# Patient Record
Sex: Female | Born: 1965 | Race: White | Hispanic: No | Marital: Married | Smoking: Never smoker
Health system: Southern US, Community
[De-identification: ages and names within clinical notes are randomized; demographics above are authoritative.]

## PROBLEM LIST (undated history)

## (undated) DIAGNOSIS — F39 Unspecified mood [affective] disorder: Secondary | ICD-10-CM

## (undated) DIAGNOSIS — L409 Psoriasis, unspecified: Secondary | ICD-10-CM

## (undated) DIAGNOSIS — F329 Major depressive disorder, single episode, unspecified: Secondary | ICD-10-CM

## (undated) DIAGNOSIS — F988 Other specified behavioral and emotional disorders with onset usually occurring in childhood and adolescence: Secondary | ICD-10-CM

## (undated) DIAGNOSIS — L4050 Arthropathic psoriasis, unspecified: Secondary | ICD-10-CM

## (undated) DIAGNOSIS — F32A Depression, unspecified: Secondary | ICD-10-CM

## (undated) HISTORY — DX: Depression, unspecified: F32.A

## (undated) HISTORY — DX: Psoriasis, unspecified: L40.9

## (undated) HISTORY — DX: Major depressive disorder, single episode, unspecified: F32.9

## (undated) HISTORY — PX: BUNIONECTOMY: SHX129

## (undated) HISTORY — PX: ENDOMETRIAL ABLATION: SHX621

## (undated) HISTORY — DX: Arthropathic psoriasis, unspecified: L40.50

## (undated) HISTORY — DX: Other specified behavioral and emotional disorders with onset usually occurring in childhood and adolescence: F98.8

---

## 2000-08-11 HISTORY — PX: ABDOMINOPLASTY: SUR9

## 2004-06-24 ENCOUNTER — Other Ambulatory Visit: Admission: RE | Admit: 2004-06-24 | Discharge: 2004-06-24 | Payer: Self-pay | Admitting: Obstetrics and Gynecology

## 2004-10-04 ENCOUNTER — Ambulatory Visit: Payer: Self-pay | Admitting: Internal Medicine

## 2004-11-11 ENCOUNTER — Ambulatory Visit: Payer: Self-pay | Admitting: Internal Medicine

## 2004-12-24 ENCOUNTER — Ambulatory Visit: Payer: Self-pay | Admitting: Internal Medicine

## 2005-01-07 ENCOUNTER — Ambulatory Visit: Payer: Self-pay

## 2005-02-10 ENCOUNTER — Ambulatory Visit: Payer: Self-pay | Admitting: Internal Medicine

## 2005-03-12 ENCOUNTER — Ambulatory Visit (HOSPITAL_COMMUNITY): Admission: RE | Admit: 2005-03-12 | Discharge: 2005-03-12 | Payer: Self-pay | Admitting: Orthopedic Surgery

## 2005-03-12 ENCOUNTER — Ambulatory Visit (HOSPITAL_BASED_OUTPATIENT_CLINIC_OR_DEPARTMENT_OTHER): Admission: RE | Admit: 2005-03-12 | Discharge: 2005-03-12 | Payer: Self-pay | Admitting: Orthopedic Surgery

## 2005-06-02 ENCOUNTER — Ambulatory Visit: Payer: Self-pay | Admitting: Internal Medicine

## 2005-07-25 ENCOUNTER — Ambulatory Visit: Payer: Self-pay | Admitting: Family Medicine

## 2005-08-14 ENCOUNTER — Other Ambulatory Visit: Admission: RE | Admit: 2005-08-14 | Discharge: 2005-08-14 | Payer: Self-pay | Admitting: Obstetrics and Gynecology

## 2006-04-01 ENCOUNTER — Ambulatory Visit: Payer: Self-pay | Admitting: Internal Medicine

## 2006-04-22 ENCOUNTER — Emergency Department (HOSPITAL_COMMUNITY): Admission: EM | Admit: 2006-04-22 | Discharge: 2006-04-23 | Payer: Self-pay | Admitting: *Deleted

## 2006-04-24 ENCOUNTER — Ambulatory Visit: Payer: Self-pay | Admitting: Internal Medicine

## 2006-10-21 ENCOUNTER — Ambulatory Visit: Payer: Self-pay | Admitting: Internal Medicine

## 2006-10-21 LAB — CONVERTED CEMR LAB
AST: 17 units/L (ref 0–37)
Albumin: 3.8 g/dL (ref 3.5–5.2)
Alkaline Phosphatase: 35 units/L — ABNORMAL LOW (ref 39–117)
BUN: 8 mg/dL (ref 6–23)
Basophils Absolute: 0 10*3/uL (ref 0.0–0.1)
Basophils Relative: 0.5 % (ref 0.0–1.0)
CO2: 29 meq/L (ref 19–32)
Chloride: 102 meq/L (ref 96–112)
Creatinine, Ser: 0.7 mg/dL (ref 0.4–1.2)
HCT: 39.1 % (ref 36.0–46.0)
Hemoglobin: 13.5 g/dL (ref 12.0–15.0)
MCHC: 34.5 g/dL (ref 30.0–36.0)
Neutrophils Relative %: 60.7 % (ref 43.0–77.0)
RBC: 4.3 M/uL (ref 3.87–5.11)
RDW: 11.5 % (ref 11.5–14.6)
Sodium: 139 meq/L (ref 135–145)
Total Bilirubin: 0.7 mg/dL (ref 0.3–1.2)
Total Protein: 6.6 g/dL (ref 6.0–8.3)

## 2007-03-05 ENCOUNTER — Encounter: Payer: Self-pay | Admitting: Internal Medicine

## 2007-07-21 ENCOUNTER — Emergency Department (HOSPITAL_COMMUNITY): Admission: EM | Admit: 2007-07-21 | Discharge: 2007-07-21 | Payer: Self-pay | Admitting: Emergency Medicine

## 2007-09-27 ENCOUNTER — Encounter: Payer: Self-pay | Admitting: Internal Medicine

## 2007-09-28 ENCOUNTER — Ambulatory Visit: Payer: Self-pay | Admitting: Internal Medicine

## 2007-09-28 DIAGNOSIS — M949 Disorder of cartilage, unspecified: Secondary | ICD-10-CM

## 2007-09-28 DIAGNOSIS — F4321 Adjustment disorder with depressed mood: Secondary | ICD-10-CM | POA: Insufficient documentation

## 2007-09-28 DIAGNOSIS — R002 Palpitations: Secondary | ICD-10-CM | POA: Insufficient documentation

## 2007-09-28 DIAGNOSIS — L408 Other psoriasis: Secondary | ICD-10-CM | POA: Insufficient documentation

## 2007-09-28 DIAGNOSIS — M899 Disorder of bone, unspecified: Secondary | ICD-10-CM | POA: Insufficient documentation

## 2008-01-10 LAB — CONVERTED CEMR LAB: Pap Smear: NORMAL

## 2008-01-10 LAB — HM MAMMOGRAPHY: HM Mammogram: NORMAL

## 2009-10-10 ENCOUNTER — Ambulatory Visit: Payer: Self-pay | Admitting: Internal Medicine

## 2009-10-10 LAB — CONVERTED CEMR LAB
ALT: 13 units/L (ref 0–35)
BUN: 8 mg/dL (ref 6–23)
Bilirubin Urine: NEGATIVE
Bilirubin, Direct: 0 mg/dL (ref 0.0–0.3)
Blood in Urine, dipstick: NEGATIVE
Chloride: 104 meq/L (ref 96–112)
Cholesterol: 189 mg/dL (ref 0–200)
Creatinine, Ser: 0.6 mg/dL (ref 0.4–1.2)
Eosinophils Absolute: 0.2 10*3/uL (ref 0.0–0.7)
Eosinophils Relative: 5.2 % — ABNORMAL HIGH (ref 0.0–5.0)
HDL: 121.2 mg/dL (ref >39.00)
Ketones, urine, test strip: NEGATIVE
LDL Cholesterol: 50 mg/dL (ref 0–99)
MCV: 96.5 fL (ref 78.0–100.0)
Monocytes Absolute: 0.3 10*3/uL (ref 0.1–1.0)
Neutrophils Relative %: 54 % (ref 43.0–77.0)
Platelets: 179 10*3/uL (ref 150.0–400.0)
Total Bilirubin: 0.2 mg/dL — ABNORMAL LOW (ref 0.3–1.2)
Triglycerides: 87 mg/dL (ref 0.0–149.0)
Urobilinogen, UA: 0.2
VLDL: 17.4 mg/dL (ref 0.0–40.0)
WBC: 3.8 10*3/uL — ABNORMAL LOW (ref 4.5–10.5)

## 2009-10-17 ENCOUNTER — Ambulatory Visit: Payer: Self-pay | Admitting: Internal Medicine

## 2010-01-01 ENCOUNTER — Ambulatory Visit: Payer: Self-pay | Admitting: Internal Medicine

## 2010-09-08 LAB — CONVERTED CEMR LAB
Cholesterol: 165 mg/dL (ref 0–200)
Total CHOL/HDL Ratio: 2
Triglycerides: 48 mg/dL (ref 0–149)

## 2010-09-10 NOTE — Therapy (Signed)
Summary: Hearing Test/[facility]  Hearing Test/[facility]   Imported By: Maryln Gottron 10/19/2009 12:32:03  _____________________________________________________________________  External Attachment:    Type:   Image     Comment:   External Document

## 2010-09-10 NOTE — Assessment & Plan Note (Signed)
Summary: CPX/PAP/NJR   Vital Signs:  Patient profile:   45 year old female Menstrual status:  regular LMP:     08/19/2009 Height:      66.5 inches Weight:      160 pounds BMI:     25.53 Pulse rate:   72 / minute BP sitting:   120 / 80  (right arm) Cuff size:   regular  Vitals Entered By: Romualdo Bolk, CMA (AAMA) (October 17, 2009 11:02 AM) CC: CPX- No pap.  Pt has a gyn who does paps.   Hearing Screen  20db HL: Left  500 hz: 15db 1000 hz: 10db 2000 hz: 10db 4000 hz: 15db Right  500 hz: 15db 1000 hz: 10db 2000 hz: 10db 4000 hz: 10db   Hearing Testing Entered By: Romualdo Bolk, CMA [facility]) (October 17, 2009 1:56 PM) LMP (date): 08/19/2009 LMP - Character: heavy Menarche (age onset years): 14   Menses interval (days): 84 Menstrual flow (days): 7-8 Menstrual Status regular Enter LMP: 08/19/2009 Last PAP Result normal   History of Present Illness: Melissa Walters comesin today for   preventive visit .  Since last visit  here  there have been no major changes in health status  .  GYNE bleeding:  on seasonique  for bleeding  . generally helps.  MOOD:  Still fighting depression but doing much better.  Doing well on meds   doing  better on lamictal   seen in [place].   SOme joint pains  no swelling : has seen  rheum in past no dx  Psoriasis : stable   Concern about concentrating  . Dx add in past but had se of stimulants  see paper record.    hard to remember things quickly.   Preventive Care Screening  Mammogram:    Date:  01/10/2008    Results:  normal   Pap Smear:    Date:  01/10/2008    Results:  normal    Preventive Screening-Counseling & Management  Alcohol-Tobacco     Alcohol drinks/day: 3     Alcohol type: wine     Smoking Status: never  Caffeine-Diet-Exercise     Caffeine use/day: 1-2     Does Patient Exercise: yes  Hep-HIV-STD-Contraception     Dental Visit-last 6 months yes     Sun Exposure-Excessive: no  Safety-Violence-Falls  Seat Belt Use: yes     Smoke Detectors: yes  Current Medications (verified): 1)  Trazodone Hcl 100 Mg Tabs (Trazodone Hcl) .[name].. 1 By Mouth Once Daily 2)  Ambien 10 Mg Tabs (Zolpidem Tartrate) .[name].. 1 By Mouth At Bedtime 3)  Seasonale 0.15-0.03 Mg  Tabs (Levonorgest-Eth Estrad 91-Day) .[name].. 1 By Mouth Once Daily 4)  Lamictal 100 Mg Tabs (Lamotrigine) .... 2 Qd 5)  Trileptal 300 Mg Tabs (Oxcarbazepine) .[name].. 1 By Mouth Once Daily 6)  Alprazolam 0.25 Mg Tabs (Alprazolam)  Allergies (verified): No Known Drug Allergies  Past History:  Past medical, surgical, family and social histories (including risk factors) reviewed, and no changes noted (except as noted below).  Past Medical History:  poss ADD Childbirth G5P4  Psoriasis  Depression  Past Surgical History: bunion surgery bone spur left foot.   Past History:  Care Management: Gynecology: Smitty Pluck. Psychiatry: Wellington Hampshire- in [place] Dermatology: [facility]   muscle tightness  RHem deveshewar consult in the past   Family History: Reviewed history from 09/28/2007 and no changes required. no hx scd  or heart arrythmia  Anxiety Father  DM  glaucoma  MOM   Myasthenia gravis , [place]  Brother:  Devic disease NMO Neuromyelitis Optica  daughter   with Hashimotos.    Sis with psoriasis.   and depression.   Social History: Reviewed history from 09/28/2007 and no changes required. Married non smoker  no sig etoh or caffiene managing  7  hh  pet dog  Regular exercise-yes Drug use-no BA degree.Smoking Status:  never Caffeine use/day:  1-2 Seat Belt Use:  yes Dental Care w/in 6 mos.:  yes Sun Exposure-Excessive:  no  Review of Systems  The patient denies anorexia, fever, weight loss, weight gain, vision loss, hoarseness, chest pain, syncope, dyspnea on exertion, peripheral edema, prolonged cough, headaches, hemoptysis, abdominal pain, melena, hematochezia, severe indigestion/heartburn, hematuria,  incontinence, muscle weakness, transient blindness, difficulty walking, unusual weight change, abnormal bleeding, enlarged lymph nodes, angioedema, and breast masses.         ? decreased hearing compared to others Physical Exam General Appearance: well developed, well nourished, no acute distress Eyes: conjunctiva and lids normal, PERRLA, EOMI, WNL Ears, Nose, Mouth, Throat: TM clear, nares clear, oral exam WNL Neck: supple, no lymphadenopathy, no thyromegaly, no JVD Respiratory: clear to auscultation and percussion, respiratory effort normal Cardiovascular: regular rate and rhythm, S1-S2, no murmur, rub or gallop, no bruits, peripheral pulses normal and symmetric, no cyanosis, clubbing, edema or varicosities Chest: no scars, masses, tenderness; no asymmetry, skin changes, nipple discharge   Gastrointestinal: soft, non-tender; no hepatosplenomegaly, masses; active bowel sounds all quadrants,  Genitourinary: per gyne  Lymphatic: no cervical, axillary or inguinal adenopathy Musculoskeletal: gait normal, muscle tone and strength WNL, no joint swelling, effusions, discoloration, crepitus  bunion left foot   Skin: clear, good turgor, color WNL, no rashes, lesions, or ulcerations  faded psoriasis patches legs   Neurologic: normal mental status, normal reflexes, normal strength, sensation, and motion Psychiatric: alert; oriented to person, place and time Other Exam:  see labs   hearing screen is negative .     Impression & Recommendations:  Problem # 1:  HEALTH MAINTENANCE EXAM, ADULT (ICD-V70.0)  Discussed nutrition,exercise,diet,healthy weight, vitamin D and calcium.   hearing is ok   Orders: Audiometry [phone])  Problem # 2:  PSORIASIS (ICD-696.1) controlled   Problem # 3:  Family Hx of AUTOIMMUNE DISEASE NOT ELSEWHERE  CLASSIFIED (ICD-279.49) strong family hx of autoimmune  disease  no signs currently  has seen rheum in the past who agrees and says "monitor. 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 1 by mouth once daily    Alprazolam 0.25 Mg Tabs (Alprazolam)  Problem # 5:  attention concentration counsleing about this and this may be overload plus ask about meds to Psych   coaching and strategies defined . I dont think this is otherwise new disease of problem.   Complete Medication List: 1)  Trazodone Hcl 100 Mg Tabs (Trazodone hcl) .[name].. 1 by mouth once daily 2)  Ambien 10 Mg Tabs (Zolpidem tartrate) .[name].. 1 by mouth at bedtime 3)  Seasonale 0.15-0.03 Mg Tabs (Levonorgest-eth estrad 91-day) .[name].. 1 by mouth once daily 4)  Lamictal 100 Mg Tabs (Lamotrigine) .... 2 qd 5)  Trileptal 300 Mg Tabs (Oxcarbazepine) .[name].. 1 by mouth once daily 6)  Alprazolam 0.25 Mg Tabs (Alprazolam)  Other Orders:  Tdap => 28yrs IM (40981) Admin 1st Vaccine (19147)  Patient Instructions: 1)  receck if any signs are persistent and progressive   otherwise routine  check.   2)  Hearing with audiology if getting worse.    Immunizations Administered:  Tetanus Vaccine:    Vaccine Type: Tdap    Site: right deltoid    Mfr: GlaxoSmithKline    Dose: 0.5 ml    Route: IM    Given by: Romualdo Bolk, CMA (AAMA)    Exp. Date: 06/06/2010    Lot #: [id number]

## 2010-09-10 NOTE — Assessment & Plan Note (Signed)
Summary: insect bite?/dm   Vital Signs:  Patient profile:   45 year old female Menstrual status:  regular LMP:     12/11/2009 Height:      66.5 inches Weight:      165 pounds BMI:     26.33 Temp:     98.5 degrees F oral Pulse rate:   85 / minute BP sitting:   130 / 90  (right arm)  Vitals Entered By: Kathrynn Speed CMA (Jan 01, 2010 11:44 AM) CC: Inner Lft thigh Insect bite / happen 10 days ago / swelling / itching / Went to Urgent Care Yesterday LMP (date): 12/11/2009 LMP - Character: heavy Menarche (age onset years): 14   Menses interval (days): 84 Menstrual flow (days): 7-8 Enter LMP: 12/11/2009 Last PAP Result normal   History of Present Illness: Melissa Walters comes in today  for above. Was on trip to [place] and awaoke with itdy tender area left thigh .    for about ? 10 days and worse so went to urgent care yesterday and was given doxycycline for infecton .  had been using otc HCS.  has other itcthy spots behind lef t leg and on right forearm.   NO spscific exposures otherwise . No fever  .  Refill seasonale.   had tried off to see if helped her weight gain. didnt so go back on.   tender to over eat at times.  not exercising as much. otherwise ok.   Preventive Screening-Counseling & Management  Alcohol-Tobacco     Alcohol drinks/day: 3     Alcohol type: wine     Smoking Status: never  Caffeine-Diet-Exercise     Caffeine use/day: 1-2     Does Patient Exercise: yes  Current Medications (verified): 1)  Trazodone Hcl 100 Mg Tabs (Trazodone Hcl) .[name].. 1 By Mouth Once Daily 2)  Ambien 10 Mg Tabs (Zolpidem Tartrate) .[name].. 1 By Mouth At Bedtime 3)  Seasonale 0.15-0.03 Mg  Tabs (Levonorgest-Eth Estrad 91-Day) .[name].. 1 By Mouth Once Daily 4)  Lamictal 100 Mg Tabs (Lamotrigine) .... 2 Qd 5)  Trileptal 300 Mg Tabs (Oxcarbazepine) .[name].. 1 By Mouth Once Daily 6)  Alprazolam 0.25 Mg Tabs (Alprazolam) 7)  Doxycycline Hyclate 100 Mg Caps (Doxycycline Hyclate) .[name].. 1 Capsule 2 X A  Day  Allergies (verified): 1)  ! * Compezene  Past History:  Past medical, surgical, family and social histories (including risk factors) reviewed for relevance to current acute and chronic problems.  Past Medical History: Reviewed history from 10/17/2009 and no changes required.  poss ADD Childbirth G5P4  Psoriasis  Depression  Past Surgical History: Reviewed history from 10/17/2009 and no changes required. bunion surgery bone spur left foot.   Family History: Reviewed history from 10/17/2009 and no changes required. no hx scd  or heart arrythmia  Anxiety Father  DM  glaucoma  MOM   Myasthenia gravis , [place]  Brother:  Devic disease NMO Neuromyelitis Optica  daughter   with Hashimotos.    Sis with psoriasis.   and depression.   Social History: Reviewed history from 10/17/2009 and no changes required. Married non smoker  no sig etoh or caffiene managing  7  hh  pet dog  Regular exercise-yes Drug use-no BA degree.  Review of Systems  The patient denies anorexia, fever, enlarged lymph nodes, angioedema, difficulty walking, and abnormal bleeding.    Physical Exam  General:  Well-developed,well-nourished,in no acute distress; alert,appropriate and cooperative throughout examination Head:  normocephalic and atraumatic.  Neck:  No deformities, masses, or tenderness noted. Pulses:  nl cpa refill  Extremities:  no clubbing cyanosis or edema  Neurologic:  nonf ocal  Skin:  3-4 cm erythematous  ovoid with trailing linear  rash on left thigh with some faint erytjhema surrounding it  center is indurated but no abscess fluctance  . right leg with papaular urticarila tyle lesions and some linear dermatitis .  no vescices of blisters.   Psych:  Oriented X3, not anxious appearing, and subdued.     Impression & Recommendations:  Problem # 1:  ? of INSECT BITE, INFECTED (ICD-919.5) distribution  of redness is unusual and looks almost contact like    but larege area of redness  and induration noted   .  no abscess will maintain on docycycline  Problem # 2:  ? of CONTACT DERMATITIS (ICD-692.9) looks almost like pI     with linear   rashs ? if koebner  phenom but no psoriasis seen.  Her updated medication list for this problem includes:    Fluocinonide 0.05 % Crea (Fluocinonide) .[name]... Apply to rash two times a day  Problem # 3:  ORAL CONTRACEPTION (ICD-V25.41) no sig se   restart    med.    Problem # 4:  weight concerns  counseled  calendar and interventions.   Complete Medication List: 1)  Trazodone Hcl 100 Mg Tabs (Trazodone hcl) .[name].. 1 by mouth once daily 2)  Ambien 10 Mg Tabs (Zolpidem tartrate) .[name].. 1 by mouth at bedtime 3)  Seasonale 0.15-0.03 Mg Tabs (Levonorgest-eth estrad 91-day) .[name].. 1 by mouth once daily 4)  Lamictal 100 Mg Tabs (Lamotrigine) .... 2 qd 5)  Trileptal 300 Mg Tabs (Oxcarbazepine) .[name].. 1 by mouth once daily 6)  Alprazolam 0.25 Mg Tabs (Alprazolam) 7)  Doxycycline Hyclate 100 Mg Caps (Doxycycline hyclate) .[name].. 1 capsule 2 x a day 8)  Fluocinonide 0.05 % Crea (Fluocinonide) .... Apply to rash two times a day  Patient Instructions: 1)  continue  antibioitc  2)  topical cortisone to other itchy places  seems like contact dermatitis or bug bite reaction. 3)  call if fever   or as needed. Prescriptions: FLUOCINONIDE 0.05 % CREA (FLUOCINONIDE) apply to rash two times a day  #30 gm x 1   Entered and Authorized by:   Madelin Headings MD   Signed by:   Madelin Headings MD on 01/01/2010   Method used:   Electronically to        [facility]. [phone]* (retail)       [phone] [address]       [place], [place]  [address]       Ph: [phone]       Fax: [phone]   RxID:   [id number] SEASONALE 0.15-0.03 MG  TABS (LEVONORGEST-ETH ESTRAD 91-DAY) 1 by mouth once daily  #1 x 1   Entered and Authorized by:   Madelin Headings MD   Signed by:   Madelin Headings MD on 01/01/2010   Method used:   Electronically to         [facility]. [phone]* (retail)       [phone] [address].       [place], [place]  [address]       Ph: [phone]       Fax: [phone]   RxID:   [id number]

## 2010-10-02 ENCOUNTER — Other Ambulatory Visit (HOSPITAL_COMMUNITY): Payer: Self-pay

## 2010-10-03 ENCOUNTER — Ambulatory Visit (HOSPITAL_COMMUNITY)
Admission: RE | Admit: 2010-10-03 | Discharge: 2010-10-03 | Disposition: A | Discharge status: Home / Self Care | Payer: BC Managed Care – PPO | Source: Ambulatory Visit | Attending: Obstetrics and Gynecology | Admitting: Obstetrics and Gynecology

## 2010-10-03 ENCOUNTER — Other Ambulatory Visit: Payer: Self-pay | Admitting: Obstetrics and Gynecology

## 2010-10-03 DIAGNOSIS — N920 Excessive and frequent menstruation with regular cycle: Secondary | ICD-10-CM | POA: Insufficient documentation

## 2010-10-03 LAB — CBC
HCT: 37.4 % (ref 36.0–46.0)
Hemoglobin: 12.9 g/dL (ref 12.0–15.0)
MCH: 31.1 pg (ref 26.0–34.0)
MCHC: 34.5 g/dL (ref 30.0–36.0)
MCV: 90.1 fL (ref 78.0–100.0)
RDW: 11.9 % (ref 11.5–15.5)

## 2010-10-14 NOTE — Op Note (Signed)
  [name], Melissa Walters              ACCOUNT NO.:  [id number]  MEDICAL RECORD NO.:  [id number]           PATIENT TYPE:  O  LOCATION:  [facility]                          [facility]  PHYSICIAN:  Carrington Clamp, M.D. DATE OF BIRTH:  1966/02/17  DATE OF PROCEDURE:  10/03/2010 DATE OF DISCHARGE:                              OPERATIVE REPORT   PREOPERATIVE DIAGNOSIS:  Menorrhagia.  POSTOPERATIVE DIAGNOSIS:  Menorrhagia.  PROCEDURE:  Dilation and curettage with hysteroscopy and NovaSure ablation.  SURGEON:  Carrington Clamp, MD  ASSISTANT:  None.  ANESTHESIA:  General LMA.  FINDINGS:  Scant endometrium and no other abnormalities of the uterine cavity.  SPECIMENS:  Uterine curettings.  ESTIMATED BLOOD LOSS:  Minimal.  IV FLUIDS:  500 mL.  URINE OUTPUT:  Not measured.  COMPLICATIONS:  None except a single stitch of 2-0 Vicryl that was placed in the cervix to stop bleeding from the tenaculum site.  Counts were correct x3.  TECHNIQUE:  After adequate general anesthesia was achieved, the patient was prepped and draped in the usual sterile fashion in dorsal lithotomy position.  The  bladder was emptied with a red rubber catheter and a speculum was placed in the vagina.  Single-tooth tenaculum was used to stabilize the cervix.  The cervix was dilated with Shawnie Pons dilators.  The cervix was measured at 3 cm and sounding length of 9 for a cavity length of 6 cm.  The hysteroscope was passed inside the uterine cavity with minimal findings.  A curettage was then performed and the NovaSure instrument was then introduced.  A cavity width of 5 cm was achieved in a successful procedure of a power of 165 for 30 seconds was performed. The NovaSure instrument was removed and the hysteroscope was passed inside and noted that there was good contact of all the endometrial surfaces.  The instruments were drawn from the uterine cavity and single- tooth tenaculum was taken off the cervix.  There  was some bleeding of the single-tooth tenaculum site which was then taken care of with a single stitch of 2-0 Vicryl on the CT-1.  All instruments were gently withdrawn from vagina.  The patient tolerated the procedure well and was returned to recovery room in stable condition.     Carrington Clamp, M.D.     MH/MEDQ  D:  10/03/2010  T:  10/04/2010  Job:  [id number]  Electronically Signed by Carrington Clamp MD on 10/14/2010 08:28:33 AM

## 2010-12-27 NOTE — Op Note (Signed)
[name], [name]              ACCOUNT NO.:  [id number]   MEDICAL RECORD NO.:  [id number]          PATIENT TYPE:  AMB   LOCATION:  [facility]                          FACILITY:  [facility]   PHYSICIAN:  Leonides Grills, M.D.     DATE OF BIRTH:  1965/10/06   DATE OF PROCEDURE:  03/12/2005  DATE OF DISCHARGE:                                 OPERATIVE REPORT   PREOPERATIVE DIAGNOSIS:  Left hallux rigidus.   POSTOPERATIVE DIAGNOSIS:  Left hallux rigidus.   OPERATION:  Left great toe cheilectomy.   SURGEON:  Leonides Grills, M.D.   ANESTHESIA:  Allegra Lai, S.P.C.   ESTIMATED BLOOD LOSS:  Minimal.   TOURNIQUET TIME:  Approximately one-half hour.   COMPLICATIONS:  None.   DISPOSITION:  Stable to the PAR.   INDICATIONS FOR PROCEDURE:  __________  with her __________ . She was  consented for the above procedure.  All risks, which include infection,  nerve or vessel injury, persistent pain, worsening pain, stiffness or  arthritis, prolonged recovery and possible future fusion were all explained  and the questions were encouraged and answered.   DESCRIPTION OF PROCEDURE:  The patient was brought to the operating room and  placed in the supine position.  After adequate general endotracheal tube  anesthesia was administered, as well as Ancef 1 g IV piggyback, the left  lower extremity was then prepped and draped in a sterile manner over a  proximally placed thigh tourniquet.  The limb was gravity exsanguinated.  The tourniquet was elevated to 290 mmHg.  A longitudinal incision over the  dorsal aspect of the left great toe MTP joint was then made.  Dissection was  carried down through skin.  Hemostasis was obtained.  A capsulotomy was then  made just medial to the EHL tendon.  The EHL tendon sheath was now violated  with capsulitis as well.  The spur was dorsally based and there was about  half the joint that was arthritic over the metatarsal head.  There was also  a spur off the dorsal aspect of  the base of the proximal phalanx as well.  Both spurs were removed with a sagittal saw as well as a rongeur.  Once the  area was copiously irrigated with normal saline and dried off, bone wax was  applied to  exposed bony surfaces.  The capsule was closed with #3-0  Vicryl sutures.  The tourniquet was deflated.  Hemostasis was obtained.  The  skin was closed with #4-0 nylon sutures.  A sterile dressing was applied.  Hard soled shoes were applied.   The patient was sterile to the PAR.       PB/MEDQ  D:  03/12/2005  T:  03/12/2005  Job:  [id number]

## 2011-08-14 ENCOUNTER — Other Ambulatory Visit: Payer: Self-pay | Admitting: Dermatology

## 2011-08-26 ENCOUNTER — Encounter: Payer: Self-pay | Admitting: Internal Medicine

## 2011-09-02 ENCOUNTER — Other Ambulatory Visit (INDEPENDENT_AMBULATORY_CARE_PROVIDER_SITE_OTHER): Payer: BC Managed Care – PPO

## 2011-09-02 DIAGNOSIS — Z0000 Encounter for general adult medical examination without abnormal findings: Secondary | ICD-10-CM

## 2011-09-02 LAB — HEPATIC FUNCTION PANEL
ALT: 14 U/L (ref 0–35)
AST: 19 U/L (ref 0–37)
Alkaline Phosphatase: 70 U/L (ref 39–117)
Total Bilirubin: 0.4 mg/dL (ref 0.3–1.2)

## 2011-09-02 LAB — CBC WITH DIFFERENTIAL/PLATELET
Basophils Relative: 1.1 % (ref 0.0–3.0)
Lymphocytes Relative: 32 % (ref 12.0–46.0)
MCHC: 34.6 g/dL (ref 30.0–36.0)
MCV: 93.8 fl (ref 78.0–100.0)
Monocytes Absolute: 0.5 10*3/uL (ref 0.1–1.0)
Neutrophils Relative %: 51.3 % (ref 43.0–77.0)
RBC: 3.88 Mil/uL (ref 3.87–5.11)
RDW: 12.7 % (ref 11.5–14.6)

## 2011-09-02 LAB — POCT URINALYSIS DIPSTICK
Glucose, UA: NEGATIVE
Leukocytes, UA: NEGATIVE
Nitrite, UA: POSITIVE
Spec Grav, UA: 1.02
Urobilinogen, UA: 0.2

## 2011-09-02 LAB — BASIC METABOLIC PANEL
BUN: 13 mg/dL (ref 6–23)
CO2: 27 mEq/L (ref 19–32)
Calcium: 9.1 mg/dL (ref 8.4–10.5)
Chloride: 98 mEq/L (ref 96–112)
Creatinine, Ser: 0.7 mg/dL (ref 0.4–1.2)
Glucose, Bld: 88 mg/dL (ref 70–99)

## 2011-09-02 LAB — LIPID PANEL
Cholesterol: 216 mg/dL — ABNORMAL HIGH (ref 0–200)
Total CHOL/HDL Ratio: 2

## 2011-09-03 LAB — LDL CHOLESTEROL, DIRECT: Direct LDL: 59.9 mg/dL

## 2011-09-09 ENCOUNTER — Encounter: Payer: Self-pay | Admitting: Internal Medicine

## 2011-09-09 ENCOUNTER — Ambulatory Visit (INDEPENDENT_AMBULATORY_CARE_PROVIDER_SITE_OTHER): Payer: BC Managed Care – PPO | Admitting: Internal Medicine

## 2011-09-09 VITALS — BP 122/80 | HR 84 | Ht 66.5 in | Wt 176.0 lb

## 2011-09-09 DIAGNOSIS — F32A Depression, unspecified: Secondary | ICD-10-CM | POA: Insufficient documentation

## 2011-09-09 DIAGNOSIS — F329 Major depressive disorder, single episode, unspecified: Secondary | ICD-10-CM

## 2011-09-09 DIAGNOSIS — Z0000 Encounter for general adult medical examination without abnormal findings: Secondary | ICD-10-CM

## 2011-09-09 DIAGNOSIS — L409 Psoriasis, unspecified: Secondary | ICD-10-CM | POA: Insufficient documentation

## 2011-09-09 NOTE — Progress Notes (Signed)
Subjective:    Patient ID: Melissa Walters, female    DOB: 19-Jul-1966, 46 y.o.   MRN: [id number]  HPI Patient comes in today for Preventive Health Care visit  Since last visit Had left bunion surgery. Getting personal trainer and exercising but hard time losing weight tends to miss breakfast and  Lunch busy family life . Needs form for  Insurance  No OSA sometime snores.   Review of Systems ROS:  GEN/ HEENTNo fever, significant weight changes sweats headaches vision problems hearing slight decrease changes, CV/ PULM; No chest pain shortness of breath cough, syncope,edema  change in exercise tolerance. GI /GU: No adominal pain, vomiting, change in bowel habits. No blood in the stool. No significant GU symptoms. SKIN/HEME: ,no acute skin rashes suspicious lesions or bleeding. No lymphadenopathy, nodules, masses. Sees  Dr Danella Deis for psoriasis. NEURO/ PSYCH:  No neurologic signs such as weakness numbness IMM/ Allergy: No unusual infections.  Allergy .   REST of 12 system review negative   Past history family history social history reviewed in the electronic medical record. Past Medical History  Diagnosis Date  . ADD (attention deficit disorder)     poss  . Depression   . Psoriasis     History   Social History  . Marital Status: Married    Spouse Name: N/A    Number of Children: N/A  . Years of Education: N/A   Occupational History  . Not on file.   Social History Main Topics  . Smoking status: Never Smoker   . Smokeless tobacco: Not on file  . Alcohol Use: No  . Drug Use: Yes  . Sexually Active: Not on file   Other Topics Concern  . Not on file   Social History Narrative   MarriedNo caffeineManaging 7 HH Pet dogRegular exercise- yesBA degree    Past Surgical History  Procedure Date  . Bunionectomy     bone spur left foot    Family History  Problem Relation Age of Onset  . Anxiety disorder    . Diabetes Father   . Glaucoma Father   . Myasthenia gravis  Mother   . Other Mother     [place]  . Other Brother     devic disease NMO neuromyelitis optica  . Hashimoto's thyroiditis Daughter   . Psoriasis Sister   . Depression Sister     Allergies  Allergen Reactions  . Compazine Swelling    Throat swelling    Current Outpatient Prescriptions on File Prior to Visit  Medication Sig Dispense Refill  . lamoTRIgine (LAMICTAL) 100 MG tablet Take 200 mg by mouth daily.      . Oxcarbazepine (TRILEPTAL) 300 MG tablet Take 300 mg by mouth daily.      . traZODone (DESYREL) 100 MG tablet Take 100 mg by mouth at bedtime.      [name] zolpidem (AMBIEN) 10 MG tablet Take 10 mg by mouth at bedtime as needed.        BP 122/80  Pulse 84  Ht 5' 6.5" (1.689 m)  Wt 176 lb (79.833 kg)  BMI 27.98 kg/m2        Objective:   Physical Exam Physical Exam: Vital signs reviewed WUJ:WJXB is a well-developed well-nourished alert cooperative  white female who appears her stated age in no acute distress.  HEENT: normocephalic atraumatic , Eyes: PERRL EOM's full, conjunctiva clear, Nares: paten,t no deformity discharge or tenderness., Ears: no deformity EAC's clear TMs with normal landmarks. Mouth: clear OP,  no lesions, edema.  Moist mucous membranes. Dentition in adequate repair. NECK: supple without masses, thyromegaly or bruits. CHEST/PULM:  Clear to auscultation and percussion breath sounds equal no wheeze , rales or rhonchi. No chest wall deformities or tenderness. CV: PMI is nondisplaced, S1 S2 no gallops, murmurs, rubs. Peripheral pulses are full without delay.No JVD .  ABDOMEN: Bowel sounds normal nontender  No guard or rebound, no hepato splenomegal no CVA tenderness.  No hernia. Extremtities:  No clubbing cyanosis or edema, no acute joint swelling or redness no focal atrophy NEURO:  Oriented x3, cranial nerves 3-12 appear to be intact, no obvious focal weakness,gait within normal limits no abnormal reflexes or asymmetrical SKIN: No acute rashes normal turgor,  color, no bruising or petechiae. Small patch psoriasis leg  PSYCH: Oriented, good eye contact, no obvious depression anxiety, cognition and judgment appear normal. LN: no cervical axillary inguinal adenopathy Lab Results  Component Value Date   WBC 5.0 09/02/2011   HGB 12.6 09/02/2011   HCT 36.3 09/02/2011   PLT 165.0 09/02/2011   GLUCOSE 88 09/02/2011   CHOL 216* 09/02/2011   TRIG 54.0 09/02/2011   HDL 127.90 09/02/2011   LDLDIRECT 59.9 09/02/2011   LDLCALC 50 10/10/2009   ALT 14 09/02/2011   AST 19 09/02/2011   NA 134* 09/02/2011   K 4.7 09/02/2011   CL 98 09/02/2011   CREATININE 0.7 09/02/2011   BUN 13 09/02/2011   CO2 27 09/02/2011   TSH 1.00 09/02/2011         Assessment & Plan:  Preventive Health Care Counseled regarding healthy nutrition, exercise, sleep, injury prevention, calcium vit d and healthy weight .  Form signed for  [facility].  Flu shot Depression under rx   Stable  Psoriasis   Under care no ob complications Concern about  Weight. Counseled.    ADD NOTE : Pt was  Mistakenly given tdap instead of flu shot.by  CMA.   Flu shot given other arm given also by CMA . Told patient that she may have and increase reaction to the arm but should be safe otherwise. Call us if any concerns about this.   Lorretta Harp MD

## 2011-09-09 NOTE — Patient Instructions (Signed)
Continue lifestyle intervention healthy eating and exercise . Your labs are good. Sleep hygiene and dont skip breakfast lunch intake can be  200 calorie with protein content.  3500 calories is the energy content of a pound of body weight .Must have a 3500 cal deficit to lose one pound . Thus decrease 500 calorie equivalent per day in food or drink intake / or exercise  for 7 days to lose one pound.

## 2012-09-13 ENCOUNTER — Telehealth: Payer: Self-pay | Admitting: Internal Medicine

## 2012-09-13 NOTE — Telephone Encounter (Signed)
It is better for me to see her first and do labs at the [facility] because lab testing related to  arthritis is not very precise . There are many tests to consider .  Sometimes we get rheumatology opinion.

## 2012-09-13 NOTE — Telephone Encounter (Signed)
Patient notified by telephone. 

## 2012-09-13 NOTE — Telephone Encounter (Signed)
Pt states she is having severe arthritic pain.  Appt scheduled to see PCP Thursday 09/16/12.  However pt would like to have labs done prior to appt.  Requesting order to check for rheumatoid arthritis.

## 2012-09-16 ENCOUNTER — Encounter: Payer: Self-pay | Admitting: Internal Medicine

## 2012-09-16 ENCOUNTER — Ambulatory Visit (INDEPENDENT_AMBULATORY_CARE_PROVIDER_SITE_OTHER): Payer: BC Managed Care – PPO | Admitting: Internal Medicine

## 2012-09-16 VITALS — BP 126/86 | HR 82 | Temp 98.4°F | Wt 172.0 lb

## 2012-09-16 DIAGNOSIS — Z299 Encounter for prophylactic measures, unspecified: Secondary | ICD-10-CM

## 2012-09-16 DIAGNOSIS — M2550 Pain in unspecified joint: Secondary | ICD-10-CM | POA: Insufficient documentation

## 2012-09-16 DIAGNOSIS — L408 Other psoriasis: Secondary | ICD-10-CM

## 2012-09-16 DIAGNOSIS — M2560 Stiffness of unspecified joint, not elsewhere classified: Secondary | ICD-10-CM

## 2012-09-16 DIAGNOSIS — L409 Psoriasis, unspecified: Secondary | ICD-10-CM

## 2012-09-16 LAB — CBC WITH DIFFERENTIAL/PLATELET
Basophils Relative: 1.6 % (ref 0.0–3.0)
Hemoglobin: 12.2 g/dL (ref 12.0–15.0)
Lymphocytes Relative: 30.7 % (ref 12.0–46.0)
Monocytes Relative: 10.2 % (ref 3.0–12.0)
Neutro Abs: 2 10*3/uL (ref 1.4–7.7)
Neutrophils Relative %: 51.7 % (ref 43.0–77.0)
RBC: 3.87 Mil/uL (ref 3.87–5.11)
WBC: 4 10*3/uL — ABNORMAL LOW (ref 4.5–10.5)

## 2012-09-16 LAB — BASIC METABOLIC PANEL
CO2: 28 mEq/L (ref 19–32)
Calcium: 8.9 mg/dL (ref 8.4–10.5)
Creatinine, Ser: 0.9 mg/dL (ref 0.4–1.2)
GFR: 75.79 mL/min (ref >60.00)
Sodium: 130 mEq/L — ABNORMAL LOW (ref 135–145)

## 2012-09-16 LAB — HEPATIC FUNCTION PANEL
AST: 15 U/L (ref 0–37)
Albumin: 4.2 g/dL (ref 3.5–5.2)
Alkaline Phosphatase: 78 U/L (ref 39–117)
Bilirubin, Direct: 0 mg/dL (ref 0.0–0.3)
Total Protein: 6.9 g/dL (ref 6.0–8.3)

## 2012-09-16 LAB — IBC PANEL
Iron: 62 ug/dL (ref 42–145)
Transferrin: 266.5 mg/dL (ref 212.0–360.0)

## 2012-09-16 LAB — TSH: TSH: 0.28 u[IU]/mL — ABNORMAL LOW (ref 0.35–5.50)

## 2012-09-16 LAB — LIPID PANEL: VLDL: 13 mg/dL (ref 0.0–40.0)

## 2012-09-16 LAB — FERRITIN: Ferritin: 59.4 ng/mL (ref 10.0–291.0)

## 2012-09-16 LAB — LDL CHOLESTEROL, DIRECT: Direct LDL: 51 mg/dL

## 2012-09-16 NOTE — Patient Instructions (Signed)
Will arrange   Updated opinion about the joint stiffness  . Will notify you  of labs when available. Uncertain if this is an inflammatory process or degenerative and ? If psoriatic arthritis could be a cause or not.  You will be contacted about referral appt.

## 2012-09-16 NOTE — Progress Notes (Signed)
Chief Complaint  Patient presents with  . Joint Pain    Ongoing for awhile.  Pain has increased lately.  She is noticing changes in the way her fingers and toes look.    HPI: Patient comes in today for   Worsening problem evaluation. Joint pains for about 6 years had RA  Evaluation cause at that time had borderline number  Pre electronic . Was felt to be no inflammatory a problem But since that time  Noted more stiffness  Am and most of day   Hand stiffness and throught out hard to hold ski pole.  Knees and neck.  A problem No fever.  No acute swelling but enlargening of pip joint areas   No numbness  Family hx arthitis both parents   ?DJD.    Has psoriasis on scalp. And gets in winter  And legs.  Sees derm  Had  Bunion toe fusion surgery last year  Left foot and noted advanced arthritis  . Per surgeon dr hewitt  ROS: See pertinent positives and negatives per HPI. No colitis sx gi conditions .  Some weight gain recently she is addressing from holidays.  Neg fam celiac.   Past Medical History  Diagnosis Date  . ADD (attention deficit disorder)     poss  . Depression   . Psoriasis     Family History  Problem Relation Age of Onset  . Anxiety disorder    . Diabetes Father   . Glaucoma Father   . Myasthenia gravis Mother   . Other Mother     [place]  . Other Brother     devic disease NMO neuromyelitis optica  . Hashimoto's thyroiditis Daughter   . Psoriasis Sister   . Depression Sister   . Arthritis      parents     History   Social History  . Marital Status: Married    Spouse Name: N/A    Number of Children: N/A  . Years of Education: N/A   Social History Main Topics  . Smoking status: Never Smoker   . Smokeless tobacco: None  . Alcohol Use: No  . Drug Use: Yes  . Sexually Active: None   Other Topics Concern  . None   Social History Narrative   MarriedNo caffeineManaging 7 HH Pet dogRegular exercise- yesBA degree    Outpatient Encounter Prescriptions as of  09/16/2012  Medication Sig Dispense Refill  . lamoTRIgine (LAMICTAL) 100 MG tablet Take 200 mg by mouth daily.      . Oxcarbazepine (TRILEPTAL) 300 MG tablet Take 300 mg by mouth daily.      . traZODone (DESYREL) 100 MG tablet Take 100 mg by mouth at bedtime.      [name] zolpidem (AMBIEN) 10 MG tablet Take 10 mg by mouth at bedtime as needed.        EXAM:  BP 126/86  Pulse 82  Temp 98.4 F (36.9 C) (Oral)  Wt 172 lb (78.019 kg)  SpO2 97%  There is no height on file to calculate BMI.  GENERAL: vitals reviewed and listed above, alert, oriented, appears well hydrated and in no acute distress  HEENT: atraumatic, conjunctiva  clear, no obvious abnormalities on inspection of external nose and ears OP : no lesion edema or exudate  NECK: no obvious masses on inspection palpation  LUNGS: clear to auscultation bilaterally, no wheezes, rales or rhonchi, good air movement CV: HRRR, no clubbing cyanosis or  peripheral edema nl cap refill  Skin  Few psoriasis patche son legs   MS: moves all extremities  Hands  Mild inc pip right hand not really a sausage digit  Feet no redness scar left bunion area toes some deformity and separation . [place].   PSYCH: pleasant and cooperative,   ASSESSMENT AND PLAN:  Discussed the following assessment and plan:  1. Morning joint stiffness  Basic metabolic panel, CBC with Differential, Hepatic function panel, IBC panel, Ferritin, ANA, Cyclic citrul peptide antibody, IgG, Sedimentation rate  2. Psoriasis  Basic metabolic panel, CBC with Differential, Hepatic function panel, IBC panel, Ferritin, ANA, Cyclic citrul peptide antibody, IgG, Sedimentation rate  3. Multiple joint pain  Basic metabolic panel, CBC with Differential, Hepatic function panel, IBC panel, Ferritin, ANA, Cyclic citrul peptide antibody, IgG, Sedimentation rate  4. Preventive measure  Basic metabolic panel, CBC with Differential, Hepatic function panel, Lipid panel, TSH, IBC panel, Ferritin, ANA,  Cyclic citrul peptide antibody, IgG, Sedimentation rate    -Patient advised to return or notify health care team  if symptoms worsen or persist or new concerns arise.  Patient Instructions  Will arrange   Updated opinion about the joint stiffness  . Will notify you  of labs when available. Uncertain if this is an inflammatory process or degenerative and ? If psoriatic arthritis could be a cause or not.  You will be contacted about referral appt.     Melissa Walters. Melissa Walters M.D.

## 2012-09-17 LAB — ANA: Anti Nuclear Antibody(ANA): NEGATIVE

## 2012-09-21 ENCOUNTER — Telehealth: Payer: Self-pay | Admitting: Internal Medicine

## 2012-09-21 DIAGNOSIS — R7989 Other specified abnormal findings of blood chemistry: Secondary | ICD-10-CM

## 2012-09-21 NOTE — Telephone Encounter (Signed)
Pt requesting results of labs.

## 2012-09-21 NOTE — Telephone Encounter (Signed)
See result note  inform pt that labs are ok and negative arhtritis serology but her thyroid test is abnormal and may show  Over activity .  Still see the rheumatologist .  And  Repeat   tsh with free t4 free t3  thyroid stimulating antibodies  . And thyroid antibody panel.   Dx abnormal tsh

## 2012-09-22 ENCOUNTER — Other Ambulatory Visit: Payer: Self-pay | Admitting: Internal Medicine

## 2012-09-22 DIAGNOSIS — R7989 Other specified abnormal findings of blood chemistry: Secondary | ICD-10-CM

## 2012-09-22 NOTE — Telephone Encounter (Signed)
Patient notified by telephone. 

## 2012-09-29 ENCOUNTER — Other Ambulatory Visit (INDEPENDENT_AMBULATORY_CARE_PROVIDER_SITE_OTHER): Payer: BC Managed Care – PPO

## 2012-09-29 ENCOUNTER — Telehealth: Payer: Self-pay | Admitting: Internal Medicine

## 2012-09-29 DIAGNOSIS — R232 Flushing: Secondary | ICD-10-CM

## 2012-09-29 DIAGNOSIS — N951 Menopausal and female climacteric states: Secondary | ICD-10-CM

## 2012-09-29 LAB — FOLLICLE STIMULATING HORMONE: FSH: 71.1 m[IU]/mL

## 2012-09-29 LAB — TSH: TSH: 0.41 u[IU]/mL (ref 0.35–5.50)

## 2012-09-29 LAB — T3, FREE: T3, Free: 2.2 pg/mL — ABNORMAL LOW (ref 2.3–4.2)

## 2012-09-29 NOTE — Telephone Encounter (Signed)
Pt would like premenopausal test if one is available, since she is having labs done anyway today. Pls advise.

## 2012-09-30 LAB — PROLACTIN: Prolactin: 9.1 ng/mL

## 2012-10-09 DIAGNOSIS — L4050 Arthropathic psoriasis, unspecified: Secondary | ICD-10-CM

## 2012-10-09 HISTORY — DX: Arthropathic psoriasis, unspecified: L40.50

## 2012-10-14 ENCOUNTER — Telehealth: Payer: Self-pay | Admitting: Internal Medicine

## 2012-10-14 NOTE — Telephone Encounter (Signed)
Please call pt about lab results on 2/19.

## 2012-10-16 NOTE — Telephone Encounter (Signed)
pls advise

## 2012-10-16 NOTE — Telephone Encounter (Signed)
Report what is in the result note to her .  Thanks

## 2012-10-18 ENCOUNTER — Other Ambulatory Visit: Payer: Self-pay | Admitting: Family Medicine

## 2012-10-18 DIAGNOSIS — R7989 Other specified abnormal findings of blood chemistry: Secondary | ICD-10-CM

## 2012-10-18 NOTE — Telephone Encounter (Signed)
Patient notified of results by telephone. 

## 2012-11-05 ENCOUNTER — Ambulatory Visit: Payer: BC Managed Care – PPO | Admitting: Internal Medicine

## 2012-11-09 ENCOUNTER — Ambulatory Visit (INDEPENDENT_AMBULATORY_CARE_PROVIDER_SITE_OTHER): Payer: BC Managed Care – PPO | Admitting: Internal Medicine

## 2012-11-09 ENCOUNTER — Encounter: Payer: Self-pay | Admitting: Internal Medicine

## 2012-11-09 VITALS — BP 122/82 | HR 65 | Temp 98.1°F | Resp 10 | Ht 67.0 in | Wt 179.0 lb

## 2012-11-09 DIAGNOSIS — R7989 Other specified abnormal findings of blood chemistry: Secondary | ICD-10-CM

## 2012-11-09 DIAGNOSIS — R6889 Other general symptoms and signs: Secondary | ICD-10-CM

## 2012-11-09 NOTE — Progress Notes (Signed)
Subjective:     Patient ID: Melissa Walters, female   DOB: 1966-03-31, 47 y.o.   MRN: [id number]  HPI Melissa Walters is a pleasant 47 year old woman, referred by her PCP, Dr. Fabian Sharp, for a low TSH on 09/16/2012.  [name], since 2008, however, 2 months ago, [name], of 0.28. A repeat set of thyroid labs on 09/29/2012, [name], [name] Of note, her prolactin was normal at that time at 9.1, and she had a high FSH, at 71.  TSH  Date Value Range Status  09/29/2012 0.41  0.35 - 5.50 uIU/mL Final  09/16/2012 0.28* 0.35 - 5.50 uIU/mL Final  09/02/2011 1.00  0.35 - 5.50 uIU/mL Final  10/10/2009 0.63  0.35-5.50 microintl units/mL Final  09/28/2007 0.59  0.35-5.50 microintl units/mL Final  10/21/2006 0.55  0.35-5.50 microintl units/mL Final  Denies po 6 or intra-articular steroids, no increased iodine in diet, no recent contrast studies. 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  She has weight gain (35-40 lbs in the past 5 years, since started the psychotropic medications; but had lost 35 lbs before; she again lost 20 lbs last year, before the holidays), also has fatigue and hair falling. She does have scalp psoriasis, which has improved recently after she was on a protein diet before the holidays. She exercises daily, walking 3x a week and and doing "boot camp" 2x a week. Patient denies palpitations, tremors, increased his stool numbers or other signs of hyperthyroidism. She does admit to anxiety and depression, which are not new.  Daughter has Hashimoto's, dx at 49 y/o. Her sister also have hypothyroidism, not on meds. She also has psoriatic arthritis. Has FH of MS, Devic's ds., psoriasis, Myastenia Gravis.   I reviewed her chart including office notes, telephone notes, labs, and available scanned  records. Of note, patient had hyponatremia at 130 on 09/16/2012, previously 134, with otherwise normal BMP (probably caused by Trileptal +/- Lamictal). Potassium level was normal, at 4.3. Her chloride was 97 (96-112). Her eosinophiles were also high, at 5.8% (0-5%) and have been previously high at 5.5% and 5.2% dating back 3 years ago. Her LFTs are normal. Her cholesterol level was 214/65/134/61, ESR 9 per last check.  Review of Systems Constitutional: + weight gain, + fatigue, + hot flashes, plus increased appetite, plus poor sleep Eyes: no blurry vision, no xerophthalmia ENT: no sore throat, no nodules palpated in throat, no dysphagia/odynophagia, no hoarseness Cardiovascular: no CP/SOB/palpitations/leg swelling Respiratory: + cough/no SOB Gastrointestinal: no N/V/D/+C, + GERD Musculoskeletal: has muscle/joint aches Skin: no rashes, + hair loss Neurological: no tremors/numbness/tingling/dizziness, occasional headaches Psychiatric: no depression/anxiety Low libido  Past Medical History  Diagnosis Date  . ADD (attention deficit disorder)     poss  . Depression   . Psoriasis   . Psoriatic arthritis 10/2012   Past Surgical History  Procedure Laterality Date  . Bunionectomy      bone spur left foot  . Endometrial ablation     History   Social History  . Marital Status: Married    Spouse Name: N/A    Number of Children: 4: 64, 45, 38, 70 y/o  . Years of Education: N/A   Occupational History  . homemaker   Social History Main Topics  . Smoking status: Never Smoker   .  Smokeless tobacco: Not on file  . Alcohol Use: Yes, wine, most days, 2-3 drinks  . Drug Use: no   Social History Narrative   Married   No caffeine   Managing 7 HH    Pet dog   Regular exercise- yes   BA degree   Current Outpatient Prescriptions on File Prior to Visit  Medication Sig Dispense Refill  . lamoTRIgine (LAMICTAL) 100 MG tablet Take 200 mg by mouth daily.      . Oxcarbazepine (TRILEPTAL) 300  MG tablet Take 300 mg by mouth daily.      . traZODone (DESYREL) 100 MG tablet Take 100 mg by mouth at bedtime.      [name] zolpidem (AMBIEN) 10 MG tablet Take 10 mg by mouth at bedtime as needed.       No current facility-administered medications on file prior to visit.   Allergies  Allergen Reactions  . Compazine Swelling    Throat swelling   Family History  Problem Relation Age of Onset  . Anxiety disorder    . Diabetes Father   . Glaucoma Father   . Myasthenia gravis Mother   . Other Mother     [place]  . Other Brother     devic disease NMO neuromyelitis optica  . Hashimoto's thyroiditis Daughter   . Psoriasis Sister   . Depression Sister   . Arthritis      parents    Objective:   Physical Exam BP 122/82  Pulse 65  Temp(Src) 98.1 F (36.7 C) (Oral)  Resp 10  Ht 5\' 7"  (1.702 m)  Wt 179 lb (81.194 kg)  BMI 28.03 kg/m2  SpO2 97% Constitutional: overweight, in NAD Eyes: PERRLA, EOMI, no exophthalmos ENT: moist mucous membranes, no thyromegaly, no cervical lymphadenopathy Cardiovascular: RRR, No MRG Respiratory: CTA B Gastrointestinal: abdomen soft, NT, ND, BS+ Musculoskeletal: no deformities, strength intact in all 4 Skin: moist, warm, no rashes Neurological: no tremor with outstretched hands, DTR normal in all 4 Wt Readings from Last 3 Encounters:  11/09/12 179 lb (81.194 kg)  09/16/12 172 lb (78.019 kg)  09/09/11 176 lb (79.833 kg)   Assessment:     1. Low TSH value - x1, resolved - normal fT4  - on Oxcarbazepine (Trileptal)  2. Hyponatremia - Likely from Trileptal +/- Lamictal    Plan:     I had a long discussion with the patient about possible causes of a low TSH value, with consideration for possible lab error, since only one of her TSH levels in the last 6 years have been abnormal. The patient has been on oxcarbazepine for at least 5 years, and this is a medication that has been recognized to suppress the TSH, causing central hypothyroidism. In the setting  of just 1 low TSH level, I would only continue to monitor her thyroid function tests for now.  - She does not appear to have other exogenous causes for low TSH (no steroids, no increased iodine in diet, no recent contrast studies), no recent episodes of upper respiratory infection, no nodules palpated, but she still has the risk of Graves' disease, as she has an extensive family history of autoimmune disorders, and a personal history of psoriasis. She does not have any symptoms right now to indicate hyperthyroidism, though. - I would recommend repeat the thyroid tests (TSH, free T4, free T3) every year to make sure that they do not become permanently abnormal - It is possible that her weight gain was caused by her psychotropic  medication or by being recently post menopausal, but her free T4 is in the normal range, unlikely to cause weight gain  - I offered the patient either see her back in a year, or she can have tests in her PCPs office every year, and return if they become abnormal. Patient would like to continue to see her PCP, and return to me on a prn basis.

## 2012-11-09 NOTE — Patient Instructions (Signed)
Please continue to have your thyroid tests checked every year. Please return to me if they are abnormal.

## 2012-11-19 ENCOUNTER — Other Ambulatory Visit: Payer: BC Managed Care – PPO

## 2013-06-17 ENCOUNTER — Other Ambulatory Visit: Payer: Self-pay | Admitting: Obstetrics and Gynecology

## 2013-08-11 HISTORY — PX: ENDOMETRIAL ABLATION: SHX621

## 2013-08-15 ENCOUNTER — Encounter: Payer: Self-pay | Admitting: Internal Medicine

## 2013-08-15 ENCOUNTER — Ambulatory Visit (INDEPENDENT_AMBULATORY_CARE_PROVIDER_SITE_OTHER): Payer: BC Managed Care – PPO | Admitting: Internal Medicine

## 2013-08-15 VITALS — BP 130/80 | HR 69 | Temp 98.5°F | Wt 171.0 lb

## 2013-08-15 DIAGNOSIS — M79601 Pain in right arm: Secondary | ICD-10-CM | POA: Insufficient documentation

## 2013-08-15 DIAGNOSIS — L4050 Arthropathic psoriasis, unspecified: Secondary | ICD-10-CM | POA: Insufficient documentation

## 2013-08-15 DIAGNOSIS — J029 Acute pharyngitis, unspecified: Secondary | ICD-10-CM

## 2013-08-15 DIAGNOSIS — M79609 Pain in unspecified limb: Secondary | ICD-10-CM

## 2013-08-15 NOTE — Progress Notes (Signed)
Pre visit review using our clinic review tool, if applicable. No additional management support is needed unless otherwise documented below in the visit note. 

## 2013-08-15 NOTE — Patient Instructions (Signed)
This is probably  Viral but if family positive strep and   contact us.    Otherwise symptomatic treat ment as you are doing   Sore Throat A sore throat is pain, burning, irritation, or scratchiness of the throat. There is often pain or tenderness when swallowing or talking. A sore throat may be accompanied by other symptoms, such as coughing, sneezing, fever, and swollen neck glands. A sore throat is often the first sign of another sickness, such as a cold, flu, strep throat, or mononucleosis (commonly known as mono). Most sore throats go away without medical treatment. CAUSES  The most common causes of a sore throat include:  A viral infection, such as a cold, flu, or mono.  A bacterial infection, such as strep throat, tonsillitis, or whooping cough.  Seasonal allergies.  Dryness in the air.  Irritants, such as smoke or pollution.  Gastroesophageal reflux disease (GERD). HOME CARE INSTRUCTIONS   Only take over-the-counter medicines as directed by your caregiver.  Drink enough fluids to keep your urine clear or pale yellow.  Rest as needed.  Try using throat sprays, lozenges, or sucking on hard candy to ease any pain (if older than 4 years or as directed).  Sip warm liquids, such as broth, herbal tea, or warm water with honey to relieve pain temporarily. You may also eat or drink cold or frozen liquids such as frozen ice pops.  Gargle with salt water (mix 1 tsp salt with 8 oz of water).  Do not smoke and avoid secondhand smoke.  Put a cool-mist humidifier in your bedroom at night to moisten the air. You can also turn on a hot shower and sit in the bathroom with the door closed for 5 10 minutes. SEEK IMMEDIATE MEDICAL CARE IF:  You have difficulty breathing.  You are unable to swallow fluids, soft foods, or your saliva.  You have increased swelling in the throat.  Your sore throat does not get better in 7 days.  You have nausea and vomiting.  You have a fever or  persistent symptoms for more than 2 3 days.  You have a fever and your symptoms suddenly get worse. MAKE SURE YOU:   Understand these instructions.  Will watch your condition.  Will get help right away if you are not doing well or get worse. Document Released: 09/04/2004 Document Revised: 07/14/2012 Document Reviewed: 04/04/2012 [facility] Patient Information 2014 [place], [place].

## 2013-08-15 NOTE — Progress Notes (Signed)
Chief Complaint  Patient presents with  . Sore Throat    HPI: Patient comes in today for SDA for  new problem evaluation. Awoke with sore thrioat and no other sx   tyelnol .  Better   Onset about 3-4 days ago. She's also here with her daughters had a two-week illness that is infectious mono diagnosed.  Also has a question about her right arm sensitivity pain does comfort from her neck to her hand feels weak but no specific injury to see Dr. Dareen PianoAnderson next week for her psoriatic arthritis.  Low sodium on Trileptal.  This was stopped ROS: See pertinent positives and negatives per HPI.  Past Medical History  Diagnosis Date  . ADD (attention deficit disorder)     poss  . Depression   . Psoriasis   . Psoriatic arthritis 10/2012    Family History  Problem Relation Age of Onset  . Anxiety disorder    . Diabetes Father   . Glaucoma Father   . Myasthenia gravis Mother   . Other Mother     [place]  . Other Brother     devic disease NMO neuromyelitis optica  . Hashimoto's thyroiditis Daughter   . Psoriasis Sister   . Depression Sister   . Arthritis      parents     History   Social History  . Marital Status: Married    Spouse Name: N/A    Number of Children: N/A  . Years of Education: N/A   Social History Main Topics  . Smoking status: Never Smoker   . Smokeless tobacco: None  . Alcohol Use: No  . Drug Use: Yes  . Sexual Activity: None   Other Topics Concern  . None   Social History Narrative   Married   No caffeine   Managing 7 HH    Pet dog   Regular exercise- yes   BA degree          Outpatient Encounter Prescriptions as of 08/15/2013  Medication Sig  . gabapentin (NEURONTIN) 300 MG capsule   . lamoTRIgine (LAMICTAL) 100 MG tablet Take 200 mg by mouth daily.  . Oxcarbazepine (TRILEPTAL) 300 MG tablet Take 300 mg by mouth daily.  . traZODone (DESYREL) 100 MG tablet Take 100 mg by mouth at bedtime.  [name]. zolpidem (AMBIEN) 10 MG tablet Take 10 mg by mouth at  bedtime as needed.    EXAM:  BP 130/80  Pulse 69  Temp(Src) 98.5 F (36.9 C) (Oral)  Wt 171 lb (77.565 kg)  SpO2 96%  Body mass index is 26.78 kg/(m^2).  GENERAL: vitals reviewed and listed above, alert, oriented, appears well hydrated and in no acute distress HEENT: atraumatic, conjunctiva  clear, no obvious abnormalities on inspection of external nose and ears OP : no lesion edema or exudate mild erythema  NECK: no obvious masses on inspection palpation no significant adenopathy CV: HRRR, no clubbing cyanosis  MS: moves all extremities without noticeable focal  abnormality grip strength 5 out of 5 no acute joint swelling right upper extremity DTR present  PSYCH: pleasant and cooperative, no obvious depression or anxiety  ASSESSMENT AND PLAN:  Discussed the following assessment and plan:  Sore throat - daughter has mono one had e nodosum while back trouble he viral will reevaluate if recurs or strep test positive in  family. prefers no throat swab today  Right arm pain - seems like pinched nerve  has psoariatic arthritis  [facility]  dr Dareen Piano also  Psoriatic arthritis  -Patient advised to return or notify health care team  if symptoms worsen or persist or new concerns arise.  Patient Instructions  This is probably  Viral but if family positive strep and   contact us.    Otherwise symptomatic treat ment as you are doing   Sore Throat A sore throat is pain, burning, irritation, or scratchiness of the throat. There is often pain or tenderness when swallowing or talking. A sore throat may be accompanied by other symptoms, such as coughing, sneezing, fever, and swollen neck glands. A sore throat is often the first sign of another sickness, such as a cold, flu, strep throat, or mononucleosis (commonly known as mono). Most sore throats go away without medical treatment. CAUSES  The most common causes of a sore throat include:  A viral infection, such as a  cold, flu, or mono.  A bacterial infection, such as strep throat, tonsillitis, or whooping cough.  Seasonal allergies.  Dryness in the air.  Irritants, such as smoke or pollution.  Gastroesophageal reflux disease (GERD). HOME CARE INSTRUCTIONS   Only take over-the-counter medicines as directed by your caregiver.  Drink enough fluids to keep your urine clear or pale yellow.  Rest as needed.  Try using throat sprays, lozenges, or sucking on hard candy to ease any pain (if older than 4 years or as directed).  Sip warm liquids, such as broth, herbal tea, or warm water with honey to relieve pain temporarily. You may also eat or drink cold or frozen liquids such as frozen ice pops.  Gargle with salt water (mix 1 tsp salt with 8 oz of water).  Do not smoke and avoid secondhand smoke.  Put a cool-mist humidifier in your bedroom at night to moisten the air. You can also turn on a hot shower and sit in the bathroom with the door closed for 5 10 minutes. SEEK IMMEDIATE MEDICAL CARE IF:  You have difficulty breathing.  You are unable to swallow fluids, soft foods, or your saliva.  You have increased swelling in the throat.  Your sore throat does not get better in 7 days.  You have nausea and vomiting.  You have a fever or persistent symptoms for more than 2 3 days.  You have a fever and your symptoms suddenly get worse. MAKE SURE YOU:   Understand these instructions.  Will watch your condition.  Will get help right away if you are not doing well or get worse. Document Released: 09/04/2004 Document Revised: 07/14/2012 Document Reviewed: 04/04/2012 [facility] Patient Information 2014 [place], [place].      Neta Mends. Amberlie Gaillard M.D.

## 2013-12-15 ENCOUNTER — Other Ambulatory Visit (INDEPENDENT_AMBULATORY_CARE_PROVIDER_SITE_OTHER): Payer: BC Managed Care – PPO

## 2013-12-15 DIAGNOSIS — Z79899 Other long term (current) drug therapy: Secondary | ICD-10-CM

## 2013-12-15 DIAGNOSIS — Z0000 Encounter for general adult medical examination without abnormal findings: Secondary | ICD-10-CM

## 2013-12-15 LAB — CBC WITH DIFFERENTIAL/PLATELET
BASOS ABS: 0 10*3/uL (ref 0.0–0.1)
Basophils Relative: 1.1 % (ref 0.0–3.0)
EOS ABS: 0.1 10*3/uL (ref 0.0–0.7)
Eosinophils Relative: 4.4 % (ref 0.0–5.0)
HEMATOCRIT: 38.6 % (ref 36.0–46.0)
HEMOGLOBIN: 13 g/dL (ref 12.0–15.0)
LYMPHS ABS: 1.1 10*3/uL (ref 0.7–4.0)
Lymphocytes Relative: 34.3 % (ref 12.0–46.0)
MCHC: 33.6 g/dL (ref 30.0–36.0)
MCV: 91.8 fl (ref 78.0–100.0)
MONO ABS: 0.3 10*3/uL (ref 0.1–1.0)
Monocytes Relative: 10.4 % (ref 3.0–12.0)
NEUTROS ABS: 1.7 10*3/uL (ref 1.4–7.7)
Neutrophils Relative %: 49.8 % (ref 43.0–77.0)
Platelets: 167 10*3/uL (ref 150.0–400.0)
RBC: 4.21 Mil/uL (ref 3.87–5.11)
RDW: 12.9 % (ref 11.5–15.5)
WBC: 3.3 10*3/uL — ABNORMAL LOW (ref 4.0–10.5)

## 2013-12-15 LAB — BASIC METABOLIC PANEL
BUN: 13 mg/dL (ref 6–23)
CALCIUM: 9.1 mg/dL (ref 8.4–10.5)
CO2: 27 meq/L (ref 19–32)
Chloride: 101 mEq/L (ref 96–112)
Creatinine, Ser: 0.7 mg/dL (ref 0.4–1.2)
GFR: 97.54 mL/min (ref >60.00)
GLUCOSE: 86 mg/dL (ref 70–99)
POTASSIUM: 3.9 meq/L (ref 3.5–5.1)
SODIUM: 134 meq/L — AB (ref 135–145)

## 2013-12-15 LAB — LIPID PANEL
CHOL/HDL RATIO: 2
Cholesterol: 250 mg/dL — ABNORMAL HIGH (ref 0–200)
LDL CALC: 87 mg/dL (ref 0–99)
TRIGLYCERIDES: 56 mg/dL (ref 0.0–149.0)
VLDL: 11.2 mg/dL (ref 0.0–40.0)

## 2013-12-15 LAB — HEPATIC FUNCTION PANEL
ALBUMIN: 4.3 g/dL (ref 3.5–5.2)
ALT: 17 U/L (ref 0–35)
AST: 20 U/L (ref 0–37)
Alkaline Phosphatase: 57 U/L (ref 39–117)
Bilirubin, Direct: 0 mg/dL (ref 0.0–0.3)
Total Bilirubin: 0.6 mg/dL (ref 0.2–1.2)
Total Protein: 6.9 g/dL (ref 6.0–8.3)

## 2013-12-15 LAB — FOLATE: FOLATE: 10.3 ng/mL (ref >5.9)

## 2013-12-15 LAB — TSH: TSH: 0.29 u[IU]/mL — AB (ref 0.35–4.50)

## 2013-12-15 LAB — VITAMIN B12: Vitamin B-12: 237 pg/mL (ref 211–911)

## 2013-12-16 LAB — VITAMIN D 25 HYDROXY (VIT D DEFICIENCY, FRACTURES): Vit D, 25-Hydroxy: 35 ng/mL (ref 30–89)

## 2013-12-16 LAB — CARBAMAZEPINE LEVEL, TOTAL: CARBAMAZEPINE LVL: 9.2 ug/mL (ref 4.0–12.0)

## 2013-12-17 LAB — CARBAMAZEPINE, FREE AND TOTAL
CARBAMAZEPINE FREE: 1.4 ug/mL (ref 1.0–3.0)
Carbamazepine Metabolite -: 1.6 ug/mL (ref 0.2–2.0)
Carbamazepine Metabolite/: 0.8 ug/mL
Carbamazepine Metabolite: 0.8 ug/mL (ref 0.1–1.0)
Carbamazepine, Bound: 6 ug/mL
Carbamazepine, Total: 7.4 ug/mL (ref 4.0–12.0)

## 2013-12-21 ENCOUNTER — Encounter: Payer: BC Managed Care – PPO | Admitting: Internal Medicine

## 2013-12-22 ENCOUNTER — Other Ambulatory Visit: Payer: Self-pay | Admitting: Family Medicine

## 2013-12-22 DIAGNOSIS — R7989 Other specified abnormal findings of blood chemistry: Secondary | ICD-10-CM

## 2013-12-26 ENCOUNTER — Encounter: Payer: Self-pay | Admitting: Internal Medicine

## 2013-12-26 ENCOUNTER — Ambulatory Visit (INDEPENDENT_AMBULATORY_CARE_PROVIDER_SITE_OTHER): Payer: BC Managed Care – PPO | Admitting: Internal Medicine

## 2013-12-26 VITALS — BP 156/106 | Temp 98.8°F | Ht 67.0 in | Wt 185.0 lb

## 2013-12-26 DIAGNOSIS — R5383 Other fatigue: Secondary | ICD-10-CM

## 2013-12-26 DIAGNOSIS — Z0000 Encounter for general adult medical examination without abnormal findings: Secondary | ICD-10-CM

## 2013-12-26 DIAGNOSIS — R5381 Other malaise: Secondary | ICD-10-CM

## 2013-12-26 DIAGNOSIS — R05 Cough: Secondary | ICD-10-CM

## 2013-12-26 DIAGNOSIS — E789 Disorder of lipoprotein metabolism, unspecified: Secondary | ICD-10-CM

## 2013-12-26 DIAGNOSIS — R946 Abnormal results of thyroid function studies: Secondary | ICD-10-CM

## 2013-12-26 DIAGNOSIS — M79675 Pain in left toe(s): Secondary | ICD-10-CM | POA: Insufficient documentation

## 2013-12-26 DIAGNOSIS — M79609 Pain in unspecified limb: Secondary | ICD-10-CM

## 2013-12-26 DIAGNOSIS — R7989 Other specified abnormal findings of blood chemistry: Secondary | ICD-10-CM

## 2013-12-26 DIAGNOSIS — E538 Deficiency of other specified B group vitamins: Secondary | ICD-10-CM | POA: Insufficient documentation

## 2013-12-26 DIAGNOSIS — R059 Cough, unspecified: Secondary | ICD-10-CM | POA: Insufficient documentation

## 2013-12-26 DIAGNOSIS — L4050 Arthropathic psoriasis, unspecified: Secondary | ICD-10-CM

## 2013-12-26 DIAGNOSIS — Z634 Disappearance and death of family member: Secondary | ICD-10-CM

## 2013-12-26 NOTE — Progress Notes (Signed)
Chief Complaint  Patient presents with  . Annual Exam    HPI: Patient comes in today for Preventive Health Care visit  Has form to sign also  Since last visit had labs and change in psych meds  . still tired all the time and gaining weight hard to lose  Father died in past months . 5 weeks ago had injury to toe ( stepped on ) still tenderness and bothering her  Limiting getting back in to running Health Maintenance  Topic Date Due  . Influenza Vaccine  03/11/2014  . Pap Smear  06/17/2016  . Tetanus/tdap  09/08/2021   Health Maintenance Review ROS: see hpi  GEN/ HEENT: No fever, significant weight changes sweats headaches vision problems hearing changes, CV/ PULM; No chest pain shortness of breath , syncope,edema  change in exercise tolerance. Gest cough after eating for a while not assoc with hb but doesn't have some at times ? If related to gluten as gets better with high protein diet. GI /GU: No adominal pain, vomiting, change in bowel habits. No blood in the stool. No significant GU symptoms. SKIN/HEME: ,no acute skin rashes suspicious lesions or bleeding. No lymphadenopathy, nodules, masses.  NEURO/ PSYCH:  No neurologic signs such as weakness numbness.  IMM/ Allergy: No unusual infections.  Allergy .   REST of 12 system review negative except as per HPI   Past Medical History  Diagnosis Date  . ADD (attention deficit disorder)     poss  . Depression   . Psoriasis   . Psoriatic arthritis 10/2012    Family History  Problem Relation Age of Onset  . Anxiety disorder    . Diabetes Father   . Glaucoma Father   . Myasthenia gravis Mother   . Other Mother     [place]  . Other Brother     devic disease NMO neuromyelitis optica  . Hashimoto's thyroiditis Daughter   . Psoriasis Sister   . Depression Sister   . Arthritis      parents     History   Social History  . Marital Status: Married    Spouse Name: N/A    Number of Children: N/A  . Years of Education: N/A    Social History Main Topics  . Smoking status: Never Smoker   . Smokeless tobacco: None  . Alcohol Use: No  . Drug Use: Yes  . Sexual Activity: None   Other Topics Concern  . None   Social History Narrative   Married   No caffeine   Managing 7 HH    Pet dog   Regular exercise- yes   BA degree          Outpatient Encounter Prescriptions as of 12/26/2013  Medication Sig  . diazepam (VALIUM) 2 MG tablet   . EQUETRO 200 MG CP12 12 hr capsule Take 200 mg by mouth daily.   [name]. lamoTRIgine (LAMICTAL) 100 MG tablet Take 200 mg by mouth daily.  . naltrexone (DEPADE) 50 MG tablet Take 50 mg by mouth daily.   . traZODone (DESYREL) 100 MG tablet Take 100 mg by mouth at bedtime.  [name]. zolpidem (AMBIEN) 5 MG tablet Take 5 mg by mouth at bedtime as needed for sleep.  . [DISCONTINUED] gabapentin (NEURONTIN) 300 MG capsule   . [DISCONTINUED] Oxcarbazepine (TRILEPTAL) 300 MG tablet Take 300 mg by mouth daily.  . [DISCONTINUED] zolpidem (AMBIEN) 10 MG tablet Take 10 mg by mouth at bedtime as needed.    EXAM:  BP 156/106  Temp(Src) 98.8 F (37.1 C) (Oral)  Ht 5\' 7"  (1.702 m)  Wt 185 lb (83.915 kg)  BMI 28.97 kg/m2  Body mass index is 28.97 kg/(m^2).  Physical Exam: Vital signs reviewed FAO:ZHYQGEN:This is a well-developed well-nourished alert cooperative    who appearsr stated age in no acute distress.  HEENT: normocephalic atraumatic , Eyes: PERRL EOM's full, conjunctiva clear, Nares: paten,t no deformity discharge or tenderness., Ears: no deformity EAC's clear TMs with normal landmarks. Mouth: clear OP, no lesions, edema.  Moist mucous membranes. Dentition in adequate repair. NECK: supple without masses,  Thyroid palpated no bruits. CHEST/PULM:  Clear to auscultation and percussion breath sounds equal no wheeze , rales or rhonchi. No chest wall deformities or tenderness. Breast: normal by inspection . No dimpling, discharge, masses, tenderness or discharge . CV: PMI is nondisplaced, S1 S2 no  gallops, murmurs, rubs. Peripheral pulses are full without delay.No JVD .  ABDOMEN: Bowel sounds normal nontender  No guard or rebound, no hepato splenomegal no CVA tenderness.  No hernia. Extremtities:  No clubbing cyanosis or edema,  Left 4 toe sausage swelling mild tendenress no skin changes NEURO:  Oriented x3, cranial nerves 3-12 appear to be intact, no obvious focal weakness,gait within normal limits no abnormal reflexes or asymmetrical SKIN: No acute rashes normal turgor, color, no bruising or petechiae. PSYCH: Oriented, good eye contact, appears somewhat depressed or subdued , cognition and judgment appear normal. LN: no cervical axillary inguinal adenopathy  Lab Results  Component Value Date   WBC 3.3* 12/15/2013   HGB 13.0 12/15/2013   HCT 38.6 12/15/2013   PLT 167.0 12/15/2013   GLUCOSE 86 12/15/2013   CHOL 250* 12/15/2013   TRIG 56.0 12/15/2013   HDL 151.80 Verified by manual dilution. 12/15/2013   LDLDIRECT 51.0 09/16/2012   LDLCALC 87 12/15/2013   ALT 17 12/15/2013   AST 20 12/15/2013   NA 134* 12/15/2013   K 3.9 12/15/2013   CL 101 12/15/2013   CREATININE 0.7 12/15/2013   BUN 13 12/15/2013   CO2 27 12/15/2013   TSH 0.29* 12/15/2013    ASSESSMENT AND PLAN:  Discussed the following assessment and plan:  Visit for preventive health examination - form signed utd  Abnormal TSH - waxing adn waning tsh low nl t4 in past   Psoriatic arthritis - Plan: DG Toe 4th Left  Elevated serum cholesterol high HDL  Low vitamin B12 level - in200 range nl mcv lower wbc check mma - Plan: Methylmalonic acid, serum, CANCELED: Methylmalonic acid(mma), rnd urine  Toe pain, left - injury check x ray  5 weeks outinterfereing with acitivy - Plan: DG Toe 4th Left  Tired - multiple causes possible   Cough after eating - ? if elr  Death of family member - father Check tsh fr t3 free t4 and mma  Patient Care Team: Madelin HeadingsWanda K Panosh, MD as PCP - General Cherly Hensenhristopher B Aiken, MD (Psychiatry) [facility] Bjorn LoserMarsha Gruber, MD as  Attending Physician (Dermatology) Levi AlandMark E Anderson, MD as Attending Physician (Obstetrics and Gynecology) Patient Instructions  Labs today thyroid and vit b12 metabolites  Continue lifestyle intervention healthy eating and exercise . Can get x ray of left toe .  Cholesterol is better  Cough after eating could be atypical reflux and less likely food allergy .    Neta MendsWanda K. Panosh M.D. Pre visit review using our clinic review tool, if applicable. No additional management support is needed unless otherwise documented below in the visit note.

## 2013-12-26 NOTE — Patient Instructions (Addendum)
Labs today thyroid and vit b12 metabolites  Continue lifestyle intervention healthy eating and exercise . Can get x ray of left toe .  Cholesterol is better  Cough after eating could be atypical reflux and less likely food allergy .

## 2014-01-03 ENCOUNTER — Other Ambulatory Visit: Payer: BC Managed Care – PPO

## 2014-01-04 ENCOUNTER — Telehealth: Payer: Self-pay | Admitting: Internal Medicine

## 2014-01-04 ENCOUNTER — Ambulatory Visit (INDEPENDENT_AMBULATORY_CARE_PROVIDER_SITE_OTHER)
Admission: RE | Admit: 2014-01-04 | Discharge: 2014-01-04 | Disposition: A | Discharge status: Home / Self Care | Payer: BC Managed Care – PPO | Source: Ambulatory Visit | Attending: Internal Medicine | Admitting: Internal Medicine

## 2014-01-04 DIAGNOSIS — M79609 Pain in unspecified limb: Secondary | ICD-10-CM

## 2014-01-04 DIAGNOSIS — M79675 Pain in left toe(s): Secondary | ICD-10-CM

## 2014-01-04 DIAGNOSIS — L4050 Arthropathic psoriasis, unspecified: Secondary | ICD-10-CM

## 2014-01-04 NOTE — Telephone Encounter (Signed)
Per [facility], can come back in for follow up or send to sports medicine.

## 2014-01-04 NOTE — Telephone Encounter (Signed)
Pt was to know if the result are back from her x-ray that was done this morning.

## 2014-01-04 NOTE — Telephone Encounter (Signed)
Patient notified of normal results.  Continues to have pain and swelling.  Would like to know what to do next.  Please advise.  Thanks!

## 2014-01-05 NOTE — Telephone Encounter (Signed)
Left a message on home/work for the pt to return my call. 

## 2014-01-06 NOTE — Telephone Encounter (Signed)
Left a message on home/work for the pt to return my call.

## 2014-01-09 NOTE — Telephone Encounter (Signed)
Left a message at the below listed number for the pt to return my call. 

## 2014-01-10 NOTE — Telephone Encounter (Signed)
Left a message at the below listed number for the pt to return my call. 

## 2014-01-12 NOTE — Telephone Encounter (Signed)
Spoke to the pt.  She is no longer in pain but has numbness in the toe.  Does not wish for a referral or to come in at this time.  Will continue to monitor it at home and will make an appt if necessary.

## 2014-03-27 ENCOUNTER — Encounter: Payer: Self-pay | Admitting: Physician Assistant

## 2014-03-27 ENCOUNTER — Ambulatory Visit (INDEPENDENT_AMBULATORY_CARE_PROVIDER_SITE_OTHER): Payer: BC Managed Care – PPO | Admitting: Physician Assistant

## 2014-03-27 VITALS — BP 110/80 | HR 80 | Temp 98.7°F | Resp 18 | Wt 181.0 lb

## 2014-03-27 DIAGNOSIS — J0100 Acute maxillary sinusitis, unspecified: Secondary | ICD-10-CM

## 2014-03-27 DIAGNOSIS — T148XXA Other injury of unspecified body region, initial encounter: Secondary | ICD-10-CM

## 2014-03-27 MED ORDER — DOXYCYCLINE HYCLATE 100 MG PO TABS: 100.0000 mg | ORAL_TABLET | Freq: Two times a day (BID) | ORAL | Status: DC

## 2014-03-27 NOTE — Progress Notes (Signed)
Pre visit review using our clinic review tool, if applicable. No additional management support is needed unless otherwise documented below in the visit note. 

## 2014-03-27 NOTE — Patient Instructions (Addendum)
Continue to apply heat over the hematoma on your knee. This may take several weeks to fully resolve.  Plain Over the Counter Mucinex (NOT Mucinex D) for thick secretions  Force NON dairy fluids, drinking plenty of water is best.    Over the Counter Flonase OR Nasacort AQ 1 spray in each nostril twice a day as needed. Use the "crossover" technique into opposite nostril spraying toward opposite ear @ 45 degree angle, not straight up into nostril.   Plain Over the Counter Allegra (NOT D )  160 daily , OR Loratidine 10 mg , OR Zyrtec 10 mg @ bedtime  as needed for itchy eyes & sneezing.  Saline Irrigation and Saline Sprays can also help reduce symptoms.  If your symptoms are worsening through Thursday, you can fill the prescription for doxycycline. Doxycycline twice daily for 10 days. Take with food to prevent nausea.  If emergency symptoms discussed during visit developed, seek medical attention immediately.  Followup as needed, or for worsening or persistent symptoms despite treatment. Sinusitis Sinusitis is redness, soreness, and puffiness (inflammation) of the air pockets in the bones of your face (sinuses). The redness, soreness, and puffiness can cause air and mucus to get trapped in your sinuses. This can allow germs to grow and cause an infection.  HOME CARE   Drink enough fluids to keep your pee (urine) clear or pale yellow.  Use a humidifier in your home.  Run a hot shower to create steam in the bathroom. Sit in the bathroom with the door closed. Breathe in the steam 3-4 times a day.  Put a warm, moist washcloth on your face 3-4 times a day, or as told by your doctor.  Use salt water sprays (saline sprays) to wet the thick fluid in your nose. This can help the sinuses drain.  Only take medicine as told by your doctor. GET HELP RIGHT AWAY IF:   Your pain gets worse.  You have very bad headaches.  You are sick to your stomach (nauseous).  You throw up (vomit).  You are  very sleepy (drowsy) all the time.  Your face is puffy (swollen).  Your vision changes.  You have a stiff neck.  You have trouble breathing. MAKE SURE YOU:   Understand these instructions.  Will watch your condition.  Will get help right away if you are not doing well or get worse. Document Released: 01/14/2008 Document Revised: 04/21/2012 Document Reviewed: 03/02/2012 [facility] Patient Information 2015 [employer], [place]. This information is not intended to replace advice given to you by your health care provider. Make sure you discuss any questions you have with your health care provider. Hematoma A hematoma is a collection of blood. The collection of blood can turn into a hard, painful lump under the skin. Your skin may turn blue or yellow if the hematoma is close to the surface of the skin. Most hematomas get better in a few days to weeks. Some hematomas are serious and need medical care. Hematomas can be very small or very big. HOME CARE  Apply ice to the injured area:  Put ice in a plastic bag.  Place a towel between your skin and the bag.  Leave the ice on for 20 minutes, 2-3 times a day for the first 1 to 2 days.  After the first 2 days, switch to using warm packs on the injured area.  Raise (elevate) the injured area to lessen pain and puffiness (swelling). You may also wrap the area with an  elastic bandage. Make sure the bandage is not wrapped too tight.  If you have a painful hematoma on your leg or foot, you may use crutches for a couple days.  Only take medicines as told by your doctor. GET HELP RIGHT AWAY IF:   Your pain gets worse.  Your pain is not controlled with medicine.  You have a fever.  Your puffiness gets worse.  Your skin turns more blue or yellow.  Your skin over the hematoma breaks or starts bleeding.  Your hematoma is in your chest or belly (abdomen) and you are short of breath, feel weak, or have a change in consciousness.  Your hematoma  is on your scalp and you have a headache that gets worse or a change in alertness or consciousness. MAKE SURE YOU:   Understand these instructions.  Will watch your condition.  Will get help right away if you are not doing well or get worse. Document Released: 09/04/2004 Document Revised: 03/30/2013 Document Reviewed: 01/05/2013 [facility] Patient Information 2015 [place], [place]. This information is not intended to replace advice given to you by your health care provider. Make sure you discuss any questions you have with your health care provider.

## 2014-03-27 NOTE — Progress Notes (Signed)
Subjective:    Patient ID: Melissa Walters, female    DOB: [birth year], 48 y.o.   MRN: [id number]  Sinusitis This is a new problem. The current episode started in the past 7 days (5 days). The problem has been rapidly worsening since onset. There has been no fever. Her pain is at a severity of 6/10. The pain is moderate. Associated symptoms include chills, congestion, coughing, ear pain, headaches, a hoarse voice, sinus pressure (causing jaw pain) and a sore throat. Pertinent negatives include no diaphoresis, neck pain, shortness of breath, sneezing or swollen glands. Treatments tried: sudafed, tylenol, zantac. The treatment provided mild relief.    Patient also asked about an area on her left knee that looks like a bruise. This happened after she fell approximately 5 weeks. She has been using heat and ice over the area to try to help it, and it has resolved some, however still present. She denies any pain in the area or any loss of ROM. She denies any gait abnormalities, sensation abnormalities, decrease strength or weakness.  Review of Systems  Constitutional: Positive for chills. Negative for fever and diaphoresis.  HENT: Positive for congestion, ear pain, hoarse voice, postnasal drip, sinus pressure (causing jaw pain) and sore throat. Negative for sneezing.   Respiratory: Positive for cough. Negative for shortness of breath.   Cardiovascular: Negative for chest pain.  Gastrointestinal: Negative for nausea, vomiting and diarrhea.  Musculoskeletal: Negative for neck pain.  Skin: Negative for rash.  Neurological: Positive for headaches. Negative for syncope.  All other systems reviewed and are negative.  Past Medical History  Diagnosis Date  . ADD (attention deficit disorder)     poss  . Depression   . Psoriasis   . Psoriatic arthritis 10/2012    History   Social History  . Marital Status: Married    Spouse Name: N/A    Number of Children: N/A  . Years of Education: N/A    Occupational History  . Not on file.   Social History Main Topics  . Smoking status: Never Smoker   . Smokeless tobacco: Not on file  . Alcohol Use: No  . Drug Use: Yes  . Sexual Activity: Not on file   Other Topics Concern  . Not on file   Social History Narrative   Married   No caffeine   Managing 7 HH    Pet dog   Regular exercise- yes   BA degree          Past Surgical History  Procedure Laterality Date  . Bunionectomy      bone spur left foot  . Endometrial ablation      Family History  Problem Relation Age of Onset  . Anxiety disorder    . Diabetes Father   . Glaucoma Father   . Myasthenia gravis Mother   . Other Mother     [place]  . Other Brother     devic disease NMO neuromyelitis optica  . Hashimoto's thyroiditis Daughter   . Psoriasis Sister   . Depression Sister   . Arthritis      parents     Allergies  Allergen Reactions  . Compazine Swelling    Throat swelling    Current Outpatient Prescriptions on File Prior to Visit  Medication Sig Dispense Refill  . diazepam (VALIUM) 2 MG tablet       . EQUETRO 200 MG CP12 12 hr capsule Take 200 mg by mouth daily.       [name]  lamoTRIgine (LAMICTAL) 100 MG tablet Take 200 mg by mouth daily.      . naltrexone (DEPADE) 50 MG tablet Take 50 mg by mouth daily.       . traZODone (DESYREL) 100 MG tablet Take 100 mg by mouth at bedtime.      [name] zolpidem (AMBIEN) 5 MG tablet Take 5 mg by mouth at bedtime as needed for sleep.       No current facility-administered medications on file prior to visit.    EXAM: BP 110/80  Pulse 80  Temp(Src) 98.7 F (37.1 C) (Oral)  Resp 18  Wt 181 lb (82.101 kg)  SpO2 98%     Objective:   Physical Exam  Nursing note and vitals reviewed. Constitutional: She is oriented to person, place, and time. She appears well-developed and well-nourished. No distress.  HENT:  Head: Normocephalic and atraumatic.  Right Ear: External ear normal.  Left Ear: External ear normal.  Nose:  Nose normal.  Mouth/Throat: No oropharyngeal exudate.  Oropharynx is slightly erythematous, no exudate. Bilateral TMs normal. Bilateral frontal sinuses non-TTP. Bilateral frontal sinuses mildly TTP.   Eyes: Conjunctivae and EOM are normal. Pupils are equal, round, and reactive to light.  Neck: Normal range of motion. Neck supple.  Cardiovascular: Normal rate, regular rhythm and intact distal pulses.   Pulmonary/Chest: Effort normal and breath sounds normal. No stridor. No respiratory distress. She has no wheezes. She has no rales. She exhibits no tenderness.  Musculoskeletal: Normal range of motion. She exhibits no edema and no tenderness.  Gait normal  Lymphadenopathy:    She has no cervical adenopathy.  Neurological: She is alert and oriented to person, place, and time.  Sensation strength and reflexes grossly intact  Skin: Skin is warm and dry. She is not diaphoretic.  There is a small 1 cm diameter superficial hematoma over the left knee anterior tibial region. This appears to be resolved well. There is no surrounding erythema, swelling, TTP, warmth, fluctuance.  Psychiatric: She has a normal mood and affect. Her behavior is normal. Judgment and thought content normal.     Lab Results  Component Value Date   WBC 3.3* 12/15/2013   HGB 13.0 12/15/2013   HCT 38.6 12/15/2013   PLT 167.0 12/15/2013   GLUCOSE 86 12/15/2013   CHOL 250* 12/15/2013   TRIG 56.0 12/15/2013   HDL 151.80 Verified by manual dilution. 12/15/2013   LDLDIRECT 51.0 09/16/2012   LDLCALC 87 12/15/2013   ALT 17 12/15/2013   AST 20 12/15/2013   NA 134* 12/15/2013   K 3.9 12/15/2013   CL 101 12/15/2013   CREATININE 0.7 12/15/2013   BUN 13 12/15/2013   CO2 27 12/15/2013   TSH 0.29* 12/15/2013        Assessment & Plan:  Alveda was seen today for sinusitis.  Diagnoses and associated orders for this visit:  Acute maxillary sinusitis, recurrence not specified Comments: less than 10 days. Symptomatic treatment OTC mucinex, nasal steroid and  irrigation, antihistamine, rest and fluid hydration. backup rx for doxycycline. - doxycycline (VIBRA-TABS) 100 MG tablet; Take 1 tablet (100 mg total) by mouth 2 (two) times daily.  Hematoma Comments: of knee. uncomplicated. resolving. Continue to use heat. watchful waiting.    Return precautions provided, and patient handout on sinusitis, and hematoma.  Plan to follow up as needed, or for worsening or persistent symptoms despite treatment.  Patient Instructions  Continue to apply heat over the hematoma on your knee. This may take several weeks to  fully resolve.  Plain Over the Counter Mucinex (NOT Mucinex D) for thick secretions  Force NON dairy fluids, drinking plenty of water is best.    Over the Counter Flonase OR Nasacort AQ 1 spray in each nostril twice a day as needed. Use the "crossover" technique into opposite nostril spraying toward opposite ear @ 45 degree angle, not straight up into nostril.   Plain Over the Counter Allegra (NOT D )  160 daily , OR Loratidine 10 mg , OR Zyrtec 10 mg @ bedtime  as needed for itchy eyes & sneezing.  Saline Irrigation and Saline Sprays can also help reduce symptoms.  If your symptoms are worsening through Thursday, you can fill the prescription for doxycycline. Doxycycline twice daily for 10 days. Take with food to prevent nausea.  If emergency symptoms discussed during visit developed, seek medical attention immediately.  Followup as needed, or for worsening or persistent symptoms despite treatment.

## 2014-06-12 ENCOUNTER — Encounter: Payer: Self-pay | Admitting: Physician Assistant

## 2014-06-21 ENCOUNTER — Ambulatory Visit (INDEPENDENT_AMBULATORY_CARE_PROVIDER_SITE_OTHER): Payer: BC Managed Care – PPO | Admitting: Family Medicine

## 2014-06-21 DIAGNOSIS — Z23 Encounter for immunization: Secondary | ICD-10-CM

## 2014-07-08 ENCOUNTER — Encounter: Payer: Self-pay | Admitting: Family Medicine

## 2014-07-08 ENCOUNTER — Ambulatory Visit (INDEPENDENT_AMBULATORY_CARE_PROVIDER_SITE_OTHER): Payer: BC Managed Care – PPO | Admitting: Family Medicine

## 2014-07-08 VITALS — BP 140/96 | Temp 98.4°F | Wt 189.0 lb

## 2014-07-08 DIAGNOSIS — M75101 Unspecified rotator cuff tear or rupture of right shoulder, not specified as traumatic: Secondary | ICD-10-CM | POA: Insufficient documentation

## 2014-07-08 NOTE — Progress Notes (Signed)
Tawana ScaleZach Margart Zemanek D.O. [facility] [address] [place], [place] [address] Phone: [phone] Subjective:     CC: Right shoulder pain  BJY:NWGNFAOZHYHPI:Subjective [name] is a 48 y.o. female coming in with complaint of right shoulder pain. Patient states that this pain has been going on and off for quite some time. Patient started having worsening pain over the course last 48 hours. Patient describes it as a dull aching pain that is worse with range of motion. Patient states that the severity of 7 out of 10. States there is some mild radiation going down the arm. Denies any weakness. States that it is very difficult to get comfortable at night. Patient denies any true injury. Patient does do boxing and did do a lateral repetitive range of motion exercises and lifting while she was cooking thinks giving dinner. Patient does have a past history significant for psoriatic arthritis.     Past medical history, social, surgical and family history all reviewed in electronic medical record.   Review of Systems: No headache, visual changes, nausea, vomiting, diarrhea, constipation, dizziness, abdominal pain, skin rash, fevers, chills, night sweats, weight loss, swollen lymph nodes, body aches, joint swelling, muscle aches, chest pain, shortness of breath, mood changes.   Objective Blood pressure 140/96, temperature 98.4 F (36.9 C), temperature source Oral, weight 189 lb (85.73 kg).  General: No apparent distress alert and oriented x3 mood and affect normal, dressed appropriately.  HEENT: Pupils equal, extraocular movements intact  Respiratory: Patient's speak in full sentences and does not appear short of breath  Cardiovascular: No lower extremity edema, non tender, no erythema  Skin: Warm dry intact with no signs of infection or rash on extremities or on axial skeleton.  Abdomen: Soft nontender  Neuro: Cranial nerves II through XII are intact, neurovascularly intact in all extremities with 2+  DTRs and 2+ pulses.  Lymph: No lymphadenopathy of posterior or anterior cervical chain or axillae bilaterally.  Gait normal with good balance and coordination.  MSK:  Non tender with full range of motion and good stability and symmetric strength and tone of  elbows, wrist, hip, knee and ankles bilaterally.  Shoulder: Right Inspection reveals no abnormalities, atrophy or asymmetry. Palpation is normal with no tenderness over AC joint or bicipital groove. ROM is full in all planes passively. Rotator cuff strength normal throughout. signs of impingement with positive Neer and Hawkin's tests, but negative empty can sign. Speeds and Yergason's tests normal. No labral pathology noted with negative Obrien's, negative clunk and good stability. Normal scapular function observed. No painful arc and no drop arm sign. No apprehension sign  MSK US performed of: Right This study was ordered, performed, and interpreted by Terrilee FilesZach Natasha Burda D.O.  Shoulder:   Supraspinatus:  Appears normal on long and transverse views, Bursal bulge seen with shoulder abduction on impingement view. Infraspinatus:  Appears normal on long and transverse views. Significant increase in Doppler flow Subscapularis:  Appears normal on long and transverse views. Positive bursa Teres Minor:  Appears normal on long and transverse views. AC joint:  Capsule undistended, no geyser sign. Glenohumeral Joint:  Appears normal without effusion. Glenoid Labrum:  Intact without visualized tears. Biceps Tendon:  Appears normal on long and transverse views, no fraying of tendon, tendon located in intertubercular groove, no subluxation with shoulder internal or external rotation.  Impression: Subacromial bursitis  Procedure: Real-time Ultrasound Guided Injection of right glenohumeral joint Device: GE Logiq E  Ultrasound guided injection is preferred based studies that  show increased duration, increased effect, greater accuracy, decreased  procedural pain, increased response rate with ultrasound guided versus blind injection.  Verbal informed consent obtained.  Time-out conducted.  Noted no overlying erythema, induration, or other signs of local infection.  Skin prepped in a sterile fashion.  Local anesthesia: Topical Ethyl chloride.  With sterile technique and under real time ultrasound guidance:  Joint visualized.  23g 1  inch needle inserted posterior approach. Pictures taken for needle placement. Patient did have injection of 2 cc of 1% lidocaine, 2 cc of 0.5% Marcaine, and 1.0 cc of Kenalog 40 mg/dL. Completed without difficulty  Pain immediately resolved suggesting accurate placement of the medication.  Advised to call if fevers/chills, erythema, induration, drainage, or persistent bleeding.  Images permanently stored and available for review in the ultrasound unit.  Impression: Technically successful ultrasound guided injection.     Impression and Recommendations:     This case required medical decision making of moderate complexity.

## 2014-07-08 NOTE — Patient Instructions (Signed)
Good to see you.  Ice 20 minutes 2 times daily. Usually after activity and before bed. Exercises 3 times a week.  Pennsaid topically 2 times Watch sleeping position.  Try duexis 3 times daily for 3 days.  See me again in 2-3 weeks.

## 2014-07-08 NOTE — Assessment & Plan Note (Signed)
Patient was given an injection today. We discussed icing protocol and patient was given a trial of oral as well as topical anti-inflammatories. We discussed icing protocol and showed patient proper technique home exercises. Patient will try to make these changes and come back and see me again in 2-3 weeks. Continuing to have difficulty and we'll consider therapy and potentially advance imaging.

## 2014-07-08 NOTE — Progress Notes (Signed)
Pre visit review using our clinic review tool, if applicable. No additional management support is needed unless otherwise documented below in the visit note. 

## 2014-07-21 ENCOUNTER — Ambulatory Visit: Payer: BC Managed Care – PPO | Admitting: Family Medicine

## 2014-07-21 ENCOUNTER — Telehealth: Payer: Self-pay | Admitting: *Deleted

## 2014-07-21 NOTE — Telephone Encounter (Signed)
[facility] - Client TELEPHONE ADVICE RECORD [facility] Patient Name: Melissa CoombeMICHELE Pinette Gender: Female DOB: [birth year] Age: [age] Y 1 M 11 D Return Phone Number: [phone] (Primary) Address: [place]/Zip: [place] [address] Client [facility] - Client Client Site [facility] Night Physician Terrilee FilesSmith, Zach Contact Type Call Caller Name Melissa CoombeMichele Stegmann Caller Phone Number [phone] Relationship To Patient Self Is this call to report lab results? No Call Type General Information Initial Comment Caller states she has an appointment at 10:45 today. She has to cancel. Advised I would fax info and advised her to follow up with the office when they open General Information Type Appointment Nurse Assessment Guidelines Guideline Title Affirmed Question Affirmed Notes Nurse Date/Time (Eastern Time) Disp. Time Lamount Cohen(Eastern Time) Disposition Final User 07/21/2014 7:34:34 AM General Information Provided Yes Dorann OuMyers, Janet After Care Instructions Given Call Event Type User Date / Time Description

## 2014-07-26 ENCOUNTER — Other Ambulatory Visit: Payer: Self-pay | Admitting: Obstetrics and Gynecology

## 2014-07-27 LAB — CYTOLOGY - PAP

## 2014-10-11 ENCOUNTER — Encounter: Payer: Self-pay | Admitting: Internal Medicine

## 2014-10-25 ENCOUNTER — Other Ambulatory Visit (INDEPENDENT_AMBULATORY_CARE_PROVIDER_SITE_OTHER): Payer: BLUE CROSS/BLUE SHIELD

## 2014-10-25 DIAGNOSIS — R7989 Other specified abnormal findings of blood chemistry: Secondary | ICD-10-CM

## 2014-10-25 DIAGNOSIS — Z0000 Encounter for general adult medical examination without abnormal findings: Secondary | ICD-10-CM

## 2014-10-25 LAB — CBC WITH DIFFERENTIAL/PLATELET
BASOS ABS: 0 10*3/uL (ref 0.0–0.1)
Basophils Relative: 0.9 % (ref 0.0–3.0)
Eosinophils Absolute: 0.1 10*3/uL (ref 0.0–0.7)
Eosinophils Relative: 2.3 % (ref 0.0–5.0)
HCT: 36.4 % (ref 36.0–46.0)
Hemoglobin: 12.7 g/dL (ref 12.0–15.0)
LYMPHS ABS: 1 10*3/uL (ref 0.7–4.0)
Lymphocytes Relative: 27.7 % (ref 12.0–46.0)
MCHC: 34.9 g/dL (ref 30.0–36.0)
MCV: 91.4 fl (ref 78.0–100.0)
Monocytes Absolute: 0.4 10*3/uL (ref 0.1–1.0)
Monocytes Relative: 10.4 % (ref 3.0–12.0)
Neutro Abs: 2.1 10*3/uL (ref 1.4–7.7)
Neutrophils Relative %: 58.7 % (ref 43.0–77.0)
PLATELETS: 216 10*3/uL (ref 150.0–400.0)
RBC: 3.98 Mil/uL (ref 3.87–5.11)
RDW: 13 % (ref 11.5–15.5)
WBC: 3.7 10*3/uL — ABNORMAL LOW (ref 4.0–10.5)

## 2014-10-25 LAB — T3, FREE: T3, Free: 3.6 pg/mL (ref 2.3–4.2)

## 2014-10-25 LAB — HEPATIC FUNCTION PANEL
ALBUMIN: 4.7 g/dL (ref 3.5–5.2)
ALT: 30 U/L (ref 0–35)
AST: 28 U/L (ref 0–37)
Alkaline Phosphatase: 78 U/L (ref 39–117)
Bilirubin, Direct: 0.1 mg/dL (ref 0.0–0.3)
Total Bilirubin: 0.5 mg/dL (ref 0.2–1.2)
Total Protein: 7 g/dL (ref 6.0–8.3)

## 2014-10-25 LAB — BASIC METABOLIC PANEL
BUN: 10 mg/dL (ref 6–23)
CALCIUM: 9.4 mg/dL (ref 8.4–10.5)
CO2: 27 meq/L (ref 19–32)
CREATININE: 0.74 mg/dL (ref 0.40–1.20)
Chloride: 91 mEq/L — ABNORMAL LOW (ref 96–112)
GFR: 88.16 mL/min (ref >60.00)
GLUCOSE: 89 mg/dL (ref 70–99)
Potassium: 4.4 mEq/L (ref 3.5–5.1)
Sodium: 127 mEq/L — ABNORMAL LOW (ref 135–145)

## 2014-10-25 LAB — LIPID PANEL
Cholesterol: 263 mg/dL — ABNORMAL HIGH (ref 0–200)
HDL: 159.6 mg/dL (ref >39.00)
LDL Cholesterol: 93 mg/dL (ref 0–99)
NONHDL: 103.4
Total CHOL/HDL Ratio: 2
Triglycerides: 51 mg/dL (ref 0.0–149.0)
VLDL: 10.2 mg/dL (ref 0.0–40.0)

## 2014-10-25 LAB — T4, FREE: FREE T4: 0.93 ng/dL (ref 0.60–1.60)

## 2014-10-25 LAB — TSH: TSH: 0.94 u[IU]/mL (ref 0.35–4.50)

## 2014-11-01 ENCOUNTER — Ambulatory Visit (INDEPENDENT_AMBULATORY_CARE_PROVIDER_SITE_OTHER): Payer: BLUE CROSS/BLUE SHIELD | Admitting: Internal Medicine

## 2014-11-01 ENCOUNTER — Encounter: Payer: Self-pay | Admitting: Internal Medicine

## 2014-11-01 VITALS — BP 158/90 | Temp 98.4°F | Ht 67.0 in | Wt 177.0 lb

## 2014-11-01 DIAGNOSIS — Z23 Encounter for immunization: Secondary | ICD-10-CM | POA: Diagnosis not present

## 2014-11-01 DIAGNOSIS — Z0000 Encounter for general adult medical examination without abnormal findings: Secondary | ICD-10-CM | POA: Diagnosis not present

## 2014-11-01 DIAGNOSIS — IMO0001 Reserved for inherently not codable concepts without codable children: Secondary | ICD-10-CM

## 2014-11-01 DIAGNOSIS — G479 Sleep disorder, unspecified: Secondary | ICD-10-CM

## 2014-11-01 DIAGNOSIS — E871 Hypo-osmolality and hyponatremia: Secondary | ICD-10-CM | POA: Diagnosis not present

## 2014-11-01 DIAGNOSIS — R030 Elevated blood-pressure reading, without diagnosis of hypertension: Secondary | ICD-10-CM

## 2014-11-01 NOTE — Patient Instructions (Signed)
Consider   Sleep evaluation specialist .  Ask psych also. Can do referral.   Take blood pressure readings twice a day for 10- 14  days and then periodically .To ensure below 140/90   Send in readings .  If ok then yearly visit with labs       Why follow it? Research shows. . Those who follow the Mediterranean diet have a reduced risk of heart disease  . The diet is associated with a reduced incidence of Parkinson's and Alzheimer's diseases . People following the diet may have longer life expectancies and lower rates of chronic diseases  . The Dietary Guidelines for Americans recommends the Mediterranean diet as an eating plan to promote health and prevent disease  What Is the Mediterranean Diet?  . Healthy eating plan based on typical foods and recipes of Mediterranean-style cooking . The diet is primarily a plant based diet; these foods should make up a majority of meals   Starches - Plant based foods should make up a majority of meals - They are an important sources of vitamins, minerals, energy, antioxidants, and fiber - Choose whole grains, foods high in fiber and minimally processed items  - Typical grain sources include wheat, oats, barley, corn, brown rice, bulgar, farro, millet, polenta, couscous  - Various types of beans include chickpeas, lentils, fava beans, black beans, white beans   Fruits  Veggies - Large quantities of antioxidant rich fruits & veggies; 6 or more servings  - Vegetables can be eaten raw or lightly drizzled with oil and cooked  - Vegetables common to the traditional Mediterranean Diet include: artichokes, arugula, beets, broccoli, brussel sprouts, cabbage, carrots, celery, collard greens, cucumbers, eggplant, kale, leeks, lemons, lettuce, mushrooms, okra, onions, peas, peppers, potatoes, pumpkin, radishes, rutabaga, shallots, spinach, sweet potatoes, turnips, zucchini - Fruits common to the Mediterranean Diet include: apples, apricots, avocados, cherries,  clementines, dates, figs, grapefruits, grapes, melons, nectarines, oranges, peaches, pears, pomegranates, strawberries, tangerines  Fats - Replace butter and margarine with healthy oils, such as olive oil, canola oil, and tahini  - Limit nuts to no more than a handful a day  - Nuts include walnuts, almonds, pecans, pistachios, pine nuts  - Limit or avoid candied, honey roasted or heavily salted nuts - Olives are central to the [employer] - can be eaten whole or used in a variety of dishes   Meats Protein - Limiting red meat: no more than a few times a month - When eating red meat: choose lean cuts and keep the portion to the size of deck of cards - Eggs: approx. 0 to 4 times a week  - Fish and lean poultry: at least 2 a week  - Healthy protein sources include, chicken, [place], lean beef, lamb - Increase intake of seafood such as tuna, salmon, trout, mackerel, shrimp, scallops - Avoid or limit high fat processed meats such as sausage and bacon  Dairy - Include moderate amounts of low fat dairy products  - Focus on healthy dairy such as fat free yogurt, skim milk, low or reduced fat cheese - Limit dairy products higher in fat such as whole or 2% milk, cheese, ice cream  Alcohol - Moderate amounts of red wine is ok  - No more than 5 oz daily for women (all ages) and men older than age 17  - No more than 10 oz of wine daily for men younger than 97  Other - Limit sweets and other desserts  - Use herbs and  spices instead of salt to flavor foods  - Herbs and spices common to the traditional Mediterranean Diet include: basil, bay leaves, chives, cloves, cumin, fennel, garlic, lavender, marjoram, mint, oregano, parsley, pepper, rosemary, sage, savory, sumac, tarragon, thyme   It's not just a diet, it's a lifestyle:  . The Mediterranean diet includes lifestyle factors typical of those in the region  . Foods, drinks and meals are best eaten with others and savored . Daily physical activity is  important for overall good health . This could be strenuous exercise like running and aerobics . This could also be more leisurely activities such as walking, housework, yard-work, or taking the stairs . Moderation is the key; a balanced and healthy diet accommodates most foods and drinks . Consider portion sizes and frequency of consumption of certain foods   Meal Ideas & Options:  . Breakfast:  o Whole wheat toast or whole wheat English muffins with peanut butter & hard boiled egg o Steel cut oats topped with apples & cinnamon and skim milk  o Fresh fruit: banana, strawberries, melon, berries, peaches  o Smoothies: strawberries, bananas, greek yogurt, peanut butter o Low fat greek yogurt with blueberries and granola  o Egg white omelet with spinach and mushrooms o Breakfast couscous: whole wheat couscous, apricots, skim milk, cranberries  . Sandwiches:  o Hummus and grilled vegetables (peppers, zucchini, squash) on whole wheat bread   o Grilled chicken on whole wheat pita with lettuce, tomatoes, cucumbers or tzatziki  o Tuna salad on whole wheat bread: tuna salad made with greek yogurt, olives, red peppers, capers, green onions o Garlic rosemary lamb pita: lamb sauted with garlic, rosemary, salt & pepper; add lettuce, cucumber, greek yogurt to pita - flavor with lemon juice and black pepper  . Seafood:  o Mediterranean grilled salmon, seasoned with garlic, basil, parsley, lemon juice and black pepper o Shrimp, lemon, and spinach whole-grain pasta salad made with low fat greek yogurt  o Seared scallops with lemon orzo  o Seared tuna steaks seasoned salt, pepper, coriander topped with tomato mixture of olives, tomatoes, olive oil, minced garlic, parsley, green onions and cappers  . Meats:  o Herbed greek chicken salad with kalamata olives, cucumber, feta  o Red bell peppers stuffed with spinach, bulgur, lean ground beef (or lentils) & topped with feta   o Kebabs: skewers of chicken,  tomatoes, onions, zucchini, squash  o [place] burgers: made with red onions, mint, dill, lemon juice, feta cheese topped with roasted red peppers . Vegetarian o Cucumber salad: cucumbers, artichoke hearts, celery, red onion, feta cheese, tossed in olive oil & lemon juice  o Hummus and whole grain pita points with a greek salad (lettuce, tomato, feta, olives, cucumbers, red onion) o Lentil soup with celery, carrots made with vegetable broth, garlic, salt and pepper  o Tabouli salad: parsley, bulgur, mint, scallions, cucumbers, tomato, radishes, lemon juice, olive oil, salt and pepper.        DASH Eating Plan DASH stands for "Dietary Approaches to Stop Hypertension." The DASH eating plan is a healthy eating plan that has been shown to reduce high blood pressure (hypertension). Additional health benefits may include reducing the risk of type 2 diabetes mellitus, heart disease, and stroke. The DASH eating plan may also help with weight loss. WHAT DO I NEED TO KNOW ABOUT THE DASH EATING PLAN? For the DASH eating plan, you will follow these general guidelines:  Choose foods with a percent daily value for sodium of less than  5% (as listed on the food label).  Use salt-free seasonings or herbs instead of table salt or sea salt.  Check with your health care provider or pharmacist before using salt substitutes.  Eat lower-sodium products, often labeled as "lower sodium" or "no salt added."  Eat fresh foods.  Eat more vegetables, fruits, and low-fat dairy products.  Choose whole grains. Look for the word "whole" as the first word in the ingredient list.  Choose fish and skinless chicken or [place] more often than red meat. Limit fish, poultry, and meat to 6 oz (170 g) each day.  Limit sweets, desserts, sugars, and sugary drinks.  Choose heart-healthy fats.  Limit cheese to 1 oz (28 g) per day.  Eat more home-cooked food and less restaurant, buffet, and fast food.  Limit fried foods.  Cook  foods using methods other than frying.  Limit canned vegetables. If you do use them, rinse them well to decrease the sodium.  When eating at a restaurant, ask that your food be prepared with less salt, or no salt if possible. WHAT FOODS CAN I EAT? Seek help from a dietitian for individual calorie needs. Grains Whole grain or whole wheat bread. Brown rice. Whole grain or whole wheat pasta. Quinoa, bulgur, and whole grain cereals. Low-sodium cereals. Corn or whole wheat flour tortillas. Whole grain cornbread. Whole grain crackers. Low-sodium crackers. Vegetables Fresh or frozen vegetables (raw, steamed, roasted, or grilled). Low-sodium or reduced-sodium tomato and vegetable juices. Low-sodium or reduced-sodium tomato sauce and paste. Low-sodium or reduced-sodium canned vegetables.  Fruits All fresh, canned (in natural juice), or frozen fruits. Meat and Other Protein Products Ground beef (85% or leaner), grass-fed beef, or beef trimmed of fat. Skinless chicken or [place]. Ground chicken or [place]. Pork trimmed of fat. All fish and seafood. Eggs. Dried beans, peas, or lentils. Unsalted nuts and seeds. Unsalted canned beans. Dairy Low-fat dairy products, such as skim or 1% milk, 2% or reduced-fat cheeses, low-fat ricotta or cottage cheese, or plain low-fat yogurt. Low-sodium or reduced-sodium cheeses. Fats and Oils Tub margarines without trans fats. Light or reduced-fat mayonnaise and salad dressings (reduced sodium). Avocado. Safflower, olive, or canola oils. Natural peanut or almond butter. Other Unsalted popcorn and pretzels. The items listed above may not be a complete list of recommended foods or beverages. Contact your dietitian for more options. WHAT FOODS ARE NOT RECOMMENDED? Grains White bread. White pasta. White rice. Refined cornbread. Bagels and croissants. Crackers that contain trans fat. Vegetables Creamed or fried vegetables. Vegetables in a cheese sauce. Regular canned vegetables.  Regular canned tomato sauce and paste. Regular tomato and vegetable juices. Fruits Dried fruits. Canned fruit in light or heavy syrup. Fruit juice. Meat and Other Protein Products Fatty cuts of meat. Ribs, chicken wings, bacon, sausage, bologna, salami, chitterlings, fatback, hot dogs, bratwurst, and packaged luncheon meats. Salted nuts and seeds. Canned beans with salt. Dairy Whole or 2% milk, cream, half-and-half, and cream cheese. Whole-fat or sweetened yogurt. Full-fat cheeses or blue cheese. Nondairy creamers and whipped toppings. Processed cheese, cheese spreads, or cheese curds. Condiments Onion and garlic salt, seasoned salt, table salt, and sea salt. Canned and packaged gravies. Worcestershire sauce. Tartar sauce. Barbecue sauce. Teriyaki sauce. Soy sauce, including reduced sodium. Steak sauce. Fish sauce. Oyster sauce. Cocktail sauce. Horseradish. Ketchup and mustard. Meat flavorings and tenderizers. Bouillon cubes. Hot sauce. Tabasco sauce. Marinades. Taco seasonings. Relishes. Fats and Oils Butter, stick margarine, lard, shortening, ghee, and bacon fat. Coconut, palm kernel, or palm oils. Regular salad  dressings. Other Pickles and olives. Salted popcorn and pretzels. The items listed above may not be a complete list of foods and beverages to avoid. Contact your dietitian for more information. WHERE CAN I FIND MORE INFORMATION? National Heart, Lung, and Blood Institute: [employer] Document Released: 07/17/2011 Document Revised: 12/12/2013 Document Reviewed: 06/01/2013 [facility] Patient Information 2015 [place], [place]. This information is not intended to replace advice given to you by your health care provider. Make sure you discuss any questions you have with your health care provider.

## 2014-11-01 NOTE — Progress Notes (Signed)
Pre visit review using our clinic review tool, if applicable. No additional management support is needed unless otherwise documented below in the visit note.  Chief Complaint  Patient presents with  . Annual Exam    HPI: [name]  49 y.o. comes in today for Preventive Health Care visit  Needs form signed  Father  Died past year .   Had dm and neuropathy .  Doing ok  Under psych care doing ok may be seasonal anhedonia . Light therapy discussed may have rls  . fam hx of osa  Son and father .  Has had poor sleep every since she can remember . awakening had jerky legs during colonoscopy.  Tried diet med no sleeping .  To help weigh loss not taking   Health Maintenance  Topic Date Due  . HIV Screening  10/10/2015 (Originally 06/11/1981)  . INFLUENZA VACCINE  03/12/2015  . MAMMOGRAM  07/26/2016  . PAP SMEAR  07/26/2017  . TETANUS/TDAP  09/08/2021  . COLONOSCOPY  09/27/2024   Health Maintenance Review LIFESTYLE:  Exercise:   Spin  Episodic .  Tobacco/ETS: noAlcohol:   Wine ocass Sugar beverages: Sleep: 6-7 with med  Interrupted .  Drug use: no Colonoscopy:  Dr Loreta Avemann PAP: dec  MAMMO: dec    ROS:  GEN/ HEENT: No fever, significant weight changes sweats headaches vision problems hearing changes, CV/ PULM; No chest pain shortness of breath cough, syncope,edema  change in exercise tolerance. GI /GU: No adominal pain, vomiting, change in bowel habits. No blood in the stool. No significant GU symptoms. SKIN/HEME: ,no acute skin rashes suspicious lesions or bleeding. No lymphadenopathy, nodules, masses.  NEURO/ PSYCH:  No neurologic signs such as weakness numbness.  Except left foot  Middle after foot surgery No  anxiety. IMM/ Allergy: No unusual infections.  Allergy .   REST of 12 system review negative except as per HPI   Past Medical History  Diagnosis Date  . ADD (attention deficit disorder)     poss  . Depression   . Psoriasis   . Psoriatic arthritis  10/2012    Past Surgical History  Procedure Laterality Date  . Bunionectomy      bone spur left foot  . Endometrial ablation      Family History  Problem Relation Age of Onset  . Anxiety disorder    . Diabetes Father   . Glaucoma Father   . Myasthenia gravis Mother   . Other Mother     PA  . Other Brother     devic disease NMO neuromyelitis optica  . Hashimoto's thyroiditis Daughter   . Psoriasis Sister   . Depression Sister   . Arthritis      parents     History   Social History  . Marital Status: Married    Spouse Name: N/A  . Number of Children: N/A  . Years of Education: N/A   Social History Main Topics  . Smoking status: Never Smoker   . Smokeless tobacco: Not on file  . Alcohol Use: No  . Drug Use: Yes  . Sexual Activity: Not on file   Other Topics Concern  . None   Social History Narrative    Married   No caffeine   Managing HH   Of 4    Pet dog   Regular exercise- yes  Not much in winter    [facility] [employer] [facility]          Outpatient Encounter Prescriptions  as of 11/01/2014  Medication Sig  . EQUETRO 200 MG CP12 12 hr capsule Take 200 mg by mouth daily.   [name] lamoTRIgine (LAMICTAL) 100 MG tablet Take 200 mg by mouth daily.  [name] LINZESS 290 MCG CAPS capsule Take 1 capsule by mouth daily.  . naltrexone (DEPADE) 50 MG tablet Take 50 mg by mouth daily.   . traZODone (DESYREL) 100 MG tablet Take 100 mg by mouth at bedtime.  [name] zolpidem (AMBIEN) 5 MG tablet Take 5 mg by mouth at bedtime as needed for sleep.  . [DISCONTINUED] diazepam (VALIUM) 2 MG tablet     EXAM:  BP 158/90 mmHg  Temp(Src) 98.4 F (36.9 C) (Oral)  Ht 5\' 7"  (1.702 m)  Wt 177 lb (80.287 kg)  BMI 27.72 kg/m2  Body mass index is 27.72 kg/(m^2).  Physical Exam: Vital signs reviewed ZOX:WRUE is a well-developed well-nourished alert cooperative    who appearsr stated age in no acute distress.  HEENT: normocephalic atraumatic , Eyes: PERRL EOM's full, conjunctiva clear, Nares: paten,t no  deformity discharge or tenderness., Ears: no deformity EAC's clear TMs with normal landmarks. Mouth: clear OP, no lesions, edema.  Moist mucous membranes. Dentition in adequate repair. NECK: supple without masses, thyromegaly or bruits. CHEST/PULM:  Clear to auscultation and percussion breath sounds equal no wheeze , rales or rhonchi. No chest wall deformities or tenderness. CV: PMI is nondisplaced, S1 S2 no gallops, murmurs, rubs. Peripheral pulses are full without delay.No JVD . Breast: normal by inspection . No dimpling, discharge, masses, tenderness or discharge .  ABDOMEN: Bowel sounds normal nontender  No guard or rebound, no hepato splenomegal no CVA tenderness.  No hernia. Extremtities:  No clubbing cyanosis or edema, no acute joint swelling or redness no focal atrophy NEURO:  Oriented x3, cranial nerves 3-12 appear to be intact, no obvious focal weakness,gait within normal limits no abnormal reflexes or asymmetrical SKIN: No acute rashes normal turgor, color, no bruising or petechiae. Few plaques on elbows . PSYCH: Oriented, good eye contact, no obvious depression anxiety, cognition and judgment appear normal.some LN: no cervical axillary inguinal adenopathy  Lab Results  Component Value Date   WBC 3.7* 10/25/2014   HGB 12.7 10/25/2014   HCT 36.4 10/25/2014   PLT 216.0 10/25/2014   GLUCOSE 89 10/25/2014   CHOL 263* 10/25/2014   TRIG 51.0 10/25/2014   HDL 159.60 10/25/2014   LDLDIRECT 51.0 09/16/2012   LDLCALC 93 10/25/2014   ALT 30 10/25/2014   AST 28 10/25/2014   NA 127* 10/25/2014   K 4.4 10/25/2014   CL 91* 10/25/2014   CREATININE 0.74 10/25/2014   BUN 10 10/25/2014   CO2 27 10/25/2014   TSH 0.94 10/25/2014    ASSESSMENT AND PLAN:  Discussed the following assessment and plan:  Visit for preventive health examination - counseled .  Need for hepatitis A and B vaccination - Plan: Hepatitis A hepatitis B combined vaccine IM  Elevated BP reading x 1 - prob from ov nl  at dr Loreta Ave and other   proividers check at home   Sleep disturbance - poss rls other sleep issue not psch related  do referral  - Plan: Ambulatory referral to Neurology  Hyponatremia - no sx possfrom meds have psych fu with this elevated BP reading may be from visit  Suspect white coat today has beennl and Nl at dr [facility] office 129/85. Form signed  Disc sleep eval  Will proceed but talk with psych also  [place]  Let [place] know wheat needed or travel med clinic Monitor and send in readings sign up for my chart  Patient Care Team: Madelin Headings, MD as PCP - General Cherly Hensen, MD (Psychiatry) Campbell Stall, MD as Attending Physician (Dermatology) Levi Aland, MD as Attending Physician (Obstetrics and Gynecology) Patient Instructions   Consider   Sleep evaluation specialist .  Ask psych also. Can do referral.   Take blood pressure readings twice a day for 10- 14  days and then periodically .To ensure below 140/90   Send in readings .  If ok then yearly visit with labs       Why follow it? Research shows. . Those who follow the Mediterranean diet have a reduced risk of heart disease  . The diet is associated with a reduced incidence of Parkinson's and Alzheimer's diseases . People following the diet may have longer life expectancies and lower rates of chronic diseases  . The Dietary Guidelines for Americans recommends the Mediterranean diet as an eating plan to promote health and prevent disease  What Is the Mediterranean Diet?  . Healthy eating plan based on typical foods and recipes of Mediterranean-style cooking . The diet is primarily a plant based diet; these foods should make up a majority of meals   Starches - Plant based foods should make up a majority of meals - They are an important sources of vitamins, minerals, energy, antioxidants, and fiber - Choose whole grains, foods high in fiber and minimally processed items  - Typical grain sources include  wheat, oats, barley, corn, brown rice, bulgar, farro, millet, polenta, couscous  - Various types of beans include chickpeas, lentils, fava beans, black beans, white beans   Fruits  Veggies - Large quantities of antioxidant rich fruits & veggies; 6 or more servings  - Vegetables can be eaten raw or lightly drizzled with oil and cooked  - Vegetables common to the traditional Mediterranean Diet include: artichokes, arugula, beets, broccoli, brussel sprouts, cabbage, carrots, celery, collard greens, cucumbers, eggplant, kale, leeks, lemons, lettuce, mushrooms, okra, onions, peas, peppers, potatoes, pumpkin, radishes, rutabaga, shallots, spinach, sweet potatoes, turnips, zucchini - Fruits common to the Mediterranean Diet include: apples, apricots, avocados, cherries, clementines, dates, figs, grapefruits, grapes, melons, nectarines, oranges, peaches, pears, pomegranates, strawberries, tangerines  Fats - Replace butter and margarine with healthy oils, such as olive oil, canola oil, and tahini  - Limit nuts to no more than a handful a day  - Nuts include walnuts, almonds, pecans, pistachios, pine nuts  - Limit or avoid candied, honey roasted or heavily salted nuts - Olives are central to the [employer] - can be eaten whole or used in a variety of dishes   Meats Protein - Limiting red meat: no more than a few times a month - When eating red meat: choose lean cuts and keep the portion to the size of deck of cards - Eggs: approx. 0 to 4 times a week  - Fish and lean poultry: at least 2 a week  - Healthy protein sources include, chicken, [place], lean beef, lamb - Increase intake of seafood such as tuna, salmon, trout, mackerel, shrimp, scallops - Avoid or limit high fat processed meats such as sausage and bacon  Dairy - Include moderate amounts of low fat dairy products  - Focus on healthy dairy such as fat free yogurt, skim milk, low or reduced fat cheese - Limit dairy products higher in fat such  as whole or 2%  milk, cheese, ice cream  Alcohol - Moderate amounts of red wine is ok  - No more than 5 oz daily for women (all ages) and men older than age 58  - No more than 10 oz of wine daily for men younger than 7  Other - Limit sweets and other desserts  - Use herbs and spices instead of salt to flavor foods  - Herbs and spices common to the traditional Mediterranean Diet include: basil, bay leaves, chives, cloves, cumin, fennel, garlic, lavender, marjoram, mint, oregano, parsley, pepper, rosemary, sage, savory, sumac, tarragon, thyme   It's not just a diet, it's a lifestyle:  . The Mediterranean diet includes lifestyle factors typical of those in the region  . Foods, drinks and meals are best eaten with others and savored . Daily physical activity is important for overall good health . This could be strenuous exercise like running and aerobics . This could also be more leisurely activities such as walking, housework, yard-work, or taking the stairs . Moderation is the key; a balanced and healthy diet accommodates most foods and drinks . Consider portion sizes and frequency of consumption of certain foods   Meal Ideas & Options:  . Breakfast:  o Whole wheat toast or whole wheat English muffins with peanut butter & hard boiled egg o Steel cut oats topped with apples & cinnamon and skim milk  o Fresh fruit: banana, strawberries, melon, berries, peaches  o Smoothies: strawberries, bananas, greek yogurt, peanut butter o Low fat greek yogurt with blueberries and granola  o Egg white omelet with spinach and mushrooms o Breakfast couscous: whole wheat couscous, apricots, skim milk, cranberries  . Sandwiches:  o Hummus and grilled vegetables (peppers, zucchini, squash) on whole wheat bread   o Grilled chicken on whole wheat pita with lettuce, tomatoes, cucumbers or tzatziki  o Tuna salad on whole wheat bread: tuna salad made with greek yogurt, olives, red peppers, capers, green  onions o Garlic rosemary lamb pita: lamb sauted with garlic, rosemary, salt & pepper; add lettuce, cucumber, greek yogurt to pita - flavor with lemon juice and black pepper  . Seafood:  o Mediterranean grilled salmon, seasoned with garlic, basil, parsley, lemon juice and black pepper o Shrimp, lemon, and spinach whole-grain pasta salad made with low fat greek yogurt  o Seared scallops with lemon orzo  o Seared tuna steaks seasoned salt, pepper, coriander topped with tomato mixture of olives, tomatoes, olive oil, minced garlic, parsley, green onions and cappers  . Meats:  o Herbed greek chicken salad with kalamata olives, cucumber, feta  o Red bell peppers stuffed with spinach, bulgur, lean ground beef (or lentils) & topped with feta   o Kebabs: skewers of chicken, tomatoes, onions, zucchini, squash  o [place] burgers: made with red onions, mint, dill, lemon juice, feta cheese topped with roasted red peppers . Vegetarian o Cucumber salad: cucumbers, artichoke hearts, celery, red onion, feta cheese, tossed in olive oil & lemon juice  o Hummus and whole grain pita points with a greek salad (lettuce, tomato, feta, olives, cucumbers, red onion) o Lentil soup with celery, carrots made with vegetable broth, garlic, salt and pepper  o Tabouli salad: parsley, bulgur, mint, scallions, cucumbers, tomato, radishes, lemon juice, olive oil, salt and pepper.        DASH Eating Plan DASH stands for "Dietary Approaches to Stop Hypertension." The DASH eating plan is a healthy eating plan that has been shown to reduce high blood pressure (hypertension). Additional health benefits  may include reducing the risk of type 2 diabetes mellitus, heart disease, and stroke. The DASH eating plan may also help with weight loss. WHAT DO I NEED TO KNOW ABOUT THE DASH EATING PLAN? For the DASH eating plan, you will follow these general guidelines:  Choose foods with a percent daily value for sodium of less than 5% (as listed  on the food label).  Use salt-free seasonings or herbs instead of table salt or sea salt.  Check with your health care provider or pharmacist before using salt substitutes.  Eat lower-sodium products, often labeled as "lower sodium" or "no salt added."  Eat fresh foods.  Eat more vegetables, fruits, and low-fat dairy products.  Choose whole grains. Look for the word "whole" as the first word in the ingredient list.  Choose fish and skinless chicken or [place] more often than red meat. Limit fish, poultry, and meat to 6 oz (170 g) each day.  Limit sweets, desserts, sugars, and sugary drinks.  Choose heart-healthy fats.  Limit cheese to 1 oz (28 g) per day.  Eat more home-cooked food and less restaurant, buffet, and fast food.  Limit fried foods.  Cook foods using methods other than frying.  Limit canned vegetables. If you do use them, rinse them well to decrease the sodium.  When eating at a restaurant, ask that your food be prepared with less salt, or no salt if possible. WHAT FOODS CAN I EAT? Seek help from a dietitian for individual calorie needs. Grains Whole grain or whole wheat bread. Brown rice. Whole grain or whole wheat pasta. Quinoa, bulgur, and whole grain cereals. Low-sodium cereals. Corn or whole wheat flour tortillas. Whole grain cornbread. Whole grain crackers. Low-sodium crackers. Vegetables Fresh or frozen vegetables (raw, steamed, roasted, or grilled). Low-sodium or reduced-sodium tomato and vegetable juices. Low-sodium or reduced-sodium tomato sauce and paste. Low-sodium or reduced-sodium canned vegetables.  Fruits All fresh, canned (in natural juice), or frozen fruits. Meat and Other Protein Products Ground beef (85% or leaner), grass-fed beef, or beef trimmed of fat. Skinless chicken or [place]. Ground chicken or [place]. Pork trimmed of fat. All fish and seafood. Eggs. Dried beans, peas, or lentils. Unsalted nuts and seeds. Unsalted canned  beans. Dairy Low-fat dairy products, such as skim or 1% milk, 2% or reduced-fat cheeses, low-fat ricotta or cottage cheese, or plain low-fat yogurt. Low-sodium or reduced-sodium cheeses. Fats and Oils Tub margarines without trans fats. Light or reduced-fat mayonnaise and salad dressings (reduced sodium). Avocado. Safflower, olive, or canola oils. Natural peanut or almond butter. Other Unsalted popcorn and pretzels. The items listed above may not be a complete list of recommended foods or beverages. Contact your dietitian for more options. WHAT FOODS ARE NOT RECOMMENDED? Grains White bread. White pasta. White rice. Refined cornbread. Bagels and croissants. Crackers that contain trans fat. Vegetables Creamed or fried vegetables. Vegetables in a cheese sauce. Regular canned vegetables. Regular canned tomato sauce and paste. Regular tomato and vegetable juices. Fruits Dried fruits. Canned fruit in light or heavy syrup. Fruit juice. Meat and Other Protein Products Fatty cuts of meat. Ribs, chicken wings, bacon, sausage, bologna, salami, chitterlings, fatback, hot dogs, bratwurst, and packaged luncheon meats. Salted nuts and seeds. Canned beans with salt. Dairy Whole or 2% milk, cream, half-and-half, and cream cheese. Whole-fat or sweetened yogurt. Full-fat cheeses or blue cheese. Nondairy creamers and whipped toppings. Processed cheese, cheese spreads, or cheese curds. Condiments Onion and garlic salt, seasoned salt, table salt, and sea salt. Canned and packaged gravies.  Worcestershire sauce. Tartar sauce. Barbecue sauce. Teriyaki sauce. Soy sauce, including reduced sodium. Steak sauce. Fish sauce. Oyster sauce. Cocktail sauce. Horseradish. Ketchup and mustard. Meat flavorings and tenderizers. Bouillon cubes. Hot sauce. Tabasco sauce. Marinades. Taco seasonings. Relishes. Fats and Oils Butter, stick margarine, lard, shortening, ghee, and bacon fat. Coconut, palm kernel, or palm oils. Regular salad  dressings. Other Pickles and olives. Salted popcorn and pretzels. The items listed above may not be a complete list of foods and beverages to avoid. Contact your dietitian for more information. WHERE CAN I FIND MORE INFORMATION? National Heart, Lung, and Blood Institute: [employer] Document Released: 07/17/2011 Document Revised: 12/12/2013 Document Reviewed: 06/01/2013 [facility] Patient Information 2015 [place], [place]. This information is not intended to replace advice given to you by your health care provider. Make sure you discuss any questions you have with your health care provider.         Neta Mends. Panosh M.D.

## 2015-01-04 ENCOUNTER — Ambulatory Visit (INDEPENDENT_AMBULATORY_CARE_PROVIDER_SITE_OTHER): Payer: BLUE CROSS/BLUE SHIELD | Admitting: Family Medicine

## 2015-01-04 DIAGNOSIS — Z23 Encounter for immunization: Secondary | ICD-10-CM

## 2015-02-09 ENCOUNTER — Telehealth: Payer: Self-pay | Admitting: Internal Medicine

## 2015-02-09 MED ORDER — ZOLPIDEM TARTRATE 5 MG PO TABS: 5.0000 mg | ORAL_TABLET | Freq: Every evening | ORAL | Status: AC | PRN

## 2015-02-09 NOTE — Telephone Encounter (Signed)
Pt call to say that she can not reach her doctor that rx her the following med.  [place] .  zolpidem (AMBIEN) 5 MG tablet   Pt is asking if Dr Fabian SharpPanosh will rx the above med

## 2015-02-09 NOTE — Telephone Encounter (Signed)
CMA attempted to call psych office and  Unable to reach any contacts  Ok to rx 15 pills  No refill s in the interim .

## 2015-02-09 NOTE — Telephone Encounter (Signed)
Spoke to the pt.  Updated her medication list.  She is not taking Linzess.  All others are correct.  She is asking for a week supply of medication.  Please advise.  Thanks!

## 2015-02-09 NOTE — Telephone Encounter (Signed)
#  15 Called to the pharmacy and left on machine.  Pt notified.

## 2015-11-15 DIAGNOSIS — Z7689 Persons encountering health services in other specified circumstances: Secondary | ICD-10-CM

## 2015-12-20 DIAGNOSIS — F3181 Bipolar II disorder: Secondary | ICD-10-CM | POA: Diagnosis not present

## 2016-01-08 ENCOUNTER — Encounter: Payer: Self-pay | Admitting: Family Medicine

## 2016-01-08 ENCOUNTER — Other Ambulatory Visit: Payer: Self-pay

## 2016-01-08 ENCOUNTER — Ambulatory Visit (INDEPENDENT_AMBULATORY_CARE_PROVIDER_SITE_OTHER): Payer: BLUE CROSS/BLUE SHIELD | Admitting: Family Medicine

## 2016-01-08 VITALS — BP 128/86 | HR 76 | Ht 67.0 in | Wt 192.0 lb

## 2016-01-08 DIAGNOSIS — S83206A Unspecified tear of unspecified meniscus, current injury, right knee, initial encounter: Secondary | ICD-10-CM | POA: Insufficient documentation

## 2016-01-08 DIAGNOSIS — M25561 Pain in right knee: Secondary | ICD-10-CM

## 2016-01-08 MED ORDER — DICLOFENAC SODIUM 2 % TD SOLN: TRANSDERMAL | Status: DC

## 2016-01-08 NOTE — Progress Notes (Signed)
Tawana ScaleZach Smith D.O. [facility] [address] [place], [place] [address] Phone: [phone] Subjective:    I'm seeing this patient by the request  of:  Lorretta HarpPANOSH,WANDA KOTVAN, MD   CC: right Knee pain  OZH:[id number]:Subjective Roe CoombsMichele F Coolman is a 50 y.o. female coming in with complaint of right knee pain. Patient states that for the last several weeks she has been having increasing pain in her right knee. Patient states that it can severely worse after working in the yard recently. Did have one day where she had a sharp pain when she was doing a rotational movements did have swelling the next day. Since then has been ambulating with the limp. Patient denies any radiation pain. Seems to be more on the medial aspect of the knee. States that sometimes it is associated with swelling. Sometimes feels like it wants to lock on her but it has not. Denies any nighttime awakening. Denies any numbness. Rates the severity of pain though is 7 out of 10 most of the time.     Past Medical History  Diagnosis Date  . ADD (attention deficit disorder)     poss  . Depression   . Psoriasis   . Psoriatic arthritis (HCC) 10/2012   Past Surgical History  Procedure Laterality Date  . Bunionectomy      bone spur left foot  . Endometrial ablation     Social History   Social History  . Marital Status: Married    Spouse Name: N/A  . Number of Children: N/A  . Years of Education: N/A   Social History Main Topics  . Smoking status: Never Smoker   . Smokeless tobacco: None  . Alcohol Use: No  . Drug Use: Yes  . Sexual Activity: Not Asked   Other Topics Concern  . None   Social History Narrative    Married   No caffeine   Managing HH   Of 4    Pet dog   Regular exercise- yes  Not much in winter    BA degree         Allergies  Allergen Reactions  . Compazine Swelling    Throat swelling   Family History  Problem Relation Age of Onset  . Anxiety disorder    . Diabetes Father   . Glaucoma  Father   . Myasthenia gravis Mother   . Other Mother     [place]  . Other Brother     devic disease NMO neuromyelitis optica  . Hashimoto's thyroiditis Daughter   . Psoriasis Sister   . Depression Sister   . Arthritis      parents     Past medical history, social, surgical and family history all reviewed in electronic medical record.  No pertanent information unless stated regarding to the chief complaint.   Review of Systems: No headache, visual changes, nausea, vomiting, diarrhea, constipation, dizziness, abdominal pain, skin rash, fevers, chills, night sweats, weight loss, swollen lymph nodes, body aches, joint swelling, muscle aches, chest pain, shortness of breath, mood changes.   Objective Blood pressure 128/86, pulse 76, height 5\' 7"  (1.702 m), weight 192 lb (87.091 kg), SpO2 99 %.  General: No apparent distress alert and oriented x3 mood and affect normal, dressed appropriately.  HEENT: Pupils equal, extraocular movements intact  Respiratory: Patient's speak in full sentences and does not appear short of breath  Cardiovascular: No lower extremity edema, non tender, no erythema  Skin: Warm dry intact with no signs of  infection or rash on extremities or on axial skeleton.  Abdomen: Soft nontender  Neuro: Cranial nerves II through XII are intact, neurovascularly intact in all extremities with 2+ DTRs and 2+ pulses.  Lymph: No lymphadenopathy of posterior or anterior cervical chain or axillae bilaterally.  Gait normal with good balance and coordination.  MSK:  Non tender with full range of motion and good stability and symmetric strength and tone of shoulders, elbows, wrist, hip, and ankles bilaterally.  Knee: right Normal to inspection with no erythema or effusion or obvious bony abnormalities. Palpation normal with no warmth, joint line tenderness, patellar tenderness, or condyle tenderness. ROM full in flexion and extension and lower leg rotation. Ligaments with solid consistent  endpoints including ACL, PCL, LCL, MCL. Positive  Mcmurray's, Apley's, and Thessalonian tests. painful patellar compression. Patellar glide with mild crepitus. Patellar and quadriceps tendons unremarkable. Hamstring and quadriceps strength is normal.  Lateral knee has some mild crepitus is well  MSK US performed of: right This study was ordered, performed, and interpreted by Terrilee Files D.O.  Knee: All structures visualized. Patient does have an effusion of the suprapatellar pouch Patient's right medial meniscal anterior aspect does have what appears to be an acute tear. Increasing Doppler flow. Mild displacement noted. Patellar Tendon unremarkable on long and transverse views without effusion. No abnormality of prepatellar bursa. LCL and MCL unremarkable on long and transverse views. No abnormality of origin of medial or lateral head of the gastrocnemius.  IMPRESSION:  Medial meniscal tear was 5% displacement  Procedure note 97110; 15 minutes spent for Therapeutic exercises as stated in above notes.  This included exercises focusing on stretching, strengthening, with significant focus on eccentric aspects.  Flexion and extension exercises focusing on VMO and hip abduction.  Proper technique shown and discussed handout in great detail with ATC.  All questions were discussed and answered.      Impression and Recommendations:     This case required medical decision making of moderate complexity.      Note: This dictation was prepared with Dragon dictation along with smaller phrase technology. Any transcriptional errors that result from this process are unintentional.

## 2016-01-08 NOTE — Patient Instructions (Signed)
Great to see you  Gustavus Bryantce is your friend Ice 20 minutes 2 times daily. Usually after activity and before bed. Stay active. Avoid twisting and bending pennsaid pinkie amount topically 2 times daily as needed.  Duexis 3 times a day for 6 days. Stop if it hurts your stomach  Keep working on the weight loss, I know you will do great!! See me again in 4 week sand if not better we will discuss injections and possible physical therapy.

## 2016-01-08 NOTE — Progress Notes (Signed)
Pre visit review using our clinic review tool, if applicable. No additional management support is needed unless otherwise documented below in the visit note. 

## 2016-01-08 NOTE — Assessment & Plan Note (Signed)
Patient does have a tear noted today. This seems to be acute. Patient given home exercises and work with Event organiserathletic trainer. We discussed icing regimen, discussed which activities to do in which ones to avoid. Discussed the importance of weight loss, discussed proper shoes. Patient given topical anti-inflammatories as well as oral anti-inflammatory's. Patient will come back and see me again in 3-4 weeks. At that time if having worsening symptoms we may need to consider injection and if continued effusions and we will do aspiration.

## 2016-02-04 ENCOUNTER — Encounter: Payer: Self-pay | Admitting: Family Medicine

## 2016-02-04 ENCOUNTER — Ambulatory Visit (INDEPENDENT_AMBULATORY_CARE_PROVIDER_SITE_OTHER): Payer: BLUE CROSS/BLUE SHIELD | Admitting: Family Medicine

## 2016-02-04 VITALS — BP 122/76 | HR 72 | Wt 181.0 lb

## 2016-02-04 DIAGNOSIS — S83206D Unspecified tear of unspecified meniscus, current injury, right knee, subsequent encounter: Secondary | ICD-10-CM

## 2016-02-04 DIAGNOSIS — L4050 Arthropathic psoriasis, unspecified: Secondary | ICD-10-CM

## 2016-02-04 NOTE — Progress Notes (Signed)
Melissa ScaleZach Walters D.O. [facility] [address] [place], [place] [address] Phone: [phone] Subjective:    I'm seeing this patient by the request  of:  Melissa HarpPANOSH,Melissa KOTVAN, MD   CC: right Knee pain f/u  BJY:[id number]:Subjective Melissa Walters is a 50 y.o. female coming in with complaint of right knee pain. Patient was found to have a meniscal tear with some mild displacement as well as an effusion. Patient will try conservative therapy with home exercises, oral anti-inflammatories, icing and avoiding certain activities. Patient states She is feeling approximately 90% better. Has started to increase her walking And has not notice any increasing pain or swelling. Overall feels like she has been some significant improvement. Does not feel that she is having a flare of any of her psoriatic arthritis. Happy with the results so far.     Past Medical History  Diagnosis Date  . ADD (attention deficit disorder)     poss  . Depression   . Psoriasis   . Psoriatic arthritis (HCC) 10/2012   Past Surgical History  Procedure Laterality Date  . Bunionectomy      bone spur left foot  . Endometrial ablation     Social History   Social History  . Marital Status: Married    Spouse Name: N/A  . Number of Children: N/A  . Years of Education: N/A   Social History Main Topics  . Smoking status: Never Smoker   . Smokeless tobacco: None  . Alcohol Use: No  . Drug Use: Yes  . Sexual Activity: Not Asked   Other Topics Concern  . None   Social History Narrative    Married   No caffeine   Managing HH   Of 4    Pet dog   Regular exercise- yes  Not much in winter    BA degree         Allergies  Allergen Reactions  . Compazine Swelling    Throat swelling   Family History  Problem Relation Age of Onset  . Anxiety disorder    . Diabetes Father   . Glaucoma Father   . Myasthenia gravis Mother   . Other Mother     [place]  . Other Brother     devic disease NMO neuromyelitis optica  .  Hashimoto's thyroiditis Daughter   . Psoriasis Sister   . Depression Sister   . Arthritis      parents     Past medical history, social, surgical and family history all reviewed in electronic medical record.  No pertanent information unless stated regarding to the chief complaint.   Review of Systems: No headache, visual changes, nausea, vomiting, diarrhea, constipation, dizziness, abdominal pain, skin rash, fevers, chills, night sweats, weight loss, swollen lymph nodes, body aches, joint swelling, muscle aches, chest pain, shortness of breath, mood changes.   Objective Blood pressure 122/76, pulse 72, weight 181 lb (82.101 kg).  General: No apparent distress alert and oriented x3 mood and affect normal, dressed appropriately.  HEENT: Pupils equal, extraocular movements intact  Respiratory: Patient's speak in full sentences and does not appear short of breath  Cardiovascular: No lower extremity edema, non tender, no erythema  Skin: Warm dry intact with no signs of infection or rash on extremities or on axial skeleton.  Abdomen: Soft nontender  Neuro: Cranial nerves II through XII are intact, neurovascularly intact in all extremities with 2+ DTRs and 2+ pulses.  Lymph: No lymphadenopathy of posterior or anterior cervical chain  or axillae bilaterally.  Gait normal with good balance and coordination.  MSK:  Non tender with full range of motion and good stability and symmetric strength and tone of shoulders, elbows, wrist, hip, and ankles bilaterally.  Knee: right Normal to inspection with no erythema or effusion or obvious bony abnormalities. Palpation normal with no warmth, joint line tenderness, patellar tenderness, or condyle tenderness. ROM full in flexion and extension and lower leg rotation. Ligaments with solid consistent endpoints including ACL, PCL, LCL, MCL. Negative Mcmurray's, Apley's, and Thessalonian tests. painful patellar compression. Patellar glide with mild  crepitus. Patellar and quadriceps tendons unremarkable. Hamstring and quadriceps strength is normal.  Lateral knee has some mild crepitus is well         Impression and Recommendations:     This case required medical decision making of moderate complexity.      Note: This dictation was prepared with Dragon dictation along with smaller phrase technology. Any transcriptional errors that result from this process are unintentional.

## 2016-02-04 NOTE — Patient Instructions (Signed)
Good to see you Ice is your friend We are making some progress.  Very mild swelling When you increase activity either time, duration or elevation please ice the knee right afterward I do think a compression sleeve (even from [facility]) could be helpful to delay swelling.  Wear with activity and 30 minutes afterward.  See me again in 6 weeks (right before your walk) and if we need we will do an injection.

## 2016-02-04 NOTE — Assessment & Plan Note (Signed)
We'll continue to monitor but I do not see any signs and symptoms of any inflammation at the moment. Only having one joint that seems to be affecting her at this time.

## 2016-02-04 NOTE — Assessment & Plan Note (Signed)
Patient is not showing any signs of the McMurray's at this time. Likely is healing appropriately with conservative therapy. We did discuss the possibility of formal physical therapy which patient declined. Patient still has topical anti-inflammatories if necessary. Patient will slowly start increasing her activity as tolerated. As the goal of doing a walk in 7 weeks of 20 miles. Discussed with patient to see her again in 6 weeks. Any worsening symptoms or mild symptoms possible injection to help stimulate healing. Patient will follow-up with me again in 6 weeks then.  Spent  25 minutes with patient face-to-face and had greater than 50% of counseling including as described above in assessment and plan.

## 2016-02-11 DIAGNOSIS — H00022 Hordeolum internum right lower eyelid: Secondary | ICD-10-CM | POA: Diagnosis not present

## 2016-03-20 NOTE — Progress Notes (Deleted)
Tawana ScaleZach Odean Mcelwain D.O. [facility] [address] [place], [place] [address] Phone: [phone] Subjective:    I'm seeing this patient by the request  of:  Lorretta HarpPANOSH,WANDA KOTVAN, MD   CC: right Knee pain f/u  QIO:[id number]:Subjective  Melissa Walters is a 50 y.o. female coming in with complaint of right knee pain. Patient was found to have a meniscal tear with some mild displacement as well as an effusion.  past medical history significant for psoriatic arthritis.. Patient 6 weeks ago is doing well with conservative therapy and was 90% better. Patient does have a long walk coming up. Patient was concern about continuing. Has not had any locking or giving out.    Past Medical History:  Diagnosis Date  . ADD (attention deficit disorder)    poss  . Depression   . Psoriasis   . Psoriatic arthritis (HCC) 10/2012   Past Surgical History:  Procedure Laterality Date  . BUNIONECTOMY     bone spur left foot  . ENDOMETRIAL ABLATION     Social History   Social History  . Marital status: Married    Spouse name: N/A  . Number of children: N/A  . Years of education: N/A   Social History Main Topics  . Smoking status: Never Smoker  . Smokeless tobacco: Not on file  . Alcohol use No  . Drug use:   . Sexual activity: Not on file   Other Topics Concern  . Not on file   Social History Narrative    Married   No caffeine   Managing HH   Of 4    Pet dog   Regular exercise- yes  Not much in winter    BA degree         Allergies  Allergen Reactions  . Compazine Swelling    Throat swelling   Family History  Problem Relation Age of Onset  . Anxiety disorder    . Diabetes Father   . Glaucoma Father   . Myasthenia gravis Mother   . Other Mother     [place]  . Other Brother     devic disease NMO neuromyelitis optica  . Hashimoto's thyroiditis Daughter   . Psoriasis Sister   . Depression Sister   . Arthritis      parents     Past medical history, social, surgical and family history  all reviewed in electronic medical record.  No pertanent information unless stated regarding to the chief complaint.   Review of Systems: No headache, visual changes, nausea, vomiting, diarrhea, constipation, dizziness, abdominal pain, skin rash, fevers, chills, night sweats, weight loss, swollen lymph nodes, body aches, joint swelling, muscle aches, chest pain, shortness of breath, mood changes.   Objective  There were no vitals taken for this visit.  General: No apparent distress alert and oriented x3 mood and affect normal, dressed appropriately.  HEENT: Pupils equal, extraocular movements intact  Respiratory: Patient's speak in full sentences and does not appear short of breath  Cardiovascular: No lower extremity edema, non tender, no erythema  Skin: Warm dry intact with no signs of infection or rash on extremities or on axial skeleton.  Abdomen: Soft nontender  Neuro: Cranial nerves II through XII are intact, neurovascularly intact in all extremities with 2+ DTRs and 2+ pulses.  Lymph: No lymphadenopathy of posterior or anterior cervical chain or axillae bilaterally.  Gait normal with good balance and coordination.  MSK:  Non tender with full range of motion and good stability  and symmetric strength and tone of shoulders, elbows, wrist, hip, and ankles bilaterally.  Knee: right Normal to inspection with no erythema or effusion or obvious bony abnormalities. Palpation normal with no warmth, joint line tenderness, patellar tenderness, or condyle tenderness. ROM full in flexion and extension and lower leg rotation. Ligaments with solid consistent endpoints including ACL, PCL, LCL, MCL. Negative Mcmurray's, Apley's, and Thessalonian tests. painful patellar compression. Patellar glide with mild crepitus. Patellar and quadriceps tendons unremarkable. Hamstring and quadriceps strength is normal.  Contralateral knee has some mild crepitus is well   After informed written and verbal consent,  patient was seated on exam table. Right knee was prepped with alcohol swab and utilizing anterolateral approach, patient's right knee space was injected with 4:1  marcaine 0.5%: Kenalog /dL. Patient tolerated the procedure well without immediate complications.      Impression and Recommendations:     This case required medical decision making of moderate complexity.      Note: This dictation was prepared with Dragon dictation along with smaller phrase technology. Any transcriptional errors that result from this process are unintentional.

## 2016-03-21 ENCOUNTER — Ambulatory Visit: Payer: BLUE CROSS/BLUE SHIELD | Admitting: Family Medicine

## 2016-03-23 NOTE — Progress Notes (Signed)
[name] [address] [place], [place] [address] Phone: [phone] Subjective:    I'm seeing this patient by the request  of:  Lorretta Harp, MD   CC: right Knee pain f/u  OZH:YQMVHQIONG  Melissa Walters is a 50 y.o. female coming in with complaint of right knee pain. Patient was found to have a meniscal tear with some mild displacement as well as an effusion.  past medical history significant for psoriatic arthritis.. Patient Weeks ago. Patient overall seems to be doing very well. States that she is 95% better. Not having any significant inflammation of the time. Patient states that her left knee seems be hurting her more than the right knee. Doing the exercises fairly regularly.    Past Medical History:  Diagnosis Date  . ADD (attention deficit disorder)    poss  . Depression   . Psoriasis   . Psoriatic arthritis (HCC) 10/2012   Past Surgical History:  Procedure Laterality Date  . BUNIONECTOMY     bone spur left foot  . ENDOMETRIAL ABLATION     Social History   Social History  . Marital status: Married    Spouse name: N/A  . Number of children: N/A  . Years of education: N/A   Social History Main Topics  . Smoking status: Never Smoker  . Smokeless tobacco: None  . Alcohol use No  . Drug use:   . Sexual activity: Not Asked   Other Topics Concern  . None   Social History Narrative    Married   No caffeine   Managing HH   Of 4    Pet dog   Regular exercise- yes  Not much in winter    BA degree         Allergies  Allergen Reactions  . Compazine Swelling    Throat swelling   Family History  Problem Relation Age of Onset  . Anxiety disorder    . Diabetes Father   . Glaucoma Father   . Myasthenia gravis Mother   . Other Mother     [place]  . Other Brother     devic disease NMO neuromyelitis optica  . Hashimoto's thyroiditis Daughter   . Psoriasis Sister   . Depression Sister   . Arthritis      parents     Past  medical history, social, surgical and family history all reviewed in electronic medical record.  No pertanent information unless stated regarding to the chief complaint.   Review of Systems: No headache, visual changes, nausea, vomiting, diarrhea, constipation, dizziness, abdominal pain, skin rash, fevers, chills, night sweats, weight loss, swollen lymph nodes, body aches, joint swelling, muscle aches, chest pain, shortness of breath, mood changes.   Objective  Blood pressure 130/84, pulse 85, weight 177 lb (80.3 kg), SpO2 96 %.  General: No apparent distress alert and oriented x3 mood and affect normal, dressed appropriately.  HEENT: Pupils equal, extraocular movements intact  Respiratory: Patient's speak in full sentences and does not appear short of breath  Cardiovascular: No lower extremity edema, non tender, no erythema  Skin: Warm dry intact with no signs of infection or rash on extremities or on axial skeleton.  Abdomen: Soft nontender  Neuro: Cranial nerves II through XII are intact, neurovascularly intact in all extremities with 2+ DTRs and 2+ pulses.  Lymph: No lymphadenopathy of posterior or anterior cervical chain or axillae bilaterally.  Gait normal with good balance and coordination.  MSK:  Non tender with full range of motion and good stability and symmetric strength and tone of shoulders, elbows, wrist, hip, and ankles bilaterally.  Knee: right Normal to inspection with no erythema or effusion or obvious bony abnormalities. Palpation normal with no warmth, joint line tenderness, patellar tenderness, or condyle tenderness. ROM full in flexion and extension and lower leg rotation. Ligaments with solid consistent endpoints including ACL, PCL, LCL, MCL. Negative Mcmurray's, Apley's, and Thessalonian tests. painful patellar compression. Patellar glide with mild crepitus. Patellar and quadriceps tendons unremarkable. Hamstring and quadriceps strength is normal.  Contralateral knee  has Mild lateral tilt of the patella with mild crepitus with range of motion.        Impression and Recommendations:     This case required medical decision making of moderate complexity.      Note: This dictation was prepared with Dragon dictation along with smaller phrase technology. Any transcriptional errors that result from this process are unintentional.

## 2016-03-24 ENCOUNTER — Encounter: Payer: Self-pay | Admitting: Family Medicine

## 2016-03-24 ENCOUNTER — Ambulatory Visit (INDEPENDENT_AMBULATORY_CARE_PROVIDER_SITE_OTHER): Payer: BLUE CROSS/BLUE SHIELD | Admitting: Family Medicine

## 2016-03-24 DIAGNOSIS — S83206D Unspecified tear of unspecified meniscus, current injury, right knee, subsequent encounter: Secondary | ICD-10-CM | POA: Diagnosis not present

## 2016-03-24 MED ORDER — MELOXICAM 15 MG PO TABS: 15.0000 mg | ORAL_TABLET | Freq: Every day | ORAL | 0 refills | Status: DC

## 2016-03-24 NOTE — Assessment & Plan Note (Signed)
Overall patient seems to be doing relatively well. We discussed icing regimen and home exercises. We discussed which activities to do. Patient overall seems to be doing relatively well and can follow-up with me as needed. Patient will make an appointment on 2 weeks just in case with her doing her long 70 mile walk.

## 2016-03-24 NOTE — Patient Instructions (Signed)
Good to see you  You are doing great  Ice right after any activity including your walk Start meloxicam daily 2 days before the walk and until 2 days after the walk then can use it as neeed Show your trainer these exercises andd have them stop deep squats or jumping.  See me maybe in 2 weeks if the walk does hurt.  OK to keep walking but cross train with biking You should be proud of yourself.  You look great!

## 2016-04-05 NOTE — Progress Notes (Deleted)
[name] [address] [place], [place] [address] Phone: [phone] Subjective:    I'm seeing this patient by the request  of:  Lorretta Harp, MD   CC: right Knee pain f/u  [id number]  Melissa Walters is a 50 y.o. female coming in with complaint of right knee pain. Patient was found to have a meniscal tear with some mild displacement as well as an effusion.  past medical history significant for psoriatic arthritis.. Patient Weeks ago. Patient overall seems to be doing very well. States that she is 95% better. Not having any significant inflammation of the time. Patient states that her left knee seems be hurting her more than the right knee. Doing the exercises fairly regularly.    Past Medical History:  Diagnosis Date  . ADD (attention deficit disorder)    poss  . Depression   . Psoriasis   . Psoriatic arthritis (HCC) 10/2012   Past Surgical History:  Procedure Laterality Date  . BUNIONECTOMY     bone spur left foot  . ENDOMETRIAL ABLATION     Social History   Social History  . Marital status: Married    Spouse name: N/A  . Number of children: N/A  . Years of education: N/A   Social History Main Topics  . Smoking status: Never Smoker  . Smokeless tobacco: Not on file  . Alcohol use No  . Drug use:   . Sexual activity: Not on file   Other Topics Concern  . Not on file   Social History Narrative    Married   No caffeine   Managing HH   Of 4    Pet dog   Regular exercise- yes  Not much in winter    BA degree         Allergies  Allergen Reactions  . Compazine Swelling    Throat swelling   Family History  Problem Relation Age of Onset  . Anxiety disorder    . Diabetes Father   . Glaucoma Father   . Myasthenia gravis Mother   . Other Mother     [place]  . Other Brother     devic disease NMO neuromyelitis optica  . Hashimoto's thyroiditis Daughter   . Psoriasis Sister   . Depression Sister   . Arthritis     parents     Past medical history, social, surgical and family history all reviewed in electronic medical record.  No pertanent information unless stated regarding to the chief complaint.   Review of Systems: No headache, visual changes, nausea, vomiting, diarrhea, constipation, dizziness, abdominal pain, skin rash, fevers, chills, night sweats, weight loss, swollen lymph nodes, body aches, joint swelling, muscle aches, chest pain, shortness of breath, mood changes.   Objective  There were no vitals taken for this visit.  General: No apparent distress alert and oriented x3 mood and affect normal, dressed appropriately.  HEENT: Pupils equal, extraocular movements intact  Respiratory: Patient's speak in full sentences and does not appear short of breath  Cardiovascular: No lower extremity edema, non tender, no erythema  Skin: Warm dry intact with no signs of infection or rash on extremities or on axial skeleton.  Abdomen: Soft nontender  Neuro: Cranial nerves II through XII are intact, neurovascularly intact in all extremities with 2+ DTRs and 2+ pulses.  Lymph: No lymphadenopathy of posterior or anterior cervical chain or axillae bilaterally.  Gait normal with good balance and coordination.  MSK:  Non tender  with full range of motion and good stability and symmetric strength and tone of shoulders, elbows, wrist, hip, and ankles bilaterally.  Knee: right Normal to inspection with no erythema or effusion or obvious bony abnormalities. Palpation normal with no warmth, joint line tenderness, patellar tenderness, or condyle tenderness. ROM full in flexion and extension and lower leg rotation. Ligaments with solid consistent endpoints including ACL, PCL, LCL, MCL. Negative Mcmurray's, Apley's, and Thessalonian tests. painful patellar compression. Patellar glide with mild crepitus. Patellar and quadriceps tendons unremarkable. Hamstring and quadriceps strength is normal.  Contralateral knee has  Mild lateral tilt of the patella with mild crepitus with range of motion.        Impression and Recommendations:     This case required medical decision making of moderate complexity.      Note: This dictation was prepared with Dragon dictation along with smaller phrase technology. Any transcriptional errors that result from this process are unintentional.

## 2016-04-07 ENCOUNTER — Ambulatory Visit: Payer: BLUE CROSS/BLUE SHIELD | Admitting: Family Medicine

## 2016-05-01 DIAGNOSIS — F3181 Bipolar II disorder: Secondary | ICD-10-CM | POA: Diagnosis not present

## 2016-06-09 DIAGNOSIS — R1010 Upper abdominal pain, unspecified: Secondary | ICD-10-CM | POA: Diagnosis not present

## 2016-06-09 DIAGNOSIS — M940 Chondrocostal junction syndrome [Tietze]: Secondary | ICD-10-CM | POA: Diagnosis not present

## 2016-06-10 ENCOUNTER — Ambulatory Visit: Payer: BLUE CROSS/BLUE SHIELD | Admitting: Family Medicine

## 2016-06-25 NOTE — Progress Notes (Signed)
[facility] D.O[facility] [address] [place], [place] [address] Phone: [phone] Subjective:    I'm seeing this patient by the request  of:    CC: Possible rib fractures  [id number]:Subjective  [name] is a 50 y.o. female coming in with complaint of ossicle rib fractures. Past medical history is significant for psoriatic arthritis. Patient states She was leaning over a trash can and unfortunately felt a sharp pain on the left side of her side. Patient states it Worse over the next couple days. Was seen in urgent care facility where she had x-rays which were unremarkable for any type or rib fracture. This was 3 weeks ago. Continuing have pain. Unable to workout, deep breaths give her more pain. Patient is concerned she does not seem to be improving and all the course of time. Denies any fevers, chills, any abnormal weight loss.    Past Medical History:  Diagnosis Date  . ADD (attention deficit disorder)    poss  . Depression   . Psoriasis   . Psoriatic arthritis (HCC) 10/2012   Past Surgical History:  Procedure Laterality Date  . BUNIONECTOMY     bone spur left foot  . ENDOMETRIAL ABLATION     Social History   Social History  . Marital status: Married    Spouse name: N/A  . Number of children: N/A  . Years of education: N/A   Social History Main Topics  . Smoking status: Never Smoker  . Smokeless tobacco: None  . Alcohol use No  . Drug use:   . Sexual activity: Not Asked   Other Topics Concern  . None   Social History Narrative    Married   No caffeine   Managing HH   Of 4    Pet dog   Regular exercise- yes  Not much in winter    BA degree         Allergies  Allergen Reactions  . Compazine Swelling    Throat swelling   Family History  Problem Relation Age of Onset  . Anxiety disorder    . Diabetes Father   . Glaucoma Father   . Myasthenia gravis Mother   . Other Mother     [place]  . Other Brother     devic disease NMO  neuromyelitis optica  . Hashimoto's thyroiditis Daughter   . Psoriasis Sister   . Depression Sister   . Arthritis      parents     Past medical history, social, surgical and family history all reviewed in electronic medical record.  No pertanent information unless stated regarding to the chief complaint.   Review of Systems:Review of systems updated and as accurate as of 06/26/16  No headache, visual changes, nausea, vomiting, diarrhea, constipation, dizziness, abdominal pain, skin rash, fevers, chills, night sweats, weight loss, swollen lymph nodes, body aches, joint swelling, muscle aches, chest pain, shortness of breath, mood changes.   Objective  Blood pressure 136/84, pulse 75, height 5\' 7"  (1.702 m), weight 179 lb (81.2 kg), SpO2 95 %. Systems examined below as of 06/26/16   General: No apparent distress alert and oriented x3 mood and affect normal, dressed appropriately.  HEENT: Pupils equal, extraocular movements intact  Respiratory: Patient's speak in full sentences and does not appear short of breath  Cardiovascular: No lower extremity edema, non tender, no erythema  Skin: Warm dry intact with no signs of infection or rash on extremities or on axial skeleton.  Abdomen: Patient does have a step-off noted of the anterior aspect of ribs 8 and 9 on the left side compared to the contralateral side. No crepitus noted. Minor tenderness to palpation in this area. Patient is more tender in the left upper quadrant even light sensation. Mild rebound tenderness noted. No epigastric pain. No masses palpated but potential enlargement of the spleen noted under the costal angle. No back pain associated with it. No skin changes. Neuro: Cranial nerves II through XII are intact, neurovascularly intact in all extremities with 2+ DTRs and 2+ pulses.  Lymph: No lymphadenopathy of posterior or anterior cervical chain or axillae bilaterally.  Gait normal with good balance and coordination.  MSK:  Non  tender with full range of motion and good stability and symmetric strength and tone of shoulders, elbows, wrist, hip, knee and ankles bilaterally.     Impression and Recommendations:     This case required medical decision making of moderate complexity.      Note: This dictation was prepared with Dragon dictation along with smaller phrase technology. Any transcriptional errors that result from this process are unintentional.

## 2016-06-26 ENCOUNTER — Encounter: Payer: Self-pay | Admitting: Family Medicine

## 2016-06-26 ENCOUNTER — Ambulatory Visit (INDEPENDENT_AMBULATORY_CARE_PROVIDER_SITE_OTHER): Payer: BLUE CROSS/BLUE SHIELD | Admitting: Family Medicine

## 2016-06-26 VITALS — BP 136/84 | HR 75 | Ht 67.0 in | Wt 179.0 lb

## 2016-06-26 DIAGNOSIS — R1012 Left upper quadrant pain: Secondary | ICD-10-CM | POA: Insufficient documentation

## 2016-06-26 MED ORDER — GABAPENTIN 100 MG PO CAPS: 200.0000 mg | ORAL_CAPSULE | Freq: Every day | ORAL | 3 refills | Status: DC

## 2016-06-26 MED ORDER — VITAMIN D (ERGOCALCIFEROL) 1.25 MG (50000 UNIT) PO CAPS
50000.0000 [IU] | ORAL_CAPSULE | ORAL | 0 refills | Status: DC
Start: 2016-06-26 — End: 2017-08-17

## 2016-06-26 NOTE — Assessment & Plan Note (Signed)
Differential is quite broad at this time. Potential injury to the spleen could have occurred a could be a possible hematoma. We will get an ultrasound to further evaluate. I think that this is highly unlikely and probably would've presented itself earlier. Intercostal muscle injury or even abdominal injury could be appreciated. Patient could have a hernia but nothing was palpated today. Could just be referred pain from the healing of the rib fractures. Patient was started on vitamin D and depending on the ultrasound we will advance patient's activity accordingly. Patient will come back and see me again in 2 weeks to make sure she is improving and if worsening symptoms to seek medical attention immediately.

## 2016-06-26 NOTE — Patient Instructions (Signed)
Good to see you  Next time just let it go! We will get an ultrasound to make sure I am not missing anything.  Ice 20 minutes 2 times daily. Usually after activity and before bed. pennsaid pinkie amount topically 2 times daily as needed.  Gabapentin 200mg  at night After the ultrasound I will send you a message and make sure your are good to workout. See me again in 2 weeks to make sure you are better.

## 2016-07-09 ENCOUNTER — Other Ambulatory Visit: Payer: BLUE CROSS/BLUE SHIELD

## 2016-07-10 NOTE — Progress Notes (Deleted)
Melissa Walters D.O. [facility] [address] [place], [place] [address] Phone: [phone] Subjective:    I'm seeing this patient by the request  of:    CC: Possible rib fractures f/u   BJY:[id number]:Subjective  Melissa CoombsMichele F Leavitt is a 50 y.o. female coming in with complaint of ossicle rib fractures. Past medical history is significant for psoriatic arthritis.  Patient was seen by me and there was a concern for patient having rib fractures. Patient was severely tender over the spleen itself. Patient was to get abdominal ultrasound and was to seek medical attention immediately if pain worsens severely. Patient states    Past Medical History:  Diagnosis Date  . ADD (attention deficit disorder)    poss  . Depression   . Psoriasis   . Psoriatic arthritis (HCC) 10/2012   Past Surgical History:  Procedure Laterality Date  . BUNIONECTOMY     bone spur left foot  . ENDOMETRIAL ABLATION     Social History   Social History  . Marital status: Married    Spouse name: N/A  . Number of children: N/A  . Years of education: N/A   Social History Main Topics  . Smoking status: Never Smoker  . Smokeless tobacco: Not on file  . Alcohol use No  . Drug use:   . Sexual activity: Not on file   Other Topics Concern  . Not on file   Social History Narrative    Married   No caffeine   Managing HH   Of 4    Pet dog   Regular exercise- yes  Not much in winter    BA degree         Allergies  Allergen Reactions  . Compazine Swelling    Throat swelling   Family History  Problem Relation Age of Onset  . Anxiety disorder    . Diabetes Father   . Glaucoma Father   . Myasthenia gravis Mother   . Other Mother     [place]  . Other Brother     devic disease NMO neuromyelitis optica  . Hashimoto's thyroiditis Daughter   . Psoriasis Sister   . Depression Sister   . Arthritis      parents     Past medical history, social, surgical and family history all reviewed in electronic medical  record.  No pertanent information unless stated regarding to the chief complaint.   Review of Systems:Review of systems updated and as accurate as of 07/10/16  No headache, visual changes, nausea, vomiting, diarrhea, constipation, dizziness, abdominal pain, skin rash, fevers, chills, night sweats, weight loss, swollen lymph nodes, body aches, joint swelling, muscle aches, chest pain, shortness of breath, mood changes.   Objective  There were no vitals taken for this visit. Systems examined below as of 07/10/16   General: No apparent distress alert and oriented x3 mood and affect normal, dressed appropriately.  HEENT: Pupils equal, extraocular movements intact  Respiratory: Patient's speak in full sentences and does not appear short of breath  Cardiovascular: No lower extremity edema, non tender, no erythema  Skin: Warm dry intact with no signs of infection or rash on extremities or on axial skeleton.  Abdomen: Patient does have a step-off noted of the anterior aspect of ribs 8 and 9 on the left side compared to the contralateral side. No crepitus noted. Minor tenderness to palpation in this area. Patient is more tender in the left upper quadrant even light sensation. Mild rebound tenderness noted.  No epigastric pain. No masses palpated but potential enlargement of the spleen noted under the costal angle. No back pain associated with it. No skin changes. Neuro: Cranial nerves II through XII are intact, neurovascularly intact in all extremities with 2+ DTRs and 2+ pulses.  Lymph: No lymphadenopathy of posterior or anterior cervical chain or axillae bilaterally.  Gait normal with good balance and coordination.  MSK:  Non tender with full range of motion and good stability and symmetric strength and tone of shoulders, elbows, wrist, hip, knee and ankles bilaterally.     Impression and Recommendations:     This case required medical decision making of moderate complexity.      Note: This  dictation was prepared with Dragon dictation along with smaller phrase technology. Any transcriptional errors that result from this process are unintentional.

## 2016-07-11 ENCOUNTER — Ambulatory Visit: Payer: BLUE CROSS/BLUE SHIELD | Admitting: Family Medicine

## 2016-07-15 ENCOUNTER — Encounter: Payer: Self-pay | Admitting: Family Medicine

## 2016-07-21 ENCOUNTER — Other Ambulatory Visit: Payer: BLUE CROSS/BLUE SHIELD

## 2016-07-30 ENCOUNTER — Other Ambulatory Visit: Payer: Self-pay | Admitting: Obstetrics and Gynecology

## 2016-07-30 DIAGNOSIS — Z1231 Encounter for screening mammogram for malignant neoplasm of breast: Secondary | ICD-10-CM | POA: Diagnosis not present

## 2016-07-30 DIAGNOSIS — Z124 Encounter for screening for malignant neoplasm of cervix: Secondary | ICD-10-CM | POA: Diagnosis not present

## 2016-07-30 DIAGNOSIS — Z01419 Encounter for gynecological examination (general) (routine) without abnormal findings: Secondary | ICD-10-CM | POA: Diagnosis not present

## 2016-07-30 DIAGNOSIS — R300 Dysuria: Secondary | ICD-10-CM | POA: Diagnosis not present

## 2016-07-30 DIAGNOSIS — Z6828 Body mass index (BMI) 28.0-28.9, adult: Secondary | ICD-10-CM | POA: Diagnosis not present

## 2016-07-31 LAB — CYTOLOGY - PAP

## 2016-09-02 DIAGNOSIS — D485 Neoplasm of uncertain behavior of skin: Secondary | ICD-10-CM | POA: Diagnosis not present

## 2016-09-02 DIAGNOSIS — L570 Actinic keratosis: Secondary | ICD-10-CM | POA: Diagnosis not present

## 2016-10-29 ENCOUNTER — Telehealth: Payer: Self-pay | Admitting: Internal Medicine

## 2016-10-29 NOTE — Telephone Encounter (Signed)
° ° °  Pt called to say do not cancel her lab visit that she would contact her insurance company and verify they will pay for her lab. Pt was advised that if insurance company does not cover all her labs that she will be responsible for what they do not pay . Pt understood and said ok

## 2016-10-31 ENCOUNTER — Telehealth: Payer: Self-pay | Admitting: Internal Medicine

## 2016-10-31 ENCOUNTER — Other Ambulatory Visit (INDEPENDENT_AMBULATORY_CARE_PROVIDER_SITE_OTHER): Payer: BLUE CROSS/BLUE SHIELD

## 2016-10-31 DIAGNOSIS — Z0000 Encounter for general adult medical examination without abnormal findings: Secondary | ICD-10-CM

## 2016-10-31 DIAGNOSIS — F3181 Bipolar II disorder: Secondary | ICD-10-CM | POA: Diagnosis not present

## 2016-10-31 DIAGNOSIS — Z79899 Other long term (current) drug therapy: Secondary | ICD-10-CM | POA: Diagnosis not present

## 2016-10-31 DIAGNOSIS — E559 Vitamin D deficiency, unspecified: Secondary | ICD-10-CM | POA: Diagnosis not present

## 2016-10-31 LAB — CBC WITH DIFFERENTIAL/PLATELET
Basophils Absolute: 0 10*3/uL (ref 0.0–0.1)
Basophils Relative: 1 % (ref 0.0–3.0)
EOS ABS: 0.1 10*3/uL (ref 0.0–0.7)
Eosinophils Relative: 3.6 % (ref 0.0–5.0)
HCT: 36.9 % (ref 36.0–46.0)
Hemoglobin: 12.8 g/dL (ref 12.0–15.0)
LYMPHS ABS: 1.5 10*3/uL (ref 0.7–4.0)
LYMPHS PCT: 38.1 % (ref 12.0–46.0)
MCHC: 34.7 g/dL (ref 30.0–36.0)
MCV: 89.8 fl (ref 78.0–100.0)
MONO ABS: 0.4 10*3/uL (ref 0.1–1.0)
MONOS PCT: 10.3 % (ref 3.0–12.0)
Neutro Abs: 1.9 10*3/uL (ref 1.4–7.7)
Neutrophils Relative %: 47 % (ref 43.0–77.0)
PLATELETS: 245 10*3/uL (ref 150.0–400.0)
RBC: 4.11 Mil/uL (ref 3.87–5.11)
RDW: 12.9 % (ref 11.5–15.5)
WBC: 4 10*3/uL (ref 4.0–10.5)

## 2016-10-31 LAB — LIPID PANEL
CHOL/HDL RATIO: 2
CHOLESTEROL: 225 mg/dL — AB (ref 0–200)
HDL: 114.8 mg/dL (ref >39.00)
LDL Cholesterol: 99 mg/dL (ref 0–99)
NonHDL: 109.81
TRIGLYCERIDES: 52 mg/dL (ref 0.0–149.0)
VLDL: 10.4 mg/dL (ref 0.0–40.0)

## 2016-10-31 LAB — TSH: TSH: 0.36 u[IU]/mL (ref 0.35–4.50)

## 2016-10-31 LAB — BASIC METABOLIC PANEL
BUN: 8 mg/dL (ref 6–23)
CALCIUM: 9.4 mg/dL (ref 8.4–10.5)
CO2: 25 mEq/L (ref 19–32)
Chloride: 101 mEq/L (ref 96–112)
Creatinine, Ser: 0.65 mg/dL (ref 0.40–1.20)
GFR: 101.58 mL/min (ref >60.00)
Glucose, Bld: 100 mg/dL — ABNORMAL HIGH (ref 70–99)
Potassium: 4.1 mEq/L (ref 3.5–5.1)
SODIUM: 134 meq/L — AB (ref 135–145)

## 2016-10-31 LAB — HEPATIC FUNCTION PANEL
ALT: 9 U/L (ref 0–35)
AST: 12 U/L (ref 0–37)
Albumin: 4.4 g/dL (ref 3.5–5.2)
Alkaline Phosphatase: 67 U/L (ref 39–117)
BILIRUBIN DIRECT: 0.1 mg/dL (ref 0.0–0.3)
BILIRUBIN TOTAL: 0.3 mg/dL (ref 0.2–1.2)
TOTAL PROTEIN: 6.6 g/dL (ref 6.0–8.3)

## 2016-10-31 LAB — VITAMIN D 25 HYDROXY (VIT D DEFICIENCY, FRACTURES): VITD: 36.36 ng/mL (ref 30.00–100.00)

## 2016-10-31 LAB — VITAMIN B12: VITAMIN B 12: 440 pg/mL (ref 211–911)

## 2016-10-31 LAB — FOLATE: Folate: 15.1 ng/mL (ref >5.9)

## 2016-10-31 NOTE — Telephone Encounter (Signed)
° ° ° °  Pt did not want to cancel her lab appt. She was advise that if she received a bill for labs not covered that she will be responsible for payment and pt understood .

## 2016-11-02 LAB — CARBAMAZEPINE, FREE AND TOTAL
CARBAMAZEPINE METABOLITE -: 1.9 ug/mL (ref 0.2–2.0)
CARBAMAZEPINE METABOLITE/: 0.9 ug/mL
CARBAMAZEPINE, TOTAL: 8.5 ug/mL (ref 4.0–12.0)
Carbamazepine Metabolite: 1 ug/mL (ref 0.1–1.0)
Carbamazepine, Bound: 6.7 ug/mL
Carbamazepine, Free: 1.8 ug/mL (ref 1.0–3.0)

## 2016-11-04 NOTE — Progress Notes (Signed)
Chief Complaint  Patient presents with  . Annual Exam    HPI: [name]  51 y.o. comes in today for Preventive Health Care visit  She has a form for health insurance to fill out. She's under care by psychiatry specialist for depression on medicines that are being monitored. She did have a rapid weight gain with one of the medications carbamazepine and is working.  Constipation  has been problematic and is being given Lunesta didn't help among other things she occasionally takes FiberCon. Most recently she was treated at an urgent care many clinic or pneumonia when she presented with progressive respiratory symptoms and fever. An x-ray diagnosed her with pneumonia and she was put on doxycycline for 10 days. Her fever is gone cough is still present somewhat but no shortness of breath. Her psoriasis is mostly in check she had chosen to not repeat the psoriatic arthritis but had been evaluated by rheumatology in the past. Hasn't really been checking her blood pressure but it has been off at times and there is a family history of hypertension in her mom.   Health Maintenance  Topic Date Due  . INFLUENZA VACCINE  03/11/2016  . Hepatitis C Screening  11/05/2017 (Originally 1966-04-30)  . HIV Screening  11/05/2017 (Originally 06/11/1981)  . MAMMOGRAM  07/11/2018  . PAP SMEAR  07/31/2019  . TETANUS/TDAP  09/08/2021  . COLONOSCOPY  09/27/2024   Health Maintenance Review LIFESTYLE:  Exercise:   wegiht training  2 x and walking  limited weather .  Tobacco/ETS: no Alcohol:  On weekends  Sugar beverages: no Sleep: 6 hours  Drug use: no HH of  3 + 3 at college  dog Work: pt church 15 - 20 per week    ROS:  Constipation  ? From meds    Tried acupuncture  linsesse .  GEN/ HEENT: No fever, significant weight changes sweats headaches vision problems hearing changes, CV/ PULM; No chest pain shortness of breath cough, syncope,edema  change in exercise tolerance. GI /GU: No  adominal pain, vomiting, change in bowel habits. No blood in the stool. No significant GU symptoms. SKIN/HEME: ,no acute skin rashes suspicious lesions or bleeding. No lymphadenopathy, nodules, masses.  NEURO/ PSYCH:  No neurologic signs such as weakness numbness. No depression anxiety. IMM/ Allergy: No unusual infections.  Allergy .   REST of 12 system review negative except as per HPI   Past Medical History:  Diagnosis Date  . ADD (attention deficit disorder)    poss  . Depression   . Psoriasis   . Psoriatic arthritis (HCC) 10/2012    Past Surgical History:  Procedure Laterality Date  . BUNIONECTOMY     bone spur left foot  . ENDOMETRIAL ABLATION      Family History  Problem Relation Age of Onset  . Anxiety disorder    . Diabetes Father   . Glaucoma Father   . Myasthenia gravis Mother   . Other Mother     PA  . Other Brother     devic disease NMO neuromyelitis optica  . Hashimoto's thyroiditis Daughter   . Psoriasis Sister   . Depression Sister   . Arthritis      parents     Social History   Social History  . Marital status: Married    Spouse name: N/A  . Number of children: N/A  . Years of education: N/A   Social History Main Topics  . Smoking status: Never Smoker  .  Smokeless tobacco: Never Used  . Alcohol use Yes     Comment: Socially   . Drug use: No  . Sexual activity: Not Asked   Other Topics Concern  . None   Social History Narrative    Married   No caffeine   Managing HH   Of 4    Pet dog   Regular exercise- yes  Not much in winter    BA degree          Outpatient Medications Prior to Visit  Medication Sig Dispense Refill  . EQUETRO 200 MG CP12 12 hr capsule Take 200 mg by mouth daily.     [name] lamoTRIgine (LAMICTAL) 100 MG tablet Take 200 mg by mouth daily.    . traZODone (DESYREL) 100 MG tablet Take 100 mg by mouth at bedtime.    [name] zolpidem (AMBIEN) 5 MG tablet Take 1 tablet (5 mg total) by mouth at bedtime as needed for sleep. 15  tablet 0  . thiamine (VITAMIN B-1) 100 MG tablet Take 100 mg by mouth daily.  5  . Vitamin D, Ergocalciferol, (DRISDOL) 50000 units CAPS capsule Take 1 capsule (50,000 Units total) by mouth every 7 (seven) days. (Patient not taking: Reported on 11/05/2016) 12 capsule 0  . folic acid (FOLVITE) 1 MG tablet Take 1 tablet by mouth daily.    [name] gabapentin (NEURONTIN) 100 MG capsule Take 2 capsules (200 mg total) by mouth at bedtime. 60 capsule 3  . naltrexone (DEPADE) 50 MG tablet Take 50 mg by mouth daily.      No facility-administered medications prior to visit.      EXAM:  BP (!) 142/90   Pulse 86   Temp 98.4 F (36.9 C) (Oral)   Ht 5\' 6"  (1.676 m)   Wt 176 lb 12.8 oz (80.2 kg)   BMI 28.54 kg/m   Body mass index is 28.54 kg/m. Wt Readings from Last 3 Encounters:  11/05/16 176 lb 12.8 oz (80.2 kg)  06/26/16 179 lb (81.2 kg)  03/24/16 177 lb (80.3 kg)    Physical Exam: Vital signs reviewed [id number] is a well-developed well-nourished alert cooperative    who appearsr stated age in no acute distress.  HEENT: normocephalic atraumatic , Eyes: PERRL EOM's full, conjunctiva clear, Nares: paten,t no deformity discharge or tenderness., Ears: no deformity EAC's clear TMs with normal landmarks. Mouth: clear OP, no lesions, edema.  Moist mucous membranes. Dentition in adequate repair. NECK: supple without masses, thyromegaly or bruits. CHEST/PULM:  Clear to auscultation and percussion breath sounds equal no wheeze , rales or rhonchi. No chest wall deformities or tenderness. Breast: normal by inspection . No dimpling, discharge, masses, tenderness or discharge . CV: PMI is nondisplaced, S1 S2 no gallops, murmurs, rubs. Peripheral pulses are full without delay.No JVD .  ABDOMEN: Bowel sounds normal nontender  No guard or rebound, no hepato splenomegal no CVA tenderness.  No hernia. Extremtities:  No clubbing cyanosis or edema, no acute joint swelling or redness no focal atrophy NEURO:  Oriented  x3, cranial nerves 3-12 appear to be intact, no obvious focal weakness,gait within normal limits no abnormal reflexes or asymmetrical SKIN: No acute rashes normal turgor, color, no bruising or petechiae. PSYCH: Oriented, good eye contact, no obvious depression anxiety, cognition and judgment appear normal. LN: no cervical axillary inguinal adenopathy  Lab Results  Component Value Date   WBC 4.0 10/31/2016   HGB 12.8 10/31/2016   HCT 36.9 10/31/2016   PLT 245.0 10/31/2016  GLUCOSE 100 (H) 10/31/2016   CHOL 225 (H) 10/31/2016   TRIG 52.0 10/31/2016   HDL 114.80 10/31/2016   LDLDIRECT 51.0 09/16/2012   LDLCALC 99 10/31/2016   ALT 9 10/31/2016   AST 12 10/31/2016   NA 134 (L) 10/31/2016   K 4.1 10/31/2016   CL 101 10/31/2016   CREATININE 0.65 10/31/2016   BUN 8 10/31/2016   CO2 25 10/31/2016   TSH 0.36 10/31/2016    BP Readings from Last 3 Encounters:  11/05/16 (!) 142/90  06/26/16 136/84  03/24/16 130/84    Lab results reviewed with patient   ASSESSMENT AND PLAN:  Discussed the following assessment and plan:  Visit for preventive health examination - reported utd on hcm or scheduled  Hyperlipidemia, unspecified hyperlipidemia type  Elevated BP without diagnosis of hypertension  Constipation, unspecified constipation type  Pneumonia due to infectious organism, unspecified laterality, unspecified part of lung - under rx convalescent Form completed return to patient. Blood pressure still elevated someone on second-degree she will get blood pressure readings and bring her monitor in for a follow-up visit and implement lifestyle interventions. Dash eating etc. Discussed about medicine intervention when appropriate. At this time no need for follow-up for the pneumonia however she her fever relapses or other symptoms contact us for follow-up evaluation. Try miralax for her constipation  Patient Care Team: Madelin HeadingsWanda K Panosh, MD as PCP - General Cherly Hensenhristopher B Aiken, MD  (Psychiatry) Campbell StallHope Gruber, MD as Attending Physician (Dermatology) Levi AlandMark E Anderson, MD as Attending Physician (Obstetrics and Gynecology) Patient Instructions   Blood tests  normal   But elevated lipids  But favorable ratio  No anemia or evidence of vitamin deficiency  Take blood pressure readings twice a day for 10 -14 days and then periodically .To ensure below 140/90   .prefer 130/80 or below    Make ROV in 1 month with monitor   And readings .   Dash eating can help bp .    Try MiraLAX 1-2 capfuls a day to see if it eases the constipation some.   DASH Eating Plan DASH stands for "Dietary Approaches to Stop Hypertension." The DASH eating plan is a healthy eating plan that has been shown to reduce high blood pressure (hypertension). It may also reduce your risk for type 2 diabetes, heart disease, and stroke. The DASH eating plan may also help with weight loss. What are tips for following this plan? General guidelines   Avoid eating more than 2,300 mg (milligrams) of salt (sodium) a day. If you have hypertension, you may need to reduce your sodium intake to 1,500 mg a day.  Limit alcohol intake to no more than 1 drink a day for nonpregnant women and 2 drinks a day for men. One drink equals 12 oz of beer, 5 oz of wine, or 1 oz of hard liquor.  Work with your health care provider to maintain a healthy body weight or to lose weight. Ask what an ideal weight is for you.  Get at least 30 minutes of exercise that causes your heart to beat faster (aerobic exercise) most days of the week. Activities may include walking, swimming, or biking.  Work with your health care provider or diet and nutrition specialist (dietitian) to adjust your eating plan to your individual calorie needs. Reading food labels   Check food labels for the amount of sodium per serving. Choose foods with less than 5 percent of the Daily Value of sodium. Generally, foods with less than 300  mg of sodium per serving fit  into this eating plan.  To find whole grains, look for the word "whole" as the first word in the ingredient list. Shopping   Buy products labeled as "low-sodium" or "no salt added."  Buy fresh foods. Avoid canned foods and premade or frozen meals. Cooking   Avoid adding salt when cooking. Use salt-free seasonings or herbs instead of table salt or sea salt. Check with your health care provider or pharmacist before using salt substitutes.  Do not fry foods. Cook foods using healthy methods such as baking, boiling, grilling, and broiling instead.  Cook with heart-healthy oils, such as olive, canola, soybean, or sunflower oil. Meal planning    Eat a balanced diet that includes:  5 or more servings of fruits and vegetables each day. At each meal, try to fill half of your plate with fruits and vegetables.  Up to 6-8 servings of whole grains each day.  Less than 6 oz of lean meat, poultry, or fish each day. A 3-oz serving of meat is about the same size as a deck of cards. One egg equals 1 oz.  2 servings of low-fat dairy each day.  A serving of nuts, seeds, or beans 5 times each week.  Heart-healthy fats. Healthy fats called Omega-3 fatty acids are found in foods such as flaxseeds and coldwater fish, like sardines, salmon, and mackerel.  Limit how much you eat of the following:  Canned or prepackaged foods.  Food that is high in trans fat, such as fried foods.  Food that is high in saturated fat, such as fatty meat.  Sweets, desserts, sugary drinks, and other foods with added sugar.  Full-fat dairy products.  Do not salt foods before eating.  Try to eat at least 2 vegetarian meals each week.  Eat more home-cooked food and less restaurant, buffet, and fast food.  When eating at a restaurant, ask that your food be prepared with less salt or no salt, if possible. What foods are recommended? The items listed may not be a complete list. Talk with your dietitian about what  dietary choices are best for you. Grains  Whole-grain or whole-wheat bread. Whole-grain or whole-wheat pasta. Brown rice. Orpah Cobb. Bulgur. Whole-grain and low-sodium cereals. Pita bread. Low-fat, low-sodium crackers. Whole-wheat flour tortillas. Vegetables  Fresh or frozen vegetables (raw, steamed, roasted, or grilled). Low-sodium or reduced-sodium tomato and vegetable juice. Low-sodium or reduced-sodium tomato sauce and tomato paste. Low-sodium or reduced-sodium canned vegetables. Fruits  All fresh, dried, or frozen fruit. Canned fruit in natural juice (without added sugar). Meat and other protein foods  Skinless chicken or [place]. Ground chicken or [place]. Pork with fat trimmed off. Fish and seafood. Egg whites. Dried beans, peas, or lentils. Unsalted nuts, nut butters, and seeds. Unsalted canned beans. Lean cuts of beef with fat trimmed off. Low-sodium, lean deli meat. Dairy  Low-fat (1%) or fat-free (skim) milk. Fat-free, low-fat, or reduced-fat cheeses. Nonfat, low-sodium ricotta or cottage cheese. Low-fat or nonfat yogurt. Low-fat, low-sodium cheese. Fats and oils  Soft margarine without trans fats. Vegetable oil. Low-fat, reduced-fat, or light mayonnaise and salad dressings (reduced-sodium). Canola, safflower, olive, soybean, and sunflower oils. Avocado. Seasoning and other foods  Herbs. Spices. Seasoning mixes without salt. Unsalted popcorn and pretzels. Fat-free sweets. What foods are not recommended? The items listed may not be a complete list. Talk with your dietitian about what dietary choices are best for you. Grains  Baked goods made with fat, such as croissants, muffins,  or some breads. Dry pasta or rice meal packs. Vegetables  Creamed or fried vegetables. Vegetables in a cheese sauce. Regular canned vegetables (not low-sodium or reduced-sodium). Regular canned tomato sauce and paste (not low-sodium or reduced-sodium). Regular tomato and vegetable juice (not low-sodium or  reduced-sodium). Rosita Fire. Olives. Fruits  Canned fruit in a light or heavy syrup. Fried fruit. Fruit in cream or butter sauce. Meat and other protein foods  Fatty cuts of meat. Ribs. Fried meat. Tomasa Blase. Sausage. Bologna and other processed lunch meats. Salami. Fatback. Hotdogs. Bratwurst. Salted nuts and seeds. Canned beans with added salt. Canned or smoked fish. Whole eggs or egg yolks. Chicken or [place] with skin. Dairy  Whole or 2% milk, cream, and half-and-half. Whole or full-fat cream cheese. Whole-fat or sweetened yogurt. Full-fat cheese. Nondairy creamers. Whipped toppings. Processed cheese and cheese spreads. Fats and oils  Butter. Stick margarine. Lard. Shortening. Ghee. Bacon fat. Tropical oils, such as coconut, palm kernel, or palm oil. Seasoning and other foods  Salted popcorn and pretzels. Onion salt, garlic salt, seasoned salt, table salt, and sea salt. Worcestershire sauce. Tartar sauce. Barbecue sauce. Teriyaki sauce. Soy sauce, including reduced-sodium. Steak sauce. Canned and packaged gravies. Fish sauce. Oyster sauce. Cocktail sauce. Horseradish that you find on the shelf. Ketchup. Mustard. Meat flavorings and tenderizers. Bouillon cubes. Hot sauce and Tabasco sauce. Premade or packaged marinades. Premade or packaged taco seasonings. Relishes. Regular salad dressings. Where to find more information:  National Heart, Lung, and Blood Institute: PopSteam.is  American Heart Association: www.heart.org Summary  The DASH eating plan is a healthy eating plan that has been shown to reduce high blood pressure (hypertension). It may also reduce your risk for type 2 diabetes, heart disease, and stroke.  With the DASH eating plan, you should limit salt (sodium) intake to 2,300 mg a day. If you have hypertension, you may need to reduce your sodium intake to 1,500 mg a day.  When on the DASH eating plan, aim to eat more fresh fruits and vegetables, whole grains, lean proteins, low-fat  dairy, and heart-healthy fats.  Work with your health care provider or diet and nutrition specialist (dietitian) to adjust your eating plan to your individual calorie needs. This information is not intended to replace advice given to you by your health care provider. Make sure you discuss any questions you have with your health care provider. Document Released: 07/17/2011 Document Revised: 07/21/2016 Document Reviewed: 07/21/2016 Elsevier Interactive Patient Education  2017 [employer].      [place]. Panosh M.D.

## 2016-11-05 ENCOUNTER — Encounter: Payer: Self-pay | Admitting: Internal Medicine

## 2016-11-05 ENCOUNTER — Ambulatory Visit (INDEPENDENT_AMBULATORY_CARE_PROVIDER_SITE_OTHER): Payer: BLUE CROSS/BLUE SHIELD | Admitting: Internal Medicine

## 2016-11-05 VITALS — BP 142/90 | HR 86 | Temp 98.4°F | Ht 66.0 in | Wt 176.8 lb

## 2016-11-05 DIAGNOSIS — Z0000 Encounter for general adult medical examination without abnormal findings: Secondary | ICD-10-CM | POA: Diagnosis not present

## 2016-11-05 DIAGNOSIS — J189 Pneumonia, unspecified organism: Secondary | ICD-10-CM | POA: Diagnosis not present

## 2016-11-05 DIAGNOSIS — E785 Hyperlipidemia, unspecified: Secondary | ICD-10-CM | POA: Diagnosis not present

## 2016-11-05 DIAGNOSIS — K5900 Constipation, unspecified: Secondary | ICD-10-CM | POA: Diagnosis not present

## 2016-11-05 DIAGNOSIS — R030 Elevated blood-pressure reading, without diagnosis of hypertension: Secondary | ICD-10-CM | POA: Diagnosis not present

## 2016-11-05 NOTE — Patient Instructions (Addendum)
Blood tests  normal   But elevated lipids  But favorable ratio  No anemia or evidence of vitamin deficiency  Take blood pressure readings twice a day for 10 -14 days and then periodically .To ensure below 140/90   .prefer 130/80 or below    Make ROV in 1 month with monitor   And readings .   Dash eating can help bp .    Try MiraLAX 1-2 capfuls a day to see if it eases the constipation some.   DASH Eating Plan DASH stands for "Dietary Approaches to Stop Hypertension." The DASH eating plan is a healthy eating plan that has been shown to reduce high blood pressure (hypertension). It may also reduce your risk for type 2 diabetes, heart disease, and stroke. The DASH eating plan may also help with weight loss. What are tips for following this plan? General guidelines   Avoid eating more than 2,300 mg (milligrams) of salt (sodium) a day. If you have hypertension, you may need to reduce your sodium intake to 1,500 mg a day.  Limit alcohol intake to no more than 1 drink a day for nonpregnant women and 2 drinks a day for men. One drink equals 12 oz of beer, 5 oz of wine, or 1 oz of hard liquor.  Work with your health care provider to maintain a healthy body weight or to lose weight. Ask what an ideal weight is for you.  Get at least 30 minutes of exercise that causes your heart to beat faster (aerobic exercise) most days of the week. Activities may include walking, swimming, or biking.  Work with your health care provider or diet and nutrition specialist (dietitian) to adjust your eating plan to your individual calorie needs. Reading food labels   Check food labels for the amount of sodium per serving. Choose foods with less than 5 percent of the Daily Value of sodium. Generally, foods with less than 300 mg of sodium per serving fit into this eating plan.  To find whole grains, look for the word "whole" as the first word in the ingredient list. Shopping   Buy products labeled as  "low-sodium" or "no salt added."  Buy fresh foods. Avoid canned foods and premade or frozen meals. Cooking   Avoid adding salt when cooking. Use salt-free seasonings or herbs instead of table salt or sea salt. Check with your health care provider or pharmacist before using salt substitutes.  Do not fry foods. Cook foods using healthy methods such as baking, boiling, grilling, and broiling instead.  Cook with heart-healthy oils, such as olive, canola, soybean, or sunflower oil. Meal planning    Eat a balanced diet that includes:  5 or more servings of fruits and vegetables each day. At each meal, try to fill half of your plate with fruits and vegetables.  Up to 6-8 servings of whole grains each day.  Less than 6 oz of lean meat, poultry, or fish each day. A 3-oz serving of meat is about the same size as a deck of cards. One egg equals 1 oz.  2 servings of low-fat dairy each day.  A serving of nuts, seeds, or beans 5 times each week.  Heart-healthy fats. Healthy fats called Omega-3 fatty acids are found in foods such as flaxseeds and coldwater fish, like sardines, salmon, and mackerel.  Limit how much you eat of the following:  Canned or prepackaged foods.  Food that is high in trans fat, such as fried foods.  Food that  is high in saturated fat, such as fatty meat.  Sweets, desserts, sugary drinks, and other foods with added sugar.  Full-fat dairy products.  Do not salt foods before eating.  Try to eat at least 2 vegetarian meals each week.  Eat more home-cooked food and less restaurant, buffet, and fast food.  When eating at a restaurant, ask that your food be prepared with less salt or no salt, if possible. What foods are recommended? The items listed may not be a complete list. Talk with your dietitian about what dietary choices are best for you. Grains  Whole-grain or whole-wheat bread. Whole-grain or whole-wheat pasta. Brown rice. Orpah Cobb. Bulgur.  Whole-grain and low-sodium cereals. Pita bread. Low-fat, low-sodium crackers. Whole-wheat flour tortillas. Vegetables  Fresh or frozen vegetables (raw, steamed, roasted, or grilled). Low-sodium or reduced-sodium tomato and vegetable juice. Low-sodium or reduced-sodium tomato sauce and tomato paste. Low-sodium or reduced-sodium canned vegetables. Fruits  All fresh, dried, or frozen fruit. Canned fruit in natural juice (without added sugar). Meat and other protein foods  Skinless chicken or [place]. Ground chicken or [place]. Pork with fat trimmed off. Fish and seafood. Egg whites. Dried beans, peas, or lentils. Unsalted nuts, nut butters, and seeds. Unsalted canned beans. Lean cuts of beef with fat trimmed off. Low-sodium, lean deli meat. Dairy  Low-fat (1%) or fat-free (skim) milk. Fat-free, low-fat, or reduced-fat cheeses. Nonfat, low-sodium ricotta or cottage cheese. Low-fat or nonfat yogurt. Low-fat, low-sodium cheese. Fats and oils  Soft margarine without trans fats. Vegetable oil. Low-fat, reduced-fat, or light mayonnaise and salad dressings (reduced-sodium). Canola, safflower, olive, soybean, and sunflower oils. Avocado. Seasoning and other foods  Herbs. Spices. Seasoning mixes without salt. Unsalted popcorn and pretzels. Fat-free sweets. What foods are not recommended? The items listed may not be a complete list. Talk with your dietitian about what dietary choices are best for you. Grains  Baked goods made with fat, such as croissants, muffins, or some breads. Dry pasta or rice meal packs. Vegetables  Creamed or fried vegetables. Vegetables in a cheese sauce. Regular canned vegetables (not low-sodium or reduced-sodium). Regular canned tomato sauce and paste (not low-sodium or reduced-sodium). Regular tomato and vegetable juice (not low-sodium or reduced-sodium). Rosita Fire. Olives. Fruits  Canned fruit in a light or heavy syrup. Fried fruit. Fruit in cream or butter sauce. Meat and other  protein foods  Fatty cuts of meat. Ribs. Fried meat. Tomasa Blase. Sausage. Bologna and other processed lunch meats. Salami. Fatback. Hotdogs. Bratwurst. Salted nuts and seeds. Canned beans with added salt. Canned or smoked fish. Whole eggs or egg yolks. Chicken or [place] with skin. Dairy  Whole or 2% milk, cream, and half-and-half. Whole or full-fat cream cheese. Whole-fat or sweetened yogurt. Full-fat cheese. Nondairy creamers. Whipped toppings. Processed cheese and cheese spreads. Fats and oils  Butter. Stick margarine. Lard. Shortening. Ghee. Bacon fat. Tropical oils, such as coconut, palm kernel, or palm oil. Seasoning and other foods  Salted popcorn and pretzels. Onion salt, garlic salt, seasoned salt, table salt, and sea salt. Worcestershire sauce. Tartar sauce. Barbecue sauce. Teriyaki sauce. Soy sauce, including reduced-sodium. Steak sauce. Canned and packaged gravies. Fish sauce. Oyster sauce. Cocktail sauce. Horseradish that you find on the shelf. Ketchup. Mustard. Meat flavorings and tenderizers. Bouillon cubes. Hot sauce and Tabasco sauce. Premade or packaged marinades. Premade or packaged taco seasonings. Relishes. Regular salad dressings. Where to find more information:  National Heart, Lung, and Blood Institute: PopSteam.is  American Heart Association: www.heart.org Summary  The DASH eating plan is  a healthy eating plan that has been shown to reduce high blood pressure (hypertension). It may also reduce your risk for type 2 diabetes, heart disease, and stroke.  With the DASH eating plan, you should limit salt (sodium) intake to 2,300 mg a day. If you have hypertension, you may need to reduce your sodium intake to 1,500 mg a day.  When on the DASH eating plan, aim to eat more fresh fruits and vegetables, whole grains, lean proteins, low-fat dairy, and heart-healthy fats.  Work with your health care provider or diet and nutrition specialist (dietitian) to adjust your eating plan to  your individual calorie needs. This information is not intended to replace advice given to you by your health care provider. Make sure you discuss any questions you have with your health care provider. Document Released: 07/17/2011 Document Revised: 07/21/2016 Document Reviewed: 07/21/2016 Elsevier Interactive Patient Education  2017 [employer]

## 2016-12-19 ENCOUNTER — Ambulatory Visit: Payer: BLUE CROSS/BLUE SHIELD | Admitting: Internal Medicine

## 2017-01-09 ENCOUNTER — Ambulatory Visit: Payer: BLUE CROSS/BLUE SHIELD | Admitting: Internal Medicine

## 2017-03-24 ENCOUNTER — Encounter: Payer: Self-pay | Admitting: Internal Medicine

## 2017-03-24 ENCOUNTER — Ambulatory Visit (INDEPENDENT_AMBULATORY_CARE_PROVIDER_SITE_OTHER): Payer: BLUE CROSS/BLUE SHIELD | Admitting: Internal Medicine

## 2017-03-24 VITALS — BP 138/88 | HR 87 | Temp 98.2°F | Ht 66.0 in | Wt 179.0 lb

## 2017-03-24 DIAGNOSIS — J020 Streptococcal pharyngitis: Secondary | ICD-10-CM | POA: Diagnosis not present

## 2017-03-24 MED ORDER — PENICILLIN V POTASSIUM 500 MG PO TABS: 500.0000 mg | ORAL_TABLET | Freq: Three times a day (TID) | ORAL | 0 refills | Status: DC

## 2017-03-24 NOTE — Progress Notes (Signed)
Subjective:    Patient ID: Melissa Walters, female    DOB: 05-31-1966, 51 y.o.   MRN: [id number]  HPI  51 year old patient who presents with a chief complaint of severe sore throat for the past 3 days.  She has 4 kids who have been well but has been exposed to a nephew with documented streptococcal pharyngitis. Denies any URI symptoms.  She states that she has felt feverish with mild headache but no documented fever.  She states that she has had GAS pharyngitis in the past and these symptoms are identical.  She states that she has a significant gag reflex and does not feel we can adequately obtain a specimen.  Past Medical History:  Diagnosis Date  . ADD (attention deficit disorder)    poss  . Depression   . Psoriasis   . Psoriatic arthritis (HCC) 10/2012     Social History   Social History  . Marital status: Married    Spouse name: N/A  . Number of children: N/A  . Years of education: N/A   Occupational History  . Not on file.   Social History Main Topics  . Smoking status: Never Smoker  . Smokeless tobacco: Never Used  . Alcohol use Yes     Comment: Socially   . Drug use: No  . Sexual activity: Not on file   Other Topics Concern  . Not on file   Social History Narrative    Married   No caffeine   Managing HH   Of 4    Pet dog   Regular exercise- yes  Not much in winter    BA degree          Past Surgical History:  Procedure Laterality Date  . BUNIONECTOMY     bone spur left foot  . ENDOMETRIAL ABLATION      Family History  Problem Relation Age of Onset  . Anxiety disorder Unknown   . Diabetes Father   . Glaucoma Father   . Myasthenia gravis Mother   . Other Mother        [place]  . Other Brother        devic disease NMO neuromyelitis optica  . Hashimoto's thyroiditis Daughter   . Psoriasis Sister   . Depression Sister   . Arthritis Unknown        parents     Allergies  Allergen Reactions  . Compazine Swelling    Throat swelling     Current Outpatient Prescriptions on File Prior to Visit  Medication Sig Dispense Refill  . EQUETRO 200 MG CP12 12 hr capsule Take 200 mg by mouth daily.     [name]. lamoTRIgine (LAMICTAL) 100 MG tablet Take 200 mg by mouth daily.    [name]. thiamine (VITAMIN B-1) 100 MG tablet Take 100 mg by mouth daily.  5  . traZODone (DESYREL) 100 MG tablet Take 100 mg by mouth at bedtime.    . Vitamin D, Ergocalciferol, (DRISDOL) 50000 units CAPS capsule Take 1 capsule (50,000 Units total) by mouth every 7 (seven) days. 12 capsule 0  . zolpidem (AMBIEN) 5 MG tablet Take 1 tablet (5 mg total) by mouth at bedtime as needed for sleep. 15 tablet 0   No current facility-administered medications on file prior to visit.     BP 138/88 (BP Location: Left Arm, Patient Position: Sitting, Cuff Size: Normal)   Pulse 87   Temp 98.2 F (36.8 C) (Oral)   Ht 5\' 6"  (1.676 m)  Wt 179 lb (81.2 kg)   SpO2 97%   BMI 28.89 kg/m     Review of Systems  Constitutional: Positive for activity change, appetite change and fatigue.  HENT: Positive for sore throat. Negative for congestion, dental problem, hearing loss, rhinorrhea, sinus pressure and tinnitus.   Eyes: Negative for pain, discharge and visual disturbance.  Respiratory: Negative for cough and shortness of breath.   Cardiovascular: Negative for chest pain, palpitations and leg swelling.  Gastrointestinal: Negative for abdominal distention, abdominal pain, blood in stool, constipation, diarrhea, nausea and vomiting.  Genitourinary: Negative for difficulty urinating, dysuria, flank pain, frequency, hematuria, pelvic pain, urgency, vaginal bleeding, vaginal discharge and vaginal pain.  Musculoskeletal: Negative for arthralgias, gait problem and joint swelling.  Skin: Negative for rash.  Neurological: Negative for dizziness, syncope, speech difficulty, weakness, numbness and headaches.  Hematological: Negative for adenopathy.  Psychiatric/Behavioral: Negative for  agitation, behavioral problems and dysphoric mood. The patient is not nervous/anxious.        Objective:   Physical Exam  Constitutional: She is oriented to person, place, and time. She appears well-developed and well-nourished. No distress.  Afebrile Appears unwell, but in no acute distress  HENT:  Head: Normocephalic.  Right Ear: External ear normal.  Left Ear: External ear normal.  Oral pharynx was erythematous without exudate  Eyes: Pupils are equal, round, and reactive to light. Conjunctivae and EOM are normal.  Neck: Normal range of motion. Neck supple. No thyromegaly present.  Tenderness in the high submandibular area, but no discrete adenopathy  Cardiovascular: Normal rate, regular rhythm, normal heart sounds and intact distal pulses.   Pulmonary/Chest: Effort normal and breath sounds normal. She has no wheezes.  Abdominal: Soft. Bowel sounds are normal. She exhibits no mass. There is no tenderness.  Musculoskeletal: Normal range of motion.  Lymphadenopathy:    She has no cervical adenopathy.  Neurological: She is alert and oriented to person, place, and time.  Skin: Skin is warm and dry. No rash noted.  Psychiatric: She has a normal mood and affect. Her behavior is normal.          Assessment & Plan:   Pharyngitis/strep exposure/history of GAS pharyngitis with similar symptoms.   Treat with pen VK 500 3 times a day for 10 days Symptomatically treat with Advil and lozenges  KWIATKOWSKI,PETER Homero Fellers

## 2017-03-24 NOTE — Patient Instructions (Addendum)

## 2017-05-01 ENCOUNTER — Encounter: Payer: Self-pay | Admitting: Internal Medicine

## 2017-05-28 DIAGNOSIS — F3181 Bipolar II disorder: Secondary | ICD-10-CM | POA: Diagnosis not present

## 2017-08-17 ENCOUNTER — Emergency Department (HOSPITAL_BASED_OUTPATIENT_CLINIC_OR_DEPARTMENT_OTHER)
Admission: EM | Admit: 2017-08-17 | Discharge: 2017-08-18 | Disposition: A | Discharge status: Home / Self Care | Payer: BLUE CROSS/BLUE SHIELD | Attending: Emergency Medicine | Admitting: Emergency Medicine

## 2017-08-17 ENCOUNTER — Other Ambulatory Visit: Payer: Self-pay

## 2017-08-17 ENCOUNTER — Encounter: Payer: Self-pay | Admitting: Family Medicine

## 2017-08-17 ENCOUNTER — Emergency Department (HOSPITAL_BASED_OUTPATIENT_CLINIC_OR_DEPARTMENT_OTHER): Payer: BLUE CROSS/BLUE SHIELD

## 2017-08-17 ENCOUNTER — Encounter (HOSPITAL_BASED_OUTPATIENT_CLINIC_OR_DEPARTMENT_OTHER): Payer: Self-pay

## 2017-08-17 ENCOUNTER — Ambulatory Visit (INDEPENDENT_AMBULATORY_CARE_PROVIDER_SITE_OTHER): Payer: BLUE CROSS/BLUE SHIELD | Admitting: Family Medicine

## 2017-08-17 ENCOUNTER — Ambulatory Visit (INDEPENDENT_AMBULATORY_CARE_PROVIDER_SITE_OTHER): Payer: BLUE CROSS/BLUE SHIELD

## 2017-08-17 ENCOUNTER — Ambulatory Visit: Payer: Self-pay | Admitting: *Deleted

## 2017-08-17 VITALS — BP 144/86 | HR 74 | Temp 98.0°F | Ht 66.0 in | Wt 179.0 lb

## 2017-08-17 DIAGNOSIS — Z79899 Other long term (current) drug therapy: Secondary | ICD-10-CM | POA: Insufficient documentation

## 2017-08-17 DIAGNOSIS — R0602 Shortness of breath: Secondary | ICD-10-CM | POA: Diagnosis not present

## 2017-08-17 DIAGNOSIS — J189 Pneumonia, unspecified organism: Secondary | ICD-10-CM | POA: Diagnosis not present

## 2017-08-17 DIAGNOSIS — R079 Chest pain, unspecified: Secondary | ICD-10-CM

## 2017-08-17 DIAGNOSIS — E041 Nontoxic single thyroid nodule: Secondary | ICD-10-CM | POA: Diagnosis not present

## 2017-08-17 DIAGNOSIS — R110 Nausea: Secondary | ICD-10-CM | POA: Diagnosis not present

## 2017-08-17 HISTORY — DX: Unspecified mood (affective) disorder: F39

## 2017-08-17 LAB — HEPATIC FUNCTION PANEL
ALK PHOS: 76 U/L (ref 38–126)
ALT: 15 U/L (ref 14–54)
AST: 23 U/L (ref 15–41)
Albumin: 4.3 g/dL (ref 3.5–5.0)
BILIRUBIN DIRECT: 0.1 mg/dL (ref 0.1–0.5)
BILIRUBIN INDIRECT: 0.1 mg/dL — AB (ref 0.3–0.9)
BILIRUBIN TOTAL: 0.2 mg/dL — AB (ref 0.3–1.2)
Total Protein: 7.2 g/dL (ref 6.5–8.1)

## 2017-08-17 LAB — CBC
HEMATOCRIT: 33.7 % — AB (ref 36.0–46.0)
HEMOGLOBIN: 11.3 g/dL — AB (ref 12.0–15.0)
MCH: 28.8 pg (ref 26.0–34.0)
MCHC: 33.5 g/dL (ref 30.0–36.0)
MCV: 86 fL (ref 78.0–100.0)
Platelets: 202 10*3/uL (ref 150–400)
RBC: 3.92 MIL/uL (ref 3.87–5.11)
RDW: 14 % (ref 11.5–15.5)
WBC: 7.9 10*3/uL (ref 4.0–10.5)

## 2017-08-17 LAB — BASIC METABOLIC PANEL
ANION GAP: 9 (ref 5–15)
BUN: 12 mg/dL (ref 6–20)
CO2: 24 mmol/L (ref 22–32)
Calcium: 9.1 mg/dL (ref 8.9–10.3)
Chloride: 99 mmol/L — ABNORMAL LOW (ref 101–111)
Creatinine, Ser: 0.76 mg/dL (ref 0.44–1.00)
GFR calc Af Amer: >60 mL/min (ref >60)
Glucose, Bld: 106 mg/dL — ABNORMAL HIGH (ref 65–99)
POTASSIUM: 3.8 mmol/L (ref 3.5–5.1)
Sodium: 132 mmol/L — ABNORMAL LOW (ref 135–145)

## 2017-08-17 LAB — PREGNANCY, URINE: Preg Test, Ur: NEGATIVE

## 2017-08-17 LAB — TROPONIN I
Troponin I: <0.03 ng/mL (ref <0.03)
Troponin I: <0.03 ng/mL (ref <0.03)

## 2017-08-17 LAB — D-DIMER, QUANTITATIVE: D-Dimer, Quant: 1.29 ug/mL-FEU — ABNORMAL HIGH (ref 0.00–0.50)

## 2017-08-17 MED ORDER — IOPAMIDOL (ISOVUE-370) INJECTION 76%
100.0000 mL | Freq: Once | INTRAVENOUS | Status: AC | PRN
  Administered 2017-08-17: 100 mL via INTRAVENOUS

## 2017-08-17 MED ORDER — AZITHROMYCIN 250 MG PO TABS: 250.0000 mg | ORAL_TABLET | Freq: Every day | ORAL | 0 refills | Status: DC

## 2017-08-17 MED ORDER — ACETAMINOPHEN 325 MG PO TABS
650.0000 mg | ORAL_TABLET | Freq: Once | ORAL | Status: AC
  Administered 2017-08-18: 650 mg via ORAL
  Filled 2017-08-17: qty 2

## 2017-08-17 NOTE — Progress Notes (Signed)
Melissa Walters - 52 y.o. female MRN [id number]  Date of birth: 1965-10-11  SUBJECTIVE:  Including CC & ROS.  Chief Complaint  Patient presents with  . Hypertension    Melissa Walters is a 52 y.o. female that is presenting with an elevated blood pressure and chest tightness. This has been ongoing for one month. Patient states she first noticed the symptoms when she was shoveling her neighbor's driveway during the snow storm. She became sweaty, and had some chest tightness. Admits to jaw pain. She states the holidays were very stressful, recently helping her mom move. Denies any significant family history. She does not have any history of hypertension. She does not feel like herself. She does have a history of reflux but this feels different. Her symptoms are worrisome to her. Her symptoms are usually associated with exercise. Having some nausea. Does not take aspirin on a daily basis. Does not have diabetes. Has a lower TSH.  Review of her lab work from March 2018 shows normal kidney function. Cholesterol is elevated. Complete blood count shows normal hemoglobin and no white count.    Review of Systems  Constitutional: Negative for fever.  HENT: Negative for sore throat.   Respiratory: Negative for cough.   Cardiovascular: Positive for chest pain. Negative for leg swelling.  Musculoskeletal: Negative for back pain.  Skin: Negative for color change.  Allergic/Immunologic: Negative for immunocompromised state.  Neurological: Negative for weakness.  Hematological: Negative for adenopathy.    HISTORY: Past Medical, Surgical, Social, and Family History Reviewed & Updated per EMR.   Pertinent Historical Findings include:  Past Medical History:  Diagnosis Date  . ADD (attention deficit disorder)    poss  . Depression   . Psoriasis   . Psoriatic arthritis (HCC) 10/2012    Past Surgical History:  Procedure Laterality Date  . BUNIONECTOMY     bone spur left foot  . ENDOMETRIAL ABLATION       Allergies  Allergen Reactions  . Compazine Swelling    Throat swelling    Family History  Problem Relation Age of Onset  . Anxiety disorder Unknown   . Diabetes Father   . Glaucoma Father   . Myasthenia gravis Mother   . Other Mother        [place]  . Other Brother        devic disease NMO neuromyelitis optica  . Hashimoto's thyroiditis Daughter   . Psoriasis Sister   . Depression Sister   . Arthritis Unknown        parents      Social History   Socioeconomic History  . Marital status: Married    Spouse name: Not on file  . Number of children: Not on file  . Years of education: Not on file  . Highest education level: Not on file  Social Needs  . Financial resource strain: Not on file  . Food insecurity - worry: Not on file  . Food insecurity - inability: Not on file  . Transportation needs - medical: Not on file  . Transportation needs - non-medical: Not on file  Occupational History  . Not on file  Tobacco Use  . Smoking status: Never Smoker  . Smokeless tobacco: Never Used  Substance and Sexual Activity  . Alcohol use: Yes    Comment: Socially   . Drug use: No  . Sexual activity: Not on file  Other Topics Concern  . Not on file  Social History Narrative    Married  No caffeine   Managing HH   Of 4    Pet dog   Regular exercise- yes  Not much in winter    BA degree        PHYSICAL EXAM:  VS: BP (!) 144/86 (BP Location: Left Arm, Patient Position: Sitting, Cuff Size: Normal)   Pulse 74   Temp 98 F (36.7 C) (Oral)   Ht 5\' 6"  (1.676 m)   Wt 179 lb (81.2 kg)   SpO2 98%   BMI 28.89 kg/m  Physical Exam Gen: NAD, alert, cooperative with exam,  ENT: normal lips, normal nasal mucosa,  Eye: normal EOM, normal conjunctiva and lids CV:  no edema, +2 pedal pulses, S1-S2, regular rate and rhythm  Resp: no accessory muscle use, non-labored, clear to auscultation bilaterally, no crackles or wheezes Skin: no rashes, no areas of induration  Neuro:  normal tone, normal sensation to touch Psych:  normal insight, alert and oriented MSK: normal gait, normal strength, No TTP of the chest wall, no chest pain with shoulder flexion or with resistance    EKG interpretation: normal rate. T inversion in V1, V2. aVr   ASSESSMENT & PLAN:   Chest pain Concerning that her symptoms resemble stable angina. She doesn't have HTN and a fairly normal BMI. No significant family history. Does not feel like herself and feels something is wrong. Has a history of reflux but this feels different. Symptoms are reproduced with exercise. Felt these with shoveling the snow and with moving her mother's furniture.  - EKG and chest xray  - advised to have troponins drawn at the ED and monitoring.

## 2017-08-17 NOTE — ED Triage Notes (Signed)
C/o intermittent CP x 1 month-states pain started after shoveling snow-NAD-steady gait-was sent from PCP

## 2017-08-17 NOTE — Assessment & Plan Note (Signed)
Concerning that her symptoms resemble stable angina. She doesn't have HTN and a fairly normal BMI. No significant family history. Does not feel like herself and feels something is wrong. Has a history of reflux but this feels different. Symptoms are reproduced with exercise. Felt these with shoveling the snow and with moving her mother's furniture.  - EKG and chest xray  - advised to have troponins drawn at the ED and monitoring.

## 2017-08-17 NOTE — Patient Instructions (Addendum)
I would go to the [facility].

## 2017-08-17 NOTE — Telephone Encounter (Signed)
This patient phoned to discuss high blood pressure. She sees Dr. Fabian SharpPanosh at [facility]. She reports she should have been recording her pressures for Dr. Fabian SharpPanosh since the spring but hasn't kept up with it. For the few weeks she has been helping her mother move and has had more physical activity than her usual. She feels her blood pressure may be contributing to lingering headaches and dull chest discomfort. Reason for Disposition . Systolic BP between 120-139 with Diastolic between [id number]  Answer Assessment - Initial Assessment Questions 1. BLOOD PRESSURE: "What is the blood pressure?" "Did you take at least two measurements 5 minutes apart?"    She hasn't taken or logged pressures recently. With the last snow, she felt it was high, took a reading(doen't remember the numbers)and it was very high. 2. ONSET: "When did you take your blood pressure?"     Several weeks ago 3. HOW: "How did you obtain the blood pressure?" (e.g., visiting nurse, automatic home BP monitor)    Home monitor 4. HISTORY: "Do you have a history of high blood pressure?"    Yes at her last physical with Dr. Fabian SharpPanosh, back in the spring of 2017 5. MEDICATIONS: "Are you taking any medications for blood pressure?" "Have you missed any doses recently?"    No she doesn't take blood pressure medication. 6. OTHER SYMPTOMS: "Do you have any symptoms?" (e.g., headache, chest pain, blurred vision, difficulty breathing, weakness)    With recent activity, she has experienced mild chest tightness that always resolved with rest.  7. PREGNANCY: "Is there any chance you are pregnant?" "When was your last menstrual period?" no  Protocols used: HIGH BLOOD PRESSURE-A-AH

## 2017-08-17 NOTE — ED Provider Notes (Signed)
[facility] EMERGENCY DEPARTMENT Provider Note   CSN: [id number] Arrival date & time: 08/17/17  1717     History   Chief Complaint Chief Complaint  Patient presents with  . Chest Pain    HPI Melissa Walters is a 52 y.o. female.  The history is provided by the patient. No language interpreter was used.  Chest Pain      Melissa Walters is a 52 y.o. female who presents to the Emergency Department complaining of chest pain.  He has been experiencing chest pain since shoveling snow a month ago.  During that episode she was shoveling snow and developed abrupt onset central chest pain and right jaw pain for which she had to stop and rest in the house.  Since that day she has experienced intermittent chest pain.  When she gets the episodes is located in the right side of her chest and just below her breast.  She has associated nausea.  No fevers, vomiting, diaphoresis, abdominal pain, leg swelling or pain.  She drinks wine regularly and is a non-smoker and does not use drugs.  She has a history of hyperlipidemia, no known history of hypertension.  There is no family history of cardiac disease.  Past Medical History:  Diagnosis Date  . ADD (attention deficit disorder)    poss  . Depression   . Mood disorder (HCC)   . Psoriasis   . Psoriatic arthritis (HCC) 10/2012    Patient Active Problem List   Diagnosis Date Noted  . Chest pain 08/17/2017  . Abdominal pain, left upper quadrant 06/26/2016  . Acute meniscal tear of right knee 01/08/2016  . Rotator cuff syndrome of right shoulder 07/08/2014  . Elevated serum cholesterol high HDL 12/26/2013  . Tired 12/26/2013  . Toe pain, left 12/26/2013  . Low vitamin B12 level 12/26/2013  . Cough after eating 12/26/2013  . Psoriatic arthritis (HCC) 08/15/2013  . Right arm pain 08/15/2013  . Abnormal TSH 09/21/2012  . Multiple joint pain 09/16/2012  . Morning joint stiffness 09/16/2012  . Visit for preventive health examination  09/09/2011  . Psoriasis   . Depression   . DEPRESSION, SITUATIONAL 09/28/2007  . PSORIASIS 09/28/2007  . BONE PAIN 09/28/2007  . PALPITATIONS, OCCASIONAL 09/28/2007    Past Surgical History:  Procedure Laterality Date  . BUNIONECTOMY     bone spur left foot  . ENDOMETRIAL ABLATION      OB History    Gravida Para Term Preterm AB Living   5 4           SAB TAB Ectopic Multiple Live Births                   Home Medications    Prior to Admission medications   Medication Sig Start Date End Date Taking? Authorizing Provider  azithromycin (ZITHROMAX) 250 MG tablet Take 1 tablet (250 mg total) by mouth daily. Take first 2 tablets together, then 1 every day until finished. 08/17/17   Tilden Fossaees, Saniyya Gau, MD  EQUETRO 200 MG CP12 12 hr capsule Take 200 mg by mouth daily.  12/13/13   Cherly HensenAiken, Christopher B, MD  lamoTRIgine (LAMICTAL) 100 MG tablet Take 200 mg by mouth daily.    Cherly HensenAiken, Christopher B, MD  traZODone (DESYREL) 50 MG tablet Take 50 mg by mouth daily. 07/30/17   [provider]  zolpidem (AMBIEN) 5 MG tablet Take 1 tablet (5 mg total) by mouth at bedtime as needed for sleep. 02/09/15  Panosh, Neta Mends, MD    Family History Family History  Problem Relation Age of Onset  . Anxiety disorder Unknown   . Diabetes Father   . Glaucoma Father   . Myasthenia gravis Mother   . Other Mother        [place]  . Other Brother        devic disease NMO neuromyelitis optica  . Hashimoto's thyroiditis Daughter   . Psoriasis Sister   . Depression Sister   . Arthritis Unknown        parents     Social History Social History   Tobacco Use  . Smoking status: Never Smoker  . Smokeless tobacco: Never Used  Substance Use Topics  . Alcohol use: Yes    Comment: Socially   . Drug use: No     Allergies   Compazine   Review of Systems Review of Systems  Cardiovascular: Positive for chest pain.  All other systems reviewed and are negative.    Physical Exam Updated Vital  Signs BP (!) 167/94   Pulse 71   Temp 98.1 F (36.7 C) (Oral)   Resp 12   SpO2 100%   Physical Exam  Constitutional: She is oriented to person, place, and time. She appears well-developed and well-nourished.  HENT:  Head: Normocephalic and atraumatic.  Cardiovascular: Normal rate and regular rhythm.  No murmur heard. Pulmonary/Chest: Effort normal and breath sounds normal. No respiratory distress.  Abdominal: Soft. There is no rebound and no guarding.  Mild right upper quadrant tenderness  Musculoskeletal: She exhibits no edema or tenderness.  Neurological: She is alert and oriented to person, place, and time.  Skin: Skin is warm and dry.  Psychiatric: She has a normal mood and affect. Her behavior is normal.  Nursing note and vitals reviewed.    ED Treatments / Results  Labs (all labs ordered are listed, but only abnormal results are displayed) Labs Reviewed  BASIC METABOLIC PANEL - Abnormal; Notable for the following components:      Result Value   Sodium 132 (*)    Chloride 99 (*)    Glucose, Bld 106 (*)    All other components within normal limits  CBC - Abnormal; Notable for the following components:   Hemoglobin 11.3 (*)    HCT 33.7 (*)    All other components within normal limits  HEPATIC FUNCTION PANEL - Abnormal; Notable for the following components:   Total Bilirubin 0.2 (*)    Indirect Bilirubin 0.1 (*)    All other components within normal limits  D-DIMER, QUANTITATIVE (NOT AT [facility]) - Abnormal; Notable for the following components:   D-Dimer, Quant 1.29 (*)    All other components within normal limits  TROPONIN I  PREGNANCY, URINE  TROPONIN I  TSH  T4, FREE    EKG  EKG Interpretation  Date/Time:  Monday August 17 2017 17:24:20 EST Ventricular Rate:  81 PR Interval:  154 QRS Duration: 90 QT Interval:  396 QTC Calculation: 460 R Axis:   12 Text Interpretation:  Normal sinus rhythm Normal ECG Confirmed by Tilden Fossa [phone]) on 08/17/2017  7:58:35 PM       Radiology Dg Chest 2 View  Result Date: 08/17/2017 CLINICAL DATA:  Chest pain and shortness of breath for 2 months. EXAM: CHEST  2 VIEW COMPARISON:  None. FINDINGS: The cardiac silhouette, mediastinal and hilar contours are within normal limits. There are bronchitic type lung changes which could suggest bronchitis or smoking changes. No infiltrates  or effusions. No worrisome pulmonary lesions. The bony thorax is intact. IMPRESSION: Bronchitic lung changes could suggest bronchitis or smoking changes. No infiltrates or effusions. Electronically Signed   By: Rudie Meyer M.D.   On: 08/17/2017 20:58   Ct Angio Chest Pe W/cm &/or Wo Cm  Result Date: 08/17/2017 CLINICAL DATA:  To chest pain x1 month with dyspnea. EXAM: CT ANGIOGRAPHY CHEST WITH CONTRAST TECHNIQUE: Multidetector CT imaging of the chest was performed using the standard protocol during bolus administration of intravenous contrast. Multiplanar CT image reconstructions and MIPs were obtained to evaluate the vascular anatomy. CONTRAST:  ISOVUE-370 IOPAMIDOL (ISOVUE-370) INJECTION 76% COMPARISON:  CXR 08/17/2017 FINDINGS: Cardiovascular: Satisfactory opacification of the pulmonary arterial system without acute pulmonary embolus. The thoracic aorta without dissection. Heart size is top normal without pericardial effusion. Mediastinum/Nodes: Hypodense 12 mm right-sided nodule of the mildly enlarged thyroid gland. Coarse calcifications are seen in the lower pole the right thyroid gland. Slight retroclavicular extension of right lobe of the thyroid gland is seen. There is a small hiatal hernia. Esophagus is unremarkable. No adenopathy is seen. The trachea and mainstem bronchi appeared Lungs/Pleura: No alveolar consolidation, effusion or pneumothorax. Faint bilateral ground-glass opacities may represent areas of hypoventilatory change, alveolitis/ pneumonitis or possibly an atypical pneumonia. No dominant mass. Upper Abdomen: No  acute abnormality. Musculoskeletal: No chest wall abnormality. No acute or significant osseous findings. Review of the MIP images confirms the above findings. IMPRESSION: 1. No acute pulmonary embolus. 2. Mild thyromegaly with 12 mm right-sided hypodense thyroid nodule with adjacent coarse calcifications further caudad within the right thyroid lobe. Given patient age and size of nodules, no further evaluation is recommended given lack of suspicious CT findings. This follows ACR consensus guidelines: Managing Incidental Thyroid Nodules Detected on Imaging: White Paper of the ACR Incidental Thyroid Findings Committee. J Am Coll Radiol 2015; 12:143-150. 3. Small hiatal hernia. 4. Faint bilateral ground-glass opacities, nonspecific in appearance may represent areas of hypoventilatory change, alveolitis/ pneumonitis or possibly atypical pneumonia among more common etiologies. Electronically Signed   By: Tollie Eth M.D.   On: 08/17/2017 23:42    Procedures Procedures (including critical care time)  Medications Ordered in ED Medications  iopamidol (ISOVUE-370) 76 % injection 100 mL (100 mLs Intravenous Contrast Given 08/17/17 2306)  acetaminophen (TYLENOL) tablet 650 mg (650 mg Oral Given 08/18/17 0000)     Initial Impression / Assessment and Plan / ED Course  I have reviewed the triage vital signs and the nursing notes.  Pertinent labs & imaging results that were available during my care of the patient were reviewed by me and considered in my medical decision making (see chart for details).     Patient here for evaluation of chest pain that is been intermittent for the last month.  EKG with no acute ischemic changes and she is low risk based on heart score with a score of 2.  D-dimer is elevated and CTA obtained.  CT does demonstrate a thyroid nodule as well as possible pneumonia.  Will treat for community-acquired pneumonia and send thyroid studies.  Counseled patient on importance of PCP follow-up for  recheck.  Discussed with patient elevated blood pressure in the department, recommend initiating blood pressure medications and she declines at this time.  Discussed cardiology follow-up for recheck regarding her chest pain.  Home care return precautions discussed. Final Clinical Impressions(s) / ED Diagnoses   Final diagnoses:  Nonspecific chest pain  Community acquired pneumonia, unspecified laterality  Thyroid nodule  ED Discharge Orders        Ordered    azithromycin (ZITHROMAX) 250 MG tablet  Daily     08/17/17 2356       Tilden Fossa, MD 08/18/17 0157

## 2017-08-18 ENCOUNTER — Telehealth: Payer: Self-pay

## 2017-08-18 ENCOUNTER — Other Ambulatory Visit: Payer: Self-pay | Admitting: *Deleted

## 2017-08-18 LAB — TSH: TSH: 0.248 u[IU]/mL — AB (ref 0.350–4.500)

## 2017-08-18 MED ORDER — DIPHENHYDRAMINE HCL 50 MG/ML IJ SOLN: 25.0000 mg | Freq: Once | INTRAMUSCULAR | Status: DC

## 2017-08-18 MED ORDER — KETOROLAC TROMETHAMINE 15 MG/ML IJ SOLN: 15.0000 mg | Freq: Once | INTRAMUSCULAR | Status: DC

## 2017-08-18 NOTE — Telephone Encounter (Signed)
[facility] (Dr. Adele Danickson's nurse) called to refer patient to Dr. Excell Seltzerooper for R chest pain.  Called patient and scheduled her 1/17 with Dr. Excell Seltzerooper. Patient agrees with treatment plan.

## 2017-08-18 NOTE — Progress Notes (Deleted)
No chief complaint on file.   HPI: Melissa Walters 52 y.o. come in forfu ed  For chest pain of concern   ROS: See pertinent positives and negatives per HPI.  Past Medical History:  Diagnosis Date  . ADD (attention deficit disorder)    poss  . Depression   . Mood disorder (HCC)   . Psoriasis   . Psoriatic arthritis (HCC) 10/2012    Family History  Problem Relation Age of Onset  . Anxiety disorder Unknown   . Diabetes Father   . Glaucoma Father   . Myasthenia gravis Mother   . Other Mother        [place]  . Other Brother        devic disease NMO neuromyelitis optica  . Hashimoto's thyroiditis Daughter   . Psoriasis Sister   . Depression Sister   . Arthritis Unknown        parents     Social History   Socioeconomic History  . Marital status: Married    Spouse name: Not on file  . Number of children: Not on file  . Years of education: Not on file  . Highest education level: Not on file  Social Needs  . Financial resource strain: Not on file  . Food insecurity - worry: Not on file  . Food insecurity - inability: Not on file  . Transportation needs - medical: Not on file  . Transportation needs - non-medical: Not on file  Occupational History  . Not on file  Tobacco Use  . Smoking status: Never Smoker  . Smokeless tobacco: Never Used  Substance and Sexual Activity  . Alcohol use: Yes    Comment: Socially   . Drug use: No  . Sexual activity: Not on file  Other Topics Concern  . Not on file  Social History Narrative    Married   No caffeine   Managing HH   Of 4    Pet dog   Regular exercise- yes  Not much in winter    BA degree       Outpatient Medications Prior to Visit  Medication Sig Dispense Refill  . azithromycin (ZITHROMAX) 250 MG tablet Take 1 tablet (250 mg total) by mouth daily. Take first 2 tablets together, then 1 every day until finished. 6 tablet 0  . EQUETRO 200 MG CP12 12 hr capsule Take 200 mg by mouth daily.     [name] lamoTRIgine  (LAMICTAL) 100 MG tablet Take 200 mg by mouth daily.    . traZODone (DESYREL) 50 MG tablet Take 50 mg by mouth daily.  3  . zolpidem (AMBIEN) 5 MG tablet Take 1 tablet (5 mg total) by mouth at bedtime as needed for sleep. 15 tablet 0   No facility-administered medications prior to visit.      EXAM:  There were no vitals taken for this visit.  There is no height or weight on file to calculate BMI.  GENERAL: vitals reviewed and listed above, alert, oriented, appears well hydrated and in no acute distress HEENT: atraumatic, conjunctiva  clear, no obvious abnormalities on inspection of external nose and ears OP : no lesion edema or exudate  NECK: no obvious masses on inspection palpation  LUNGS: clear to auscultation bilaterally, no wheezes, rales or rhonchi, good air movement CV: HRRR, no clubbing cyanosis or  peripheral edema nl cap refill  MS: moves all extremities without noticeable focal  abnormality PSYCH: pleasant and cooperative, no obvious depression or anxiety  Lab Results  Component Value Date   WBC 7.9 08/17/2017   HGB 11.3 (L) 08/17/2017   HCT 33.7 (L) 08/17/2017   PLT 202 08/17/2017   GLUCOSE 106 (H) 08/17/2017   CHOL 225 (H) 10/31/2016   TRIG 52.0 10/31/2016   HDL 114.80 10/31/2016   LDLDIRECT 51.0 09/16/2012   LDLCALC 99 10/31/2016   ALT 15 08/17/2017   AST 23 08/17/2017   NA 132 (L) 08/17/2017   K 3.8 08/17/2017   CL 99 (L) 08/17/2017   CREATININE 0.76 08/17/2017   BUN 12 08/17/2017   CO2 24 08/17/2017   TSH 0.248 (L) 08/17/2017   BP Readings from Last 3 Encounters:  08/18/17 (!) 167/94  08/17/17 (!) 144/86  03/24/17 138/88    ASSESSMENT AND PLAN:  Discussed the following assessment and plan:  Chest pain, unspecified type  Elevated BP without diagnosis of hypertension  Low TSH level bp elevaetion and  Low tsh  -Patient advised to return or notify health care team  if  new concerns arise.  There are no Patient Instructions on file for this  visit.   Neta MendsWanda K. Stefanny Pieri M.D.

## 2017-08-19 ENCOUNTER — Ambulatory Visit: Payer: BLUE CROSS/BLUE SHIELD | Admitting: Internal Medicine

## 2017-08-19 LAB — T4, FREE: FREE T4: 0.88 ng/dL (ref 0.61–1.12)

## 2017-08-20 NOTE — Progress Notes (Signed)
Chief Complaint  Patient presents with  . Follow-up    Pt has appt scheduled with Cards Dr Excell Seltzerooper 08/27/16. Pt c/o tightness and occ chest pain - not sharp or severe. Pt dx at hospital monday night with atypical PNA, taking Azithomycin - 1 day left.    HPI: Melissa Walters 52 y.o. come in for fu of  Chest pain  Ed visit  And sob    She first noticed a problem when shoveling snow last month and had some right-sided chest pain and some shortness of breath.  Since that time she has had intermittent symptoms and some decrease in exercise tolerance.  She went to the emergency room last week on advice was noted to have a normal EKG elevated d-dimer a CT scan that showed no clotting but some airspace opacity question pneumonitis.  She was placed on azithromycin  She has an appointment with Dr. Excell Seltzerooper cardiologist next week.  No history of cardiac disease but is noticed some decreased exercise if she walks upstairs feels more winded than usual and feels that her resting heart rate is higher.  States that her psychiatric medications are the same.  Her blood pressure has been somewhat elevated at home and she just started Mediterranean DASH diet and monitoring.      Stress.     Mom sick in [place]   Father passed away . And  Sob and pain causing sx.   Also has low tsh with hx of same felt to be from emdication   Had pna in march.   Hx of  chornic  Reflux cough .   Not feeling great.   ROS: See pertinent positives and negatives per HPI. No fever hemoptyusis or tobacco  Psoriatic  Joints ar bothering her and fatigue  Reluctant to go on medication  Past Medical History:  Diagnosis Date  . ADD (attention deficit disorder)    poss  . Depression   . Mood disorder (HCC)   . Psoriasis   . Psoriatic arthritis (HCC) 10/2012    Family History  Problem Relation Age of Onset  . Anxiety disorder Unknown   . Diabetes Father   . Glaucoma Father   . Myasthenia gravis Mother   . Other Mother        [place]  . Other Brother        devic disease NMO neuromyelitis optica  . Hashimoto's thyroiditis Daughter   . Psoriasis Sister   . Depression Sister   . Arthritis Unknown        parents     Social History   Socioeconomic History  . Marital status: Married    Spouse name: None  . Number of children: None  . Years of education: None  . Highest education level: None  Social Needs  . Financial resource strain: None  . Food insecurity - worry: None  . Food insecurity - inability: None  . Transportation needs - medical: None  . Transportation needs - non-medical: None  Occupational History  . None  Tobacco Use  . Smoking status: Never Smoker  . Smokeless tobacco: Never Used  Substance and Sexual Activity  . Alcohol use: Yes    Comment: Socially   . Drug use: No  . Sexual activity: None  Other Topics Concern  . None  Social History Narrative    Married   No caffeine   Managing HH   Of 4    Pet dog   Regular exercise- yes  Not  much in winter    BA degree       Outpatient Medications Prior to Visit  Medication Sig Dispense Refill  . azithromycin (ZITHROMAX) 250 MG tablet Take 1 tablet (250 mg total) by mouth daily. Take first 2 tablets together, then 1 every day until finished. 6 tablet 0  . EQUETRO 200 MG CP12 12 hr capsule Take 200 mg by mouth daily.     [name] lamoTRIgine (LAMICTAL) 100 MG tablet Take 200 mg by mouth daily.    . traZODone (DESYREL) 50 MG tablet Take 50 mg by mouth daily.  3  . zolpidem (AMBIEN) 5 MG tablet Take 1 tablet (5 mg total) by mouth at bedtime as needed for sleep. 15 tablet 0   No facility-administered medications prior to visit.      EXAM:  BP (!) 150/92 (BP Location: Right Arm, Patient Position: Sitting, Cuff Size: Normal)   Pulse 74   Temp 98.1 F (36.7 C) (Oral)   Wt 185 lb 3.2 oz (84 kg)   BMI 29.89 kg/m   Body mass index is 29.89 kg/m. BP 146/82 GENERAL: vitals reviewed and listed above, alert, oriented, appears well hydrated  and in no acute distress  Non toxic  HEENT: atraumatic, conjunctiva  clear, no obvious abnormalities on inspection of external nose and ears OP : no lesion edema or exudate  NECK: no obvious masses on inspection  Thyroid  onpalpation  LUNGS: clear to auscultation bilaterally, no wheezes, rales or rhonchi,  CV: HRRR, no clubbing cyanosis or  peripheral edema nl cap refill  Abdomen:  Sof,t normal bowel sounds without hepatosplenomegaly, no guarding rebound or masses no CVA tenderness MS: moves all extremities without noticeable focal  abnormality PSYCH: pleasant and cooperative, somewhat  Depressed  No but nl speech and thought  Lab Results  Component Value Date   WBC 7.9 08/17/2017   HGB 11.3 (L) 08/17/2017   HCT 33.7 (L) 08/17/2017   PLT 202 08/17/2017   GLUCOSE 106 (H) 08/17/2017   CHOL 225 (H) 10/31/2016   TRIG 52.0 10/31/2016   HDL 114.80 10/31/2016   LDLDIRECT 51.0 09/16/2012   LDLCALC 99 10/31/2016   ALT 15 08/17/2017   AST 23 08/17/2017   NA 132 (L) 08/17/2017   K 3.8 08/17/2017   CL 99 (L) 08/17/2017   CREATININE 0.76 08/17/2017   BUN 12 08/17/2017   CO2 24 08/17/2017   TSH 0.96 08/21/2017   BP Readings from Last 3 Encounters:  08/21/17 (!) 150/92  08/18/17 (!) 167/94  08/17/17 (!) 144/86    ASSESSMENT AND PLAN:  Discussed the following assessment and plan:  Chest pain, unspecified type - Plan: TSH, T4, free, T3, free, Thyroid antibodies, Sedimentation rate, Ambulatory referral to Pulmonology  Pneumonitis  by chest ct  - Plan: Ambulatory referral to Pulmonology  Goiter -  on x ray hx of low tsh  and daughters have hashimotos  - Plan: TSH, T4, free, T3, free, Thyroid antibodies, Sedimentation rate  Abnormal TSH - Plan: TSH, T4, free, T3, free, Thyroid antibodies, Sedimentation rate  Dyspnea, unspecified type - Plan: TSH, T4, free, T3, free, Thyroid antibodies, Sedimentation rate, Ambulatory referral to Pulmonology  Essential hypertension - just implemente lsi   readig in offoice 140 range  losartan low dose order  printed for patient  can get  advice from cards when she sees tme next week.  Need for influenza vaccination - Plan: Flu Vaccine QUAD 6+ mos PF IM (Fluarix Quad PF)  Psoriatic arthritis (HCC)  Agree with cardiology evaluation because of context and change in exercise tolerance but I suspect that her problem may be pulmonary. Follow-through with Cardiologic evaluation prescription printed for losartan if blood pressure remains very high before seeing Dr. Excell Seltzer otherwise could wait on his advice of antihypertensive medication choice.  We will repeat her thyroid test with antibody studies today consideration of having endocrinology see her again as she does have a very strong family history of thyroiditis but this is probably not causing her symptoms today.  She is not taking biotin supplements or others.  -Patient advised to return or notify health care team  if  new concerns arise. Total visit > 50% spent counseling and coordinating care as indicated in above note and in instructions to patient .     Patient Instructions  Will notify you  of labs when available.  I agree with cardiology evaluation   I will make referral to pulmonary as this may be the cause of  Some of your symptoms.  May end up seeing endocrinology  Also  At some point.  Bp  Needs to be addressed   And treated but   Repeat bp 146/82     Can add medication but think its safe   to wait until you see cardiology .       Neta Mends. Bobbiejo Ishikawa M.D.

## 2017-08-21 ENCOUNTER — Ambulatory Visit (INDEPENDENT_AMBULATORY_CARE_PROVIDER_SITE_OTHER): Payer: BLUE CROSS/BLUE SHIELD | Admitting: Internal Medicine

## 2017-08-21 ENCOUNTER — Encounter: Payer: Self-pay | Admitting: Internal Medicine

## 2017-08-21 VITALS — BP 150/92 | HR 74 | Temp 98.1°F | Wt 185.2 lb

## 2017-08-21 DIAGNOSIS — E049 Nontoxic goiter, unspecified: Secondary | ICD-10-CM | POA: Diagnosis not present

## 2017-08-21 DIAGNOSIS — Z23 Encounter for immunization: Secondary | ICD-10-CM | POA: Diagnosis not present

## 2017-08-21 DIAGNOSIS — R7989 Other specified abnormal findings of blood chemistry: Secondary | ICD-10-CM | POA: Diagnosis not present

## 2017-08-21 DIAGNOSIS — L4050 Arthropathic psoriasis, unspecified: Secondary | ICD-10-CM

## 2017-08-21 DIAGNOSIS — I10 Essential (primary) hypertension: Secondary | ICD-10-CM

## 2017-08-21 DIAGNOSIS — R079 Chest pain, unspecified: Secondary | ICD-10-CM | POA: Diagnosis not present

## 2017-08-21 DIAGNOSIS — J189 Pneumonia, unspecified organism: Secondary | ICD-10-CM

## 2017-08-21 DIAGNOSIS — R0600 Dyspnea, unspecified: Secondary | ICD-10-CM | POA: Diagnosis not present

## 2017-08-21 LAB — T3, FREE: T3, Free: 2.9 pg/mL (ref 2.3–4.2)

## 2017-08-21 LAB — SEDIMENTATION RATE: SED RATE: 2 mm/h (ref 0–30)

## 2017-08-21 LAB — TSH: TSH: 0.96 u[IU]/mL (ref 0.35–4.50)

## 2017-08-21 LAB — T4, FREE: Free T4: 0.86 ng/dL (ref 0.60–1.60)

## 2017-08-21 MED ORDER — LOSARTAN POTASSIUM 25 MG PO TABS: 25.0000 mg | ORAL_TABLET | Freq: Every day | ORAL | 1 refills | Status: DC

## 2017-08-21 NOTE — Patient Instructions (Addendum)
Will notify you  of labs when available.  I agree with cardiology evaluation   I will make referral to pulmonary as this may be the cause of  Some of your symptoms.  May end up seeing endocrinology  Also  At some point.  Bp  Needs to be addressed   And treated but   Repeat bp 146/82     Can add medication but think its safe   to wait until you see cardiology .

## 2017-08-24 LAB — THYROID ANTIBODIES

## 2017-08-26 DIAGNOSIS — L814 Other melanin hyperpigmentation: Secondary | ICD-10-CM | POA: Diagnosis not present

## 2017-08-26 DIAGNOSIS — L570 Actinic keratosis: Secondary | ICD-10-CM | POA: Diagnosis not present

## 2017-08-27 ENCOUNTER — Ambulatory Visit (INDEPENDENT_AMBULATORY_CARE_PROVIDER_SITE_OTHER): Payer: BLUE CROSS/BLUE SHIELD | Admitting: Cardiovascular Disease

## 2017-08-27 ENCOUNTER — Encounter: Payer: Self-pay | Admitting: Cardiovascular Disease

## 2017-08-27 VITALS — BP 152/86 | HR 87 | Ht 67.0 in | Wt 189.8 lb

## 2017-08-27 DIAGNOSIS — I209 Angina pectoris, unspecified: Secondary | ICD-10-CM | POA: Diagnosis not present

## 2017-08-27 DIAGNOSIS — R079 Chest pain, unspecified: Secondary | ICD-10-CM | POA: Diagnosis not present

## 2017-08-27 MED ORDER — LOSARTAN POTASSIUM 50 MG PO TABS: 50.0000 mg | ORAL_TABLET | Freq: Every day | ORAL | 11 refills | Status: DC

## 2017-08-27 NOTE — Progress Notes (Signed)
Cardiology Office Note Date:  08/28/2017   ID:  Melissa CoombsMichele F Hessling, DOB 05-06-1966, MRN [id number]  PCP:  Madelin HeadingsPanosh, Wanda K, MD  Cardiologist:  Tonny BollmanMichael Jasmynn Pfalzgraf, MD    Chief Complaint  Patient presents with  . New Patient (Initial Visit)    chest pain  . Chest Pain     History of Present Illness: Melissa Walters is a 52 y.o. female who presents for evaluation of chest pain.  She has been previously healthy, but developed chest and jaw pain shoveling snow last month. Describes feeling as a 'tightness.' She did a lot of shoveling to help her neighbors out. There was associated shortness of breath. Symptoms resolved after 30-45 minutes of rest. She had a similar sensation while she was helping her mother move some things during a move. She felt tightness in the chest. This has subsequently occurred intermittently, but not always associated with strenuous activity. She was evaluated in the emergency room and underwent lab testing, EKG, CXR, and CT angiogram. There was no clear etiology of her chest pain and she was felt to be stable for outpatient follow-up.   Prior to her recent symptoms, she has no history of chest pain or shortness of breath. States that chest pain is occurring fairly consistently with physical activity. She has recently been diagnosed with high blood pressure and has started low dose losartan. No hx of diabetes, tobacco, or family hx of CAD. Has not been treated for hypercholesterolemia.    Past Medical History:  Diagnosis Date  . ADD (attention deficit disorder)    poss  . Depression   . Mood disorder (HCC)   . Psoriasis   . Psoriatic arthritis (HCC) 10/2012    Past Surgical History:  Procedure Laterality Date  . BUNIONECTOMY     bone spur left foot  . ENDOMETRIAL ABLATION      Current Outpatient Medications  Medication Sig Dispense Refill  . aspirin EC 81 MG tablet Take 81 mg by mouth daily.    [name]. EQUETRO 200 MG CP12 12 hr capsule Take 200 mg by mouth daily.      [name]. lamoTRIgine (LAMICTAL) 100 MG tablet Take 200 mg by mouth daily.    [name]. losartan (COZAAR) 50 MG tablet Take 1 tablet (50 mg total) by mouth daily. 30 tablet 11  . traZODone (DESYREL) 50 MG tablet Take 50-150 mg by mouth at bedtime as needed for sleep.   3  . zolpidem (AMBIEN) 5 MG tablet Take 1 tablet (5 mg total) by mouth at bedtime as needed for sleep. 15 tablet 0   No current facility-administered medications for this visit.     Allergies:   Compazine   Social History:  The patient  reports that  has never smoked. she has never used smokeless tobacco. She reports that she drinks alcohol. She reports that she does not use drugs.   Family History:  The patient's  family history includes Anxiety disorder in her unknown relative; Arthritis in her unknown relative; Depression in her sister; Diabetes in her father; Glaucoma in her father; Hashimoto's thyroiditis in her daughter; Myasthenia gravis in her mother; Other in her brother and mother; Psoriasis in her sister.   There is no CAD in the family.  ROS:  Please see the history of present illness.  Otherwise, review of systems is positive for depression, snoring, constipation, headaches.  All other systems are reviewed and negative.    PHYSICAL EXAM: VS:  BP (!) 152/86   Pulse  87   Ht 5\' 7"  (1.702 m)   Wt 189 lb 12.8 oz (86.1 kg)   BMI 29.73 kg/m  , BMI Body mass index is 29.73 kg/m. GEN: Well nourished, well developed, in no acute distress  HEENT: normal  Neck: no JVD, no masses. No carotid bruits Cardiac: RRR without murmur or gallop                Respiratory:  clear to auscultation bilaterally, normal work of breathing GI: soft, nontender, nondistended, + BS MS: no deformity or atrophy  Ext: no pretibial edema, pedal pulses 2+= bilaterally Skin: warm and dry, no rash Neuro:  Strength and sensation are intact Psych: euthymic mood, full affect  EKG:  EKG is ordered today. The ekg ordered August 17, 2017 shows normal sinus  rhythm, within normal limits  Recent Labs: 08/17/2017: ALT 15; BUN 12; Creatinine, Ser 0.76; Hemoglobin 11.3; Platelets 202; Potassium 3.8; Sodium 132 08/21/2017: TSH 0.96   Lipid Panel     Component Value Date/Time   CHOL 225 (H) 10/31/2016 0858   TRIG 52.0 10/31/2016 0858   HDL 114.80 10/31/2016 0858   CHOLHDL 2 10/31/2016 0858   VLDL 10.4 10/31/2016 0858   LDLCALC 99 10/31/2016 0858   LDLDIRECT 51.0 09/16/2012 1240      Wt Readings from Last 3 Encounters:  08/27/17 189 lb 12.8 oz (86.1 kg)  08/21/17 185 lb 3.2 oz (84 kg)  08/17/17 179 lb (81.2 kg)     Cardiac Studies Reviewed: CT Angio Chest: IMPRESSION: 1. No acute pulmonary embolus. 2. Mild thyromegaly with 12 mm right-sided hypodense thyroid nodule with adjacent coarse calcifications further caudad within the right thyroid lobe. Given patient age and size of nodules, no further evaluation is recommended given lack of suspicious CT findings. This follows ACR consensus guidelines: Managing Incidental Thyroid Nodules Detected on Imaging: White Paper of the ACR Incidental Thyroid Findings Committee. J Am Coll Radiol 2015; 12:143-150. 3. Small hiatal hernia. 4. Faint bilateral ground-glass opacities, nonspecific in appearance may represent areas of hypoventilatory change, alveolitis/ pneumonitis or possibly atypical pneumonia among more common etiologies.  ASSESSMENT AND PLAN: Exertional angina: the patient has typical symptoms of exertional angina over the last month with associated shortness of breath on exertion. She does not have a strong risk profile for CAD, but her history is highly suggestive of ischemic chest pain. We discussed the following recommendations:  Take ASA 81 mg daily  Increase losartan to 50 mg daily for better BP control  Further risk assessment with an exercise Myoview stress test  Will follow-up after stress test result available. Would have low threshold for cardiac cath considering her  typical symptoms  HTN, uncontrolled. Increase losartan, likely will need further medicine titration.  Current medicines are reviewed with the patient today.  The patient does not have concerns regarding medicines.  Labs/ tests ordered today include:   Orders Placed This Encounter  Procedures  . MYOCARDIAL PERFUSION IMAGING    Disposition:   FU pending stress test result  Signed, Tonny Bollman, MD  08/28/2017 7:55 AM    [facility] [address] [place], [place], [place]  [address] Phone: [phone]; Fax: [phone]

## 2017-08-27 NOTE — Patient Instructions (Addendum)
Medication Instructions:  1) INCREASE LOSARTAN to 50 mg daily Aspirin 81 mg daily has been added to your medication list.  Labwork: None  Testing/Procedures: Dr. Excell Seltzerooper recommends you have a NUCLEAR STRESS  TEST.  Follow-Up: Your provider recommends that you schedule a follow-up appointment AS NEEDED with Dr. Excell Seltzerooper pending study results.  Any Other Special Instructions Will Be Listed Below (If Applicable).     If you need a refill on your cardiac medications before your next appointment, please call your pharmacy.

## 2017-08-28 ENCOUNTER — Encounter: Payer: Self-pay | Admitting: Cardiovascular Disease

## 2017-08-31 ENCOUNTER — Telehealth (HOSPITAL_COMMUNITY): Payer: Self-pay | Admitting: *Deleted

## 2017-08-31 NOTE — Telephone Encounter (Signed)
Left message on voicemail per DPR in reference to upcoming appointment scheduled on 09/01/17 at 0945 with detailed instructions given per Myocardial Perfusion Study Information Sheet for the test. LM to arrive 15 minutes early, and that it is imperative to arrive on time for appointment to keep from having the test rescheduled. If you need to cancel or reschedule your appointment, please call the office within 24 hours of your appointment. Failure to do so may result in a cancellation of your appointment, and a $50 no show fee. Phone number given for call back for any questions.

## 2017-09-01 ENCOUNTER — Ambulatory Visit (HOSPITAL_COMMUNITY): Payer: BLUE CROSS/BLUE SHIELD | Attending: Cardiovascular Disease

## 2017-09-01 DIAGNOSIS — I10 Essential (primary) hypertension: Secondary | ICD-10-CM | POA: Diagnosis not present

## 2017-09-01 DIAGNOSIS — R079 Chest pain, unspecified: Secondary | ICD-10-CM | POA: Insufficient documentation

## 2017-09-01 LAB — MYOCARDIAL PERFUSION IMAGING
CHL CUP MPHR: 167 {beats}/min
CHL CUP NUCLEAR SDS: 1
CHL CUP NUCLEAR SRS: 0
CSEPHR: 92 %
CSEPPHR: 155 {beats}/min
Estimated workload: 12.5 METS
Exercise duration (min): 9 min
Exercise duration (sec): 30 s
LHR: 0.31
LVDIAVOL: 128 mL (ref 46–106)
LVSYSVOL: 59 mL
NUC STRESS TID: 0.99
Rest HR: 71 {beats}/min
SSS: 1

## 2017-09-01 MED ORDER — TECHNETIUM TC 99M TETROFOSMIN IV KIT
31.9000 | PACK | Freq: Once | INTRAVENOUS | Status: AC | PRN
  Administered 2017-09-01: 31.9 via INTRAVENOUS
  Filled 2017-09-01: qty 32

## 2017-09-01 MED ORDER — TECHNETIUM TC 99M TETROFOSMIN IV KIT
10.7000 | PACK | Freq: Once | INTRAVENOUS | Status: AC | PRN
  Administered 2017-09-01: 10.7 via INTRAVENOUS
  Filled 2017-09-01: qty 11

## 2017-09-04 ENCOUNTER — Telehealth: Payer: Self-pay | Admitting: Cardiovascular Disease

## 2017-09-04 NOTE — Telephone Encounter (Signed)
New message ° ° ° °Patient calling for stress test results. Please call °

## 2017-09-04 NOTE — Telephone Encounter (Signed)
Called patient and advised her that Dr. Excell Seltzerooper has not been able to review her stress test yet. I advised that the reading physician read it as low risk but Dr. Excell Seltzerooper will review and someone from our office will call her with the results. She verbalized understanding and thanked me for the call.

## 2017-09-10 ENCOUNTER — Telehealth: Payer: Self-pay | Admitting: Cardiovascular Disease

## 2017-09-10 MED ORDER — LOSARTAN POTASSIUM 100 MG PO TABS: 100.0000 mg | ORAL_TABLET | Freq: Every day | ORAL | 11 refills | Status: DC

## 2017-09-10 NOTE — Telephone Encounter (Signed)
Dr. Excell Seltzerooper spoke with patient and gave results. He instructed her to INCREASE LOSARTAN to 100 mg daily. She has follow-up with PCP soon to recheck. She will call with any problems.

## 2017-09-10 NOTE — Telephone Encounter (Signed)
Melissa Walters Melissa Walters is calling about her stress test results . Please call .[name].  Thanks

## 2017-09-17 ENCOUNTER — Encounter: Payer: Self-pay | Admitting: Internal Medicine

## 2017-09-17 DIAGNOSIS — F3181 Bipolar II disorder: Secondary | ICD-10-CM | POA: Diagnosis not present

## 2017-09-18 NOTE — Telephone Encounter (Signed)
Waiting on patient to come by and sign records release so that records can be faxed as requested.

## 2017-09-25 ENCOUNTER — Encounter: Payer: Self-pay | Admitting: Family Medicine

## 2017-09-25 ENCOUNTER — Ambulatory Visit (INDEPENDENT_AMBULATORY_CARE_PROVIDER_SITE_OTHER): Payer: BLUE CROSS/BLUE SHIELD | Admitting: Family Medicine

## 2017-09-25 VITALS — BP 180/110 | HR 77 | Temp 98.3°F | Wt 192.2 lb

## 2017-09-25 DIAGNOSIS — R0982 Postnasal drip: Secondary | ICD-10-CM

## 2017-09-25 DIAGNOSIS — J069 Acute upper respiratory infection, unspecified: Secondary | ICD-10-CM | POA: Diagnosis not present

## 2017-09-25 MED ORDER — FLUTICASONE PROPIONATE 50 MCG/ACT NA SUSP: 1.0000 | Freq: Every day | NASAL | 0 refills | Status: DC

## 2017-09-25 NOTE — Progress Notes (Signed)
Subjective:    Patient ID: Melissa Walters, female    DOB: 18-Dec-1965, 52 y.o.   MRN: [id number]  No chief complaint on file.   HPI Patient was seen today for acute concern.  Patient endorses nasal drainage, headache, congestion since yesterday.  She has taken DayQuil.  She denies sore throat, fever, or cough.  Sick contacts include people at work.  Past Medical History:  Diagnosis Date  . ADD (attention deficit disorder)    poss  . Depression   . Mood disorder (HCC)   . Psoriasis   . Psoriatic arthritis (HCC) 10/2012    Allergies  Allergen Reactions  . Compazine Swelling    Throat swelling    ROS General: Denies fever, chills, night sweats, changes in weight, changes in appetite HEENT: Denies ear pain, changes in vision, rhinorrhea, sore throat  +HA, nasal congestion/drainage CV: Denies CP, palpitations, SOB, orthopnea Pulm: Denies SOB, cough, wheezing GI: Denies abdominal pain, nausea, vomiting, diarrhea, constipation GU: Denies dysuria, hematuria, frequency, vaginal discharge Msk: Denies muscle cramps, joint pains Neuro: Denies weakness, numbness, tingling Skin: Denies rashes, bruising Psych: Denies depression, anxiety, hallucinations     Objective:    Blood pressure (!) 180/110, pulse 77, temperature 98.3 F (36.8 C), temperature source Oral, weight 192 lb 3.2 oz (87.2 kg), SpO2 96 %.   Gen. Pleasant, well-nourished, in no distress, normal affect   HEENT: [place]/AT, face symmetric, no scleral icterus, PERRLA, nares patent with clear drainage, pharynx with post nasal drainage and mild erythema, no exudate. Lungs: no accessory muscle use, CTAB, no wheezes or rales  Cardiovascular: RRR, no m/r/g, no peripheral edema Abdomen: BS present, soft, NT/ND Neuro:  A&Ox3, CN II-XII intact, normal gait    Wt Readings from Last 3 Encounters:  09/25/17 192 lb 3.2 oz (87.2 kg)  09/01/17 189 lb (85.7 kg)  08/27/17 189 lb 12.8 oz (86.1 kg)    Lab Results  Component Value  Date   WBC 7.9 08/17/2017   HGB 11.3 (L) 08/17/2017   HCT 33.7 (L) 08/17/2017   PLT 202 08/17/2017   GLUCOSE 106 (H) 08/17/2017   CHOL 225 (H) 10/31/2016   TRIG 52.0 10/31/2016   HDL 114.80 10/31/2016   LDLDIRECT 51.0 09/16/2012   LDLCALC 99 10/31/2016   ALT 15 08/17/2017   AST 23 08/17/2017   NA 132 (L) 08/17/2017   K 3.8 08/17/2017   CL 99 (L) 08/17/2017   CREATININE 0.76 08/17/2017   BUN 12 08/17/2017   CO2 24 08/17/2017   TSH 0.96 08/21/2017    Assessment/Plan:  Viral URI -supportive care.  - Plan: fluticasone (FLONASE) 50 MCG/ACT nasal spray  Post-nasal drainage   - Plan: fluticasone (FLONASE) 50 MCG/ACT nasal spray  Abbe AmsterdamShannon Sheridyn Canino, MD

## 2017-09-25 NOTE — Patient Instructions (Addendum)
Upper Respiratory Infection, Adult Most upper respiratory infections (URIs) are a viral infection of the air passages leading to the lungs. A URI affects the nose, throat, and upper air passages. The most common type of URI is nasopharyngitis and is typically referred to as "the common cold." URIs run their course and usually go away on their own. Most of the time, a URI does not require medical attention, but sometimes a bacterial infection in the upper airways can follow a viral infection. This is called a secondary infection. Sinus and middle ear infections are common types of secondary upper respiratory infections. Bacterial pneumonia can also complicate a URI. A URI can worsen asthma and chronic obstructive pulmonary disease (COPD). Sometimes, these complications can require emergency medical care and may be life threatening. What are the causes? Almost all URIs are caused by viruses. A virus is a type of germ and can spread from one person to another. What increases the risk? You may be at risk for a URI if:  You smoke.  You have chronic heart or lung disease.  You have a weakened defense (immune) system.  You are very young or very old.  You have nasal allergies or asthma.  You work in crowded or poorly ventilated areas.  You work in health care facilities or schools.  What are the signs or symptoms? Symptoms typically develop 2-3 days after you come in contact with a cold virus. Most viral URIs last 7-10 days. However, viral URIs from the influenza virus (flu virus) can last 14-18 days and are typically more severe. Symptoms may include:  Runny or stuffy (congested) nose.  Sneezing.  Cough.  Sore throat.  Headache.  Fatigue.  Fever.  Loss of appetite.  Pain in your forehead, behind your eyes, and over your cheekbones (sinus pain).  Muscle aches.  How is this diagnosed? Your health care provider may diagnose a URI by:  Physical exam.  Tests to check that your  symptoms are not due to another condition such as: ? Strep throat. ? Sinusitis. ? Pneumonia. ? Asthma.  How is this treated? A URI goes away on its own with time. It cannot be cured with medicines, but medicines may be prescribed or recommended to relieve symptoms. Medicines may help:  Reduce your fever.  Reduce your cough.  Relieve nasal congestion.  Follow these instructions at home:  Take medicines only as directed by your health care provider.  Gargle warm saltwater or take cough drops to comfort your throat as directed by your health care provider.  Use a warm mist humidifier or inhale steam from a shower to increase air moisture. This may make it easier to breathe.  Drink enough fluid to keep your urine clear or pale yellow.  Eat soups and other clear broths and maintain good nutrition.  Rest as needed.  Return to work when your temperature has returned to normal or as your health care provider advises. You may need to stay home longer to avoid infecting others. You can also use a face mask and careful hand washing to prevent spread of the virus.  Increase the usage of your inhaler if you have asthma.  Do not use any tobacco products, including cigarettes, chewing tobacco, or electronic cigarettes. If you need help quitting, ask your health care provider. How is this prevented? The best way to protect yourself from getting a cold is to practice good hygiene.  Avoid oral or hand contact with people with cold symptoms.  Wash your   hands often if contact occurs.  There is no clear evidence that vitamin C, vitamin E, echinacea, or exercise reduces the chance of developing a cold. However, it is always recommended to get plenty of rest, exercise, and practice good nutrition. Contact a health care provider if:  You are getting worse rather than better.  Your symptoms are not controlled by medicine.  You have chills.  You have worsening shortness of breath.  You have  brown or red mucus.  You have yellow or brown nasal discharge.  You have pain in your face, especially when you bend forward.  You have a fever.  You have swollen neck glands.  You have pain while swallowing.  You have white areas in the back of your throat. Get help right away if:  You have severe or persistent: ? Headache. ? Ear pain. ? Sinus pain. ? Chest pain.  You have chronic lung disease and any of the following: ? Wheezing. ? Prolonged cough. ? Coughing up blood. ? A change in your usual mucus.  You have a stiff neck.  You have changes in your: ? Vision. ? Hearing. ? Thinking. ? Mood. This information is not intended to replace advice given to you by your health care provider. Make sure you discuss any questions you have with your health care provider. Document Released: 01/21/2001 Document Revised: 03/30/2016 Document Reviewed: 11/02/2013 Elsevier Interactive Patient Education  2018 [employer]  Postnasal Drip Postnasal drip is the feeling of mucus going down the back of your throat. Mucus is a slimy substance that moistens and cleans your nose and throat, as well as the air pockets in face bones near your forehead and cheeks (sinuses). Small amounts of mucus pass from your nose and sinuses down the back of your throat all the time. This is normal. When you produce too much mucus or the mucus gets too thick, you can feel it. Some common causes of postnasal drip include:  Having more mucus because of: ? A cold or the flu. ? Allergies. ? Cold air. ? Certain medicines.  Having more mucus that is thicker because of: ? A sinus or nasal infection. ? Dry air. ? A food allergy.  Follow these instructions at home: Relieving discomfort  Gargle with a salt-water mixture 3-4 times a day or as needed. To make a salt-water mixture, completely dissolve -1 tsp of salt in 1 cup of warm water.  If the air in your home is dry, use a humidifier to add moisture to  the air.  Use a saline spray or container (neti pot) to flush out the nose (nasal irrigation). These methods can help clear away mucus and keep the nasal passages moist. General instructions  Take over-the-counter and prescription medicines only as told by your health care provider.  Follow instructions from your health care provider about eating or drinking restrictions. You may need to avoid caffeine.  Avoid things that you know you are allergic to (allergens), like dust, mold, pollen, pets, or certain foods.  Drink enough fluid to keep your urine pale yellow.  Keep all follow-up visits as told by your health care provider. This is important. Contact a health care provider if:  You have a fever.  You have a sore throat.  You have difficulty swallowing.  You have headache.  You have sinus pain.  You have a cough that does not go away.  The mucus from your nose becomes thick and is green or yellow in color.  You   have cold or flu symptoms that last more than 10 days. Summary  Postnasal drip is the feeling of mucus going down the back of your throat.  If your health care provider approves, use nasal irrigation or a nasal spray 2?4 times a day.  Avoid things that you know you are allergic to (allergens), like dust, mold, pollen, pets, or certain foods. This information is not intended to replace advice given to you by your health care provider. Make sure you discuss any questions you have with your health care provider. Document Released: 11/10/2016 Document Revised: 11/10/2016 Document Reviewed: 11/10/2016 Elsevier Interactive Patient Education  2018 [employer]  

## 2017-11-06 NOTE — Progress Notes (Deleted)
No chief complaint on file.   HPI: [name]  52 y.o. comes in today for Preventive Health Care visit   Health Maintenance  Topic Date Due  . Hepatitis C Screening  1966-04-10  . HIV Screening  06/11/1979  . MAMMOGRAM  07/11/2018  . PAP SMEAR  07/31/2019  . TETANUS/TDAP  09/08/2021  . COLONOSCOPY  09/27/2024  . INFLUENZA VACCINE  Completed   Health Maintenance Review LIFESTYLE:  Exercise:   Tobacco/ETS: Alcohol:  Sugar beverages: Sleep: Drug use: no HH of  Work:    ROS:  GEN/ HEENT: No fever, significant weight changes sweats headaches vision problems hearing changes, CV/ PULM; No chest pain shortness of breath cough, syncope,edema  change in exercise tolerance. GI /GU: No adominal pain, vomiting, change in bowel habits. No blood in the stool. No significant GU symptoms. SKIN/HEME: ,no acute skin rashes suspicious lesions or bleeding. No lymphadenopathy, nodules, masses.  NEURO/ PSYCH:  No neurologic signs such as weakness numbness. No depression anxiety. IMM/ Allergy: No unusual infections.  Allergy .   REST of 12 system review negative except as per HPI   Past Medical History:  Diagnosis Date  . ADD (attention deficit disorder)    poss  . Depression   . Mood disorder (HCC)   . Psoriasis   . Psoriatic arthritis (HCC) 10/2012    Past Surgical History:  Procedure Laterality Date  . BUNIONECTOMY     bone spur left foot  . ENDOMETRIAL ABLATION      Family History  Problem Relation Age of Onset  . Anxiety disorder Unknown   . Diabetes Father   . Glaucoma Father   . Myasthenia gravis Mother   . Other Mother        [place]  . Other Brother        devic disease NMO neuromyelitis optica  . Hashimoto's thyroiditis Daughter   . Psoriasis Sister   . Depression Sister   . Arthritis Unknown        parents     Social History   Socioeconomic History  . Marital status: Married    Spouse name: Not on file  . Number of children: Not on file    . Years of education: Not on file  . Highest education level: Not on file  Occupational History  . Not on file  Social Needs  . Financial resource strain: Not on file  . Food insecurity:    Worry: Not on file    Inability: Not on file  . Transportation needs:    Medical: Not on file    Non-medical: Not on file  Tobacco Use  . Smoking status: Never Smoker  . Smokeless tobacco: Never Used  Substance and Sexual Activity  . Alcohol use: Yes    Comment: Socially   . Drug use: No  . Sexual activity: Yes  Lifestyle  . Physical activity:    Days per week: Not on file    Minutes per session: Not on file  . Stress: Not on file  Relationships  . Social connections:    Talks on phone: Not on file    Gets together: Not on file    Attends religious service: Not on file    Active member of club or organization: Not on file    Attends meetings of clubs or organizations: Not on file    Relationship status: Not on file  Other Topics Concern  . Not on file  Social History  Narrative    Married   No caffeine   Managing HH   Of 4    Pet dog   Regular exercise- yes  Not much in winter    BA degree       Outpatient Medications Prior to Visit  Medication Sig Dispense Refill  . aspirin EC 81 MG tablet Take 81 mg by mouth daily.    [name]. EQUETRO 200 MG CP12 12 hr capsule Take 200 mg by mouth daily.     . fluticasone (FLONASE) 50 MCG/ACT nasal spray Place 1 spray into both nostrils daily. 16 g 0  . lamoTRIgine (LAMICTAL) 100 MG tablet Take 200 mg by mouth daily.    [name]. losartan (COZAAR) 100 MG tablet Take 1 tablet (100 mg total) by mouth daily. (Patient not taking: Reported on 09/25/2017) 30 tablet 11  . traZODone (DESYREL) 50 MG tablet Take 50-150 mg by mouth at bedtime as needed for sleep.   3  . zolpidem (AMBIEN) 5 MG tablet Take 1 tablet (5 mg total) by mouth at bedtime as needed for sleep. 15 tablet 0   No facility-administered medications prior to visit.      EXAM:  There were no  vitals taken for this visit.  There is no height or weight on file to calculate BMI. Wt Readings from Last 3 Encounters:  09/25/17 192 lb 3.2 oz (87.2 kg)  09/01/17 189 lb (85.7 kg)  08/27/17 189 lb 12.8 oz (86.1 kg)    Physical Exam: Vital signs reviewed ZOX:WRUEGEN:This is a well-developed well-nourished alert cooperative    who appearsr stated age in no acute distress.  HEENT: normocephalic atraumatic , Eyes: PERRL EOM's full, conjunctiva clear, Nares: paten,t no deformity discharge or tenderness., Ears: no deformity EAC's clear TMs with normal landmarks. Mouth: clear OP, no lesions, edema.  Moist mucous membranes. Dentition in adequate repair. NECK: supple without masses, thyromegaly or bruits. CHEST/PULM:  Clear to auscultation and percussion breath sounds equal no wheeze , rales or rhonchi. No chest wall deformities or tenderness. Breast: normal by inspection . No dimpling, discharge, masses, tenderness or discharge . CV: PMI is nondisplaced, S1 S2 no gallops, murmurs, rubs. Peripheral pulses are full without delay.No JVD .  ABDOMEN: Bowel sounds normal nontender  No guard or rebound, no hepato splenomegal no CVA tenderness.  No hernia. Extremtities:  No clubbing cyanosis or edema, no acute joint swelling or redness no focal atrophy NEURO:  Oriented x3, cranial nerves 3-12 appear to be intact, no obvious focal weakness,gait within normal limits no abnormal reflexes or asymmetrical SKIN: No acute rashes normal turgor, color, no bruising or petechiae. PSYCH: Oriented, good eye contact, no obvious depression anxiety, cognition and judgment appear normal. LN: no cervical axillary inguinal adenopathy  Lab Results  Component Value Date   WBC 7.9 08/17/2017   HGB 11.3 (L) 08/17/2017   HCT 33.7 (L) 08/17/2017   PLT 202 08/17/2017   GLUCOSE 106 (H) 08/17/2017   CHOL 225 (H) 10/31/2016   TRIG 52.0 10/31/2016   HDL 114.80 10/31/2016   LDLDIRECT 51.0 09/16/2012   LDLCALC 99 10/31/2016   ALT 15  08/17/2017   AST 23 08/17/2017   NA 132 (L) 08/17/2017   K 3.8 08/17/2017   CL 99 (L) 08/17/2017   CREATININE 0.76 08/17/2017   BUN 12 08/17/2017   CO2 24 08/17/2017   TSH 0.96 08/21/2017    BP Readings from Last 3 Encounters:  09/25/17 (!) 180/110  08/27/17 (!) 152/86  08/21/17 (!) 150/92  Lab results reviewed with patient   ASSESSMENT AND PLAN:  Discussed the following assessment and plan:  Visit for preventive health examination  Essential hypertension  Medication management  Patient Care Team: Darryel Diodato, Neta Mends, MD as PCP - Jerelene Redden, MD as PCP - Cardiology (Cardiology) Cherly Hensen, MD (Psychiatry) Campbell Stall, MD as Attending Physician (Dermatology) Levi Aland, MD as Attending Physician (Obstetrics and Gynecology) There are no Patient Instructions on file for this visit.  Neta Mends. Shontia Gillooly M.D.

## 2017-11-09 ENCOUNTER — Encounter: Payer: BLUE CROSS/BLUE SHIELD | Admitting: Internal Medicine

## 2017-11-11 ENCOUNTER — Encounter: Payer: BLUE CROSS/BLUE SHIELD | Admitting: Internal Medicine

## 2017-11-23 NOTE — Progress Notes (Signed)
Chief Complaint  Patient presents with  . Annual Exam    Pt c/ sore throat x 2-3 days. Coughing with clear mucous. Pt thinks that she may have had a low grade temp yesterday but was not confirmed. Body aches.     HPI: [name]  52 y.o. comes in today for Preventive Health Care visit  She has a form for health insurance She also has had achiness and a bad sore throat with some cough for a couple days.  Not really productive    Could have had low grade   Fever.   No strep  exxposures   Having a hard time losing weight has had 3 weeks of change in diet trying to exercise except when she is sick.  Because of the history of nodules in her thyroid history of abnormal test and family history would like to be referred to endocrinology she saw 1 years ago.  arthtritis doing.  About the same no meds for this.  Blood pressure medicine losartan 100 mg adjusted in January blood pressure is still not quite at goal gets 130s-140s.   Health Maintenance  Topic Date Due  . Hepatitis C Screening  August 09, 1966  . HIV Screening  06/11/1979  . INFLUENZA VACCINE  03/11/2018  . MAMMOGRAM  07/11/2018  . PAP SMEAR  07/31/2019  . TETANUS/TDAP  09/08/2021  . COLONOSCOPY  09/27/2024   Health Maintenance Review LIFESTYLE:  Exercise:  increase lately   4 d per week  Tobacco/ETS: no Alcohol:  2-3  Per night  And  Dec now.  Sugar beverages:  no Sleep: 6 hours  Drug use: no HH of  3 pet dog  Work:  About 15 hours    .   ROS: see hpi  GEN/ HEENT: No fever, significant weight changes sweats headaches vision problems hearing changes, CV/ PULM; No chest pain shortness of breath cough, syncope,edema  change in exercise tolerance. GI /GU: No adominal pain, vomiting, change in bowel habits. No blood in the stool. No significant GU symptoms. SKIN/HEME: ,no acute skin rashes suspicious lesions or bleeding. No lymphadenopathy, nodules, masses.  NEURO/ PSYCH:  No neurologic signs such as  weakness numbness. No depression anxiety. IMM/ Allergy: No unusual infections.  Allergy .   REST of 12 system review negative except as per HPI   Past Medical History:  Diagnosis Date  . ADD (attention deficit disorder)    poss  . Depression   . Mood disorder ([place])   . Psoriasis   . Psoriatic arthritis ([place]) 10/2012    Past Surgical History:  Procedure Laterality Date  . BUNIONECTOMY     bone spur left foot  . ENDOMETRIAL ABLATION      Family History  Problem Relation Age of Onset  . Anxiety disorder Unknown   . Diabetes Father   . Glaucoma Father   . Myasthenia gravis Mother   . Other Mother        [place]  . Other Brother        devic disease NMO neuromyelitis optica  . Hashimoto's thyroiditis Daughter   . Psoriasis Sister   . Depression Sister   . Arthritis Unknown        parents   family sx colon cancer had colon 2016 ? On 5 year recal?   Social History   Socioeconomic History  . Marital status: Married    Spouse name: Not on file  . Number of children: Not on file  .  Years of education: Not on file  . Highest education level: Not on file  Occupational History  . Not on file  Social Needs  . Financial resource strain: Not on file  . Food insecurity:    Worry: Not on file    Inability: Not on file  . Transportation needs:    Medical: Not on file    Non-medical: Not on file  Tobacco Use  . Smoking status: Never Smoker  . Smokeless tobacco: Never Used  Substance and Sexual Activity  . Alcohol use: Yes    Comment: Socially   . Drug use: No  . Sexual activity: Yes  Lifestyle  . Physical activity:    Days per week: Not on file    Minutes per session: Not on file  . Stress: Not on file  Relationships  . Social connections:    Talks on phone: Not on file    Gets together: Not on file    Attends religious service: Not on file    Active member of club or organization: Not on file    Attends meetings of clubs or organizations: Not on file     Relationship status: Not on file  Other Topics Concern  . Not on file  Social History Narrative    Married   No caffeine   Managing HH   Of 4    Pet dog   Regular exercise- yes  Not much in winter    BA degree       Outpatient Medications Prior to Visit  Medication Sig Dispense Refill  . aspirin EC 81 MG tablet Take 81 mg by mouth daily.    . Carbamazepine (EQUETRO) 300 MG CP12 Take 300 mg by mouth daily.     . fluticasone (FLONASE) 50 MCG/ACT nasal spray Place 1 spray into both nostrils daily. 16 g 0  . lamoTRIgine (LAMICTAL) 100 MG tablet Take 200 mg by mouth daily.    [name] losartan (COZAAR) 100 MG tablet Take 1 tablet (100 mg total) by mouth daily. 30 tablet 11  . traZODone (DESYREL) 50 MG tablet Take 50-150 mg by mouth at bedtime as needed for sleep.   3  . zolpidem (AMBIEN) 5 MG tablet Take 1 tablet (5 mg total) by mouth at bedtime as needed for sleep. 15 tablet 0   No facility-administered medications prior to visit.      EXAM:  BP (!) 142/90 (BP Location: Right Arm, Patient Position: Sitting, Cuff Size: Normal)   Pulse 77   Temp 98.4 F (36.9 C) (Oral)   Ht 5' 6.25" (1.683 m)   Wt 183 lb 9.6 oz (83.3 kg)   BMI 29.41 kg/m   Body mass index is 29.41 kg/m. Wt Readings from Last 3 Encounters:  11/24/17 183 lb 9.6 oz (83.3 kg)  09/25/17 192 lb 3.2 oz (87.2 kg)  09/01/17 189 lb (85.7 kg)    Physical Exam: Vital signs reviewed CBS:WHQP is a well-developed well-nourished alert cooperative    who appearsr stated age in no acute distress. Congested and under the weather non toxic  HEENT: normocephalic atraumatic , Eyes: PERRL EOM's full, conjunctiva clear, Nares: paten,t no deformity discharge or tenderness., Ears: no deformity EAC's clear TMs with normal landmarks. Mouth: clear OP, no lesions, edema.1+ red  ocass cough   Moist mucous membranes. Dentition in adequate repair. NECK: supple without masses, thyroid palpable  no bruits. CHEST/PULM:  Clear to auscultation and  percussion breath sounds equal no wheeze , rales or rhonchi.  No chest wall deformities or tenderness. Breast: normal by inspection . No dimpling, discharge, masses, tenderness or discharge . CV: PMI is nondisplaced, S1 S2 no gallops, murmurs, rubs. Peripheral pulses are full without delay.No JVD .  ABDOMEN: Bowel sounds normal nontender  No guard or rebound, no hepato splenomegal no CVA tenderness.  Extremtities:  No clubbing cyanosis or edema, no acute joint swelling or redness no focal atrophy NEURO:  Oriented x3, cranial nerves 3-12 appear to be intact, no obvious focal weakness,gait within normal limits no abnormal reflexes or asymmetrical SKIN: No acute rashes normal turgor, color, no bruising or petechiae. PSYCH: Oriented, good eye contact, no obvious depression anxiety, cognition and judgment appear normal. LN: no cervical axillary inguinal adenopathy  Lab Results  Component Value Date   WBC 7.9 08/17/2017   HGB 11.3 (L) 08/17/2017   HCT 33.7 (L) 08/17/2017   PLT 202 08/17/2017   GLUCOSE 106 (H) 08/17/2017   CHOL 225 (H) 10/31/2016   TRIG 52.0 10/31/2016   HDL 114.80 10/31/2016   LDLDIRECT 51.0 09/16/2012   LDLCALC 99 10/31/2016   ALT 15 08/17/2017   AST 23 08/17/2017   NA 132 (L) 08/17/2017   K 3.8 08/17/2017   CL 99 (L) 08/17/2017   CREATININE 0.76 08/17/2017   BUN 12 08/17/2017   CO2 24 08/17/2017   TSH 0.96 08/21/2017    BP Readings from Last 3 Encounters:  11/24/17 (!) 142/90  09/25/17 (!) 180/110  08/27/17 (!) 152/86    Lab results reviewed with patient   ASSESSMENT AND PLAN:  Discussed the following assessment and plan:  Visit for preventive health examination - Plan: Lipid panel, Basic metabolic panel, Hemoglobin A1c  Medication management - Plan: Lipid panel, Basic metabolic panel, Hemoglobin A1c  Hyperlipidemia, unspecified hyperlipidemia type - Plan: Lipid panel, Basic metabolic panel, Hemoglobin A1c  Fasting hyperglycemia - Plan: Lipid panel,  Basic metabolic panel, Hemoglobin A1c  Mild anemia - Plan: Lipid panel, Basic metabolic panel, Hemoglobin A1c, CBC with Differential/Platelet  Sore throat - Plan: POC Influenza A&B (Binax test), Lipid panel, Basic metabolic panel, Hemoglobin A1c  Essential hypertension - Plan: Lipid panel, Basic metabolic panel, Hemoglobin A1c  Goiter - Plan: Ambulatory referral to Endocrinology  Abnormal TSH - Plan: Ambulatory referral to Endocrinology She is not fasting today but her form for wellness insurance is due and overdue we will check her random lipids blood sugar and recheck her CBC for anemia. At this time I believe that her illness is viral flulike despite the negative flu test expectant management. In regard to blood pressure indeed she is not at goal we will check some readings send them in and we will plan adjustment of medicine and follow-up depending on her results. Requests lab result to be sent to her psych   Patient Care Team: Panosh, Standley Brooking, MD as PCP - Cyndia Diver, MD as PCP - Cardiology (Cardiology) Eliseo Gum, MD (Psychiatry) Olga Millers, MD as Attending Physician (Obstetrics and Gynecology) Patient Instructions  After you are  Well.    Send in Take blood pressure readings twice a day for 7- 10 days  .To ensure below 140/90   .Send in readings     We may eed to add medication     Will send referral to   endocrine again   Decrease portion size and  calories beverages ( wine)   Then plan follow up  Sore   Throat  seems viral cause and should runs its course.  Health Maintenance, Female Adopting a healthy lifestyle and getting preventive care can go a long way to promote health and wellness. Talk with your health care provider about what schedule of regular examinations is right for you. This is a good chance for you to check in with your provider about disease prevention and staying healthy. In between checkups, there are plenty of things you  can do on your own. Experts have done a lot of research about which lifestyle changes and preventive measures are most likely to keep you healthy. Ask your health care provider for more information. Weight and diet Eat a healthy diet  Be sure to include plenty of vegetables, fruits, low-fat dairy products, and lean protein.  Do not eat a lot of foods high in solid fats, added sugars, or salt.  Get regular exercise. This is one of the most important things you can do for your health. ? Most adults should exercise for at least 150 minutes each week. The exercise should increase your heart rate and make you sweat (moderate-intensity exercise). ? Most adults should also do strengthening exercises at least twice a week. This is in addition to the moderate-intensity exercise.  Maintain a healthy weight  Body mass index (BMI) is a measurement that can be used to identify possible weight problems. It estimates body fat based on height and weight. Your health care provider can help determine your BMI and help you achieve or maintain a healthy weight.  For females 20 years of age and older: ? A BMI below 18.5 is considered underweight. ? A BMI of 18.5 to 24.9 is normal. ? A BMI of 25 to 29.9 is considered overweight. ? A BMI of 30 and above is considered obese.  Watch levels of cholesterol and blood lipids  You should start having your blood tested for lipids and cholesterol at 54 years of age, then have this test every 5 years.  You may need to have your cholesterol levels checked more often if: ? Your lipid or cholesterol levels are high. ? You are older than 54 years of age. ? You are at high risk for heart disease.  Cancer screening Lung Cancer  Lung cancer screening is recommended for adults 25-40 years old who are at high risk for lung cancer because of a history of smoking.  A yearly low-dose CT scan of the lungs is recommended for people who: ? Currently smoke. ? Have quit within  the past 15 years. ? Have at least a 30-pack-year history of smoking. A pack year is smoking an average of one pack of cigarettes a day for 1 year.  Yearly screening should continue until it has been 15 years since you quit.  Yearly screening should stop if you develop a health problem that would prevent you from having lung cancer treatment.  Breast Cancer  Practice breast self-awareness. This means understanding how your breasts normally appear and feel.  It also means doing regular breast self-exams. Let your health care provider know about any changes, no matter how small.  If you are in your 20s or 30s, you should have a clinical breast exam (CBE) by a health care provider every 1-3 years as part of a regular health exam.  If you are 61 or older, have a CBE every year. Also consider having a breast X-ray (mammogram) every year.  If you have a family history of breast cancer, talk to your health care provider about genetic screening.  If you are at high  risk for breast cancer, talk to your health care provider about having an MRI and a mammogram every year.  Breast cancer gene (BRCA) assessment is recommended for women who have family members with BRCA-related cancers. BRCA-related cancers include: ? Breast. ? Ovarian. ? Tubal. ? Peritoneal cancers.  Results of the assessment will determine the need for genetic counseling and BRCA1 and BRCA2 testing.  Cervical Cancer Your health care provider may recommend that you be screened regularly for cancer of the pelvic organs (ovaries, uterus, and vagina). This screening involves a pelvic examination, including checking for microscopic changes to the surface of your cervix (Pap test). You may be encouraged to have this screening done every 3 years, beginning at age 49.  For women ages 55-65, health care providers may recommend pelvic exams and Pap testing every 3 years, or they may recommend the Pap and pelvic exam, combined with testing  for human papilloma virus (HPV), every 5 years. Some types of HPV increase your risk of cervical cancer. Testing for HPV may also be done on women of any age with unclear Pap test results.  Other health care providers may not recommend any screening for nonpregnant women who are considered low risk for pelvic cancer and who do not have symptoms. Ask your health care provider if a screening pelvic exam is right for you.  If you have had past treatment for cervical cancer or a condition that could lead to cancer, you need Pap tests and screening for cancer for at least 20 years after your treatment. If Pap tests have been discontinued, your risk factors (such as having a new sexual partner) need to be reassessed to determine if screening should resume. Some women have medical problems that increase the chance of getting cervical cancer. In these cases, your health care provider may recommend more frequent screening and Pap tests.  Colorectal Cancer  This type of cancer can be detected and often prevented.  Routine colorectal cancer screening usually begins at 54 years of age and continues through 54 years of age.  Your health care provider may recommend screening at an earlier age if you have risk factors for colon cancer.  Your health care provider may also recommend using home test kits to check for hidden blood in the stool.  A small camera at the end of a tube can be used to examine your colon directly (sigmoidoscopy or colonoscopy). This is done to check for the earliest forms of colorectal cancer.  Routine screening usually begins at age 34.  Direct examination of the colon should be repeated every 5-10 years through 54 years of age. However, you may need to be screened more often if early forms of precancerous polyps or small growths are found.  Skin Cancer  Check your skin from head to toe regularly.  Tell your health care provider about any new moles or changes in moles, especially  if there is a change in a mole's shape or color.  Also tell your health care provider if you have a mole that is larger than the size of a pencil eraser.  Always use sunscreen. Apply sunscreen liberally and repeatedly throughout the day.  Protect yourself by wearing long sleeves, pants, a wide-brimmed hat, and sunglasses whenever you are outside.  Heart disease, diabetes, and high blood pressure  High blood pressure causes heart disease and increases the risk of stroke. High blood pressure is more likely to develop in: ? People who have blood pressure in the high end  of the normal range (130-139/85-89 mm Hg). ? People who are overweight or obese. ? People who are African American.  If you are 49-65 years of age, have your blood pressure checked every 3-5 years. If you are 57 years of age or older, have your blood pressure checked every year. You should have your blood pressure measured twice-once when you are at a hospital or clinic, and once when you are not at a hospital or clinic. Record the average of the two measurements. To check your blood pressure when you are not at a hospital or clinic, you can use: ? An automated blood pressure machine at a pharmacy. ? A home blood pressure monitor.  If you are between 25 years and 64 years old, ask your health care provider if you should take aspirin to prevent strokes.  Have regular diabetes screenings. This involves taking a blood sample to check your fasting blood sugar level. ? If you are at a normal weight and have a low risk for diabetes, have this test once every three years after 54 years of age. ? If you are overweight and have a high risk for diabetes, consider being tested at a younger age or more often. Preventing infection Hepatitis B  If you have a higher risk for hepatitis B, you should be screened for this virus. You are considered at high risk for hepatitis B if: ? You were born in a country where hepatitis B is common. Ask  your health care provider which countries are considered high risk. ? Your parents were born in a high-risk country, and you have not been immunized against hepatitis B (hepatitis B vaccine). ? You have HIV or AIDS. ? You use needles to inject street drugs. ? You live with someone who has hepatitis B. ? You have had sex with someone who has hepatitis B. ? You get hemodialysis treatment. ? You take certain medicines for conditions, including cancer, organ transplantation, and autoimmune conditions.  Hepatitis C  Blood testing is recommended for: ? Everyone born from [birth year] through 1967. ? Anyone with known risk factors for hepatitis C.  Sexually transmitted infections (STIs)  You should be screened for sexually transmitted infections (STIs) including gonorrhea and chlamydia if: ? You are sexually active and are younger than 54 years of age. ? You are older than 54 years of age and your health care provider tells you that you are at risk for this type of infection. ? Your sexual activity has changed since you were last screened and you are at an increased risk for chlamydia or gonorrhea. Ask your health care provider if you are at risk.  If you do not have HIV, but are at risk, it may be recommended that you take a prescription medicine daily to prevent HIV infection. This is called pre-exposure prophylaxis (PrEP). You are considered at risk if: ? You are sexually active and do not regularly use condoms or know the HIV status of your partner(s). ? You take drugs by injection. ? You are sexually active with a partner who has HIV.  Talk with your health care provider about whether you are at high risk of being infected with HIV. If you choose to begin PrEP, you should first be tested for HIV. You should then be tested every 3 months for as long as you are taking PrEP. Pregnancy  If you are premenopausal and you may become pregnant, ask your health care provider about preconception  counseling.  If you may  become pregnant, take 400 to 800 micrograms (mcg) of folic acid every day.  If you want to prevent pregnancy, talk to your health care provider about birth control (contraception). Osteoporosis and menopause  Osteoporosis is a disease in which the bones lose minerals and strength with aging. This can result in serious bone fractures. Your risk for osteoporosis can be identified using a bone density scan.  If you are 79 years of age or older, or if you are at risk for osteoporosis and fractures, ask your health care provider if you should be screened.  Ask your health care provider whether you should take a calcium or vitamin D supplement to lower your risk for osteoporosis.  Menopause may have certain physical symptoms and risks.  Hormone replacement therapy may reduce some of these symptoms and risks. Talk to your health care provider about whether hormone replacement therapy is right for you. Follow these instructions at home:  Schedule regular health, dental, and eye exams.  Stay current with your immunizations.  Do not use any tobacco products including cigarettes, chewing tobacco, or electronic cigarettes.  If you are pregnant, do not drink alcohol.  If you are breastfeeding, limit how much and how often you drink alcohol.  Limit alcohol intake to no more than 1 drink per day for nonpregnant women. One drink equals 12 ounces of beer, 5 ounces of wine, or 1 ounces of hard liquor.  Do not use street drugs.  Do not share needles.  Ask your health care provider for help if you need support or information about quitting drugs.  Tell your health care provider if you often feel depressed.  Tell your health care provider if you have ever been abused or do not feel safe at home. This information is not intended to replace advice given to you by your health care provider. Make sure you discuss any questions you have with your health care provider. Document  Released: 02/10/2011 Document Revised: 01/03/2016 Document Reviewed: 05/01/2015 Elsevier Interactive Patient Education  2018 [employer]. Panosh M.D.

## 2017-11-24 ENCOUNTER — Ambulatory Visit (INDEPENDENT_AMBULATORY_CARE_PROVIDER_SITE_OTHER): Payer: BLUE CROSS/BLUE SHIELD | Admitting: Internal Medicine

## 2017-11-24 ENCOUNTER — Encounter: Payer: Self-pay | Admitting: Internal Medicine

## 2017-11-24 VITALS — BP 142/90 | HR 77 | Temp 98.4°F | Ht 66.25 in | Wt 183.6 lb

## 2017-11-24 DIAGNOSIS — R7989 Other specified abnormal findings of blood chemistry: Secondary | ICD-10-CM

## 2017-11-24 DIAGNOSIS — J029 Acute pharyngitis, unspecified: Secondary | ICD-10-CM

## 2017-11-24 DIAGNOSIS — R7301 Impaired fasting glucose: Secondary | ICD-10-CM

## 2017-11-24 DIAGNOSIS — D649 Anemia, unspecified: Secondary | ICD-10-CM | POA: Diagnosis not present

## 2017-11-24 DIAGNOSIS — I10 Essential (primary) hypertension: Secondary | ICD-10-CM | POA: Diagnosis not present

## 2017-11-24 DIAGNOSIS — Z0000 Encounter for general adult medical examination without abnormal findings: Secondary | ICD-10-CM

## 2017-11-24 DIAGNOSIS — E785 Hyperlipidemia, unspecified: Secondary | ICD-10-CM

## 2017-11-24 DIAGNOSIS — Z79899 Other long term (current) drug therapy: Secondary | ICD-10-CM

## 2017-11-24 DIAGNOSIS — E049 Nontoxic goiter, unspecified: Secondary | ICD-10-CM

## 2017-11-24 LAB — POC INFLUENZA A&B (BINAX/QUICKVUE)
INFLUENZA A, POC: NEGATIVE
INFLUENZA B, POC: NEGATIVE

## 2017-11-24 NOTE — Patient Instructions (Addendum)
After you are  Well.    Send in Take blood pressure readings twice a day for 7- 10 days  .To ensure below 140/90   .Send in readings     We may eed to add medication     Will send referral to   endocrine again   Decrease portion size and  calories beverages ( wine)   Then plan follow up  Sore   Throat  seems viral cause and should runs its course.   Health Maintenance, Female Adopting a healthy lifestyle and getting preventive care can go a long way to promote health and wellness. Talk with your health care provider about what schedule of regular examinations is right for you. This is a good chance for you to check in with your provider about disease prevention and staying healthy. In between checkups, there are plenty of things you can do on your own. Experts have done a lot of research about which lifestyle changes and preventive measures are most likely to keep you healthy. Ask your health care provider for more information. Weight and diet Eat a healthy diet  Be sure to include plenty of vegetables, fruits, low-fat dairy products, and lean protein.  Do not eat a lot of foods high in solid fats, added sugars, or salt.  Get regular exercise. This is one of the most important things you can do for your health. ? Most adults should exercise for at least 150 minutes each week. The exercise should increase your heart rate and make you sweat (moderate-intensity exercise). ? Most adults should also do strengthening exercises at least twice a week. This is in addition to the moderate-intensity exercise.  Maintain a healthy weight  Body mass index (BMI) is a measurement that can be used to identify possible weight problems. It estimates body fat based on height and weight. Your health care provider can help determine your BMI and help you achieve or maintain a healthy weight.  For females 48 years of age and older: ? A BMI below 18.5 is considered underweight. ? A BMI of 18.5 to 24.9 is  normal. ? A BMI of 25 to 29.9 is considered overweight. ? A BMI of 30 and above is considered obese.  Watch levels of cholesterol and blood lipids  You should start having your blood tested for lipids and cholesterol at 54 years of age, then have this test every 5 years.  You may need to have your cholesterol levels checked more often if: ? Your lipid or cholesterol levels are high. ? You are older than 54 years of age. ? You are at high risk for heart disease.  Cancer screening Lung Cancer  Lung cancer screening is recommended for adults 2-37 years old who are at high risk for lung cancer because of a history of smoking.  A yearly low-dose CT scan of the lungs is recommended for people who: ? Currently smoke. ? Have quit within the past 15 years. ? Have at least a 30-pack-year history of smoking. A pack year is smoking an average of one pack of cigarettes a day for 1 year.  Yearly screening should continue until it has been 15 years since you quit.  Yearly screening should stop if you develop a health problem that would prevent you from having lung cancer treatment.  Breast Cancer  Practice breast self-awareness. This means understanding how your breasts normally appear and feel.  It also means doing regular breast self-exams. Let your health care provider know  about any changes, no matter how small.  If you are in your 20s or 30s, you should have a clinical breast exam (CBE) by a health care provider every 1-3 years as part of a regular health exam.  If you are 41 or older, have a CBE every year. Also consider having a breast X-ray (mammogram) every year.  If you have a family history of breast cancer, talk to your health care provider about genetic screening.  If you are at high risk for breast cancer, talk to your health care provider about having an MRI and a mammogram every year.  Breast cancer gene (BRCA) assessment is recommended for women who have family members  with BRCA-related cancers. BRCA-related cancers include: ? Breast. ? Ovarian. ? Tubal. ? Peritoneal cancers.  Results of the assessment will determine the need for genetic counseling and BRCA1 and BRCA2 testing.  Cervical Cancer Your health care provider may recommend that you be screened regularly for cancer of the pelvic organs (ovaries, uterus, and vagina). This screening involves a pelvic examination, including checking for microscopic changes to the surface of your cervix (Pap test). You may be encouraged to have this screening done every 3 years, beginning at age 49.  For women ages 55-65, health care providers may recommend pelvic exams and Pap testing every 3 years, or they may recommend the Pap and pelvic exam, combined with testing for human papilloma virus (HPV), every 5 years. Some types of HPV increase your risk of cervical cancer. Testing for HPV may also be done on women of any age with unclear Pap test results.  Other health care providers may not recommend any screening for nonpregnant women who are considered low risk for pelvic cancer and who do not have symptoms. Ask your health care provider if a screening pelvic exam is right for you.  If you have had past treatment for cervical cancer or a condition that could lead to cancer, you need Pap tests and screening for cancer for at least 20 years after your treatment. If Pap tests have been discontinued, your risk factors (such as having a new sexual partner) need to be reassessed to determine if screening should resume. Some women have medical problems that increase the chance of getting cervical cancer. In these cases, your health care provider may recommend more frequent screening and Pap tests.  Colorectal Cancer  This type of cancer can be detected and often prevented.  Routine colorectal cancer screening usually begins at 54 years of age and continues through 54 years of age.  Your health care provider may recommend  screening at an earlier age if you have risk factors for colon cancer.  Your health care provider may also recommend using home test kits to check for hidden blood in the stool.  A small camera at the end of a tube can be used to examine your colon directly (sigmoidoscopy or colonoscopy). This is done to check for the earliest forms of colorectal cancer.  Routine screening usually begins at age 73.  Direct examination of the colon should be repeated every 5-10 years through 54 years of age. However, you may need to be screened more often if early forms of precancerous polyps or small growths are found.  Skin Cancer  Check your skin from head to toe regularly.  Tell your health care provider about any new moles or changes in moles, especially if there is a change in a mole's shape or color.  Also tell your health care  provider if you have a mole that is larger than the size of a pencil eraser.  Always use sunscreen. Apply sunscreen liberally and repeatedly throughout the day.  Protect yourself by wearing long sleeves, pants, a wide-brimmed hat, and sunglasses whenever you are outside.  Heart disease, diabetes, and high blood pressure  High blood pressure causes heart disease and increases the risk of stroke. High blood pressure is more likely to develop in: ? People who have blood pressure in the high end of the normal range (130-139/85-89 mm Hg). ? People who are overweight or obese. ? People who are African American.  If you are 44-70 years of age, have your blood pressure checked every 3-5 years. If you are 39 years of age or older, have your blood pressure checked every year. You should have your blood pressure measured twice-once when you are at a hospital or clinic, and once when you are not at a hospital or clinic. Record the average of the two measurements. To check your blood pressure when you are not at a hospital or clinic, you can use: ? An automated blood pressure machine at  a pharmacy. ? A home blood pressure monitor.  If you are between 48 years and 41 years old, ask your health care provider if you should take aspirin to prevent strokes.  Have regular diabetes screenings. This involves taking a blood sample to check your fasting blood sugar level. ? If you are at a normal weight and have a low risk for diabetes, have this test once every three years after 54 years of age. ? If you are overweight and have a high risk for diabetes, consider being tested at a younger age or more often. Preventing infection Hepatitis B  If you have a higher risk for hepatitis B, you should be screened for this virus. You are considered at high risk for hepatitis B if: ? You were born in a country where hepatitis B is common. Ask your health care provider which countries are considered high risk. ? Your parents were born in a high-risk country, and you have not been immunized against hepatitis B (hepatitis B vaccine). ? You have HIV or AIDS. ? You use needles to inject street drugs. ? You live with someone who has hepatitis B. ? You have had sex with someone who has hepatitis B. ? You get hemodialysis treatment. ? You take certain medicines for conditions, including cancer, organ transplantation, and autoimmune conditions.  Hepatitis C  Blood testing is recommended for: ? Everyone born from [birth year] through 1967. ? Anyone with known risk factors for hepatitis C.  Sexually transmitted infections (STIs)  You should be screened for sexually transmitted infections (STIs) including gonorrhea and chlamydia if: ? You are sexually active and are younger than 54 years of age. ? You are older than 54 years of age and your health care provider tells you that you are at risk for this type of infection. ? Your sexual activity has changed since you were last screened and you are at an increased risk for chlamydia or gonorrhea. Ask your health care provider if you are at risk.  If you do  not have HIV, but are at risk, it may be recommended that you take a prescription medicine daily to prevent HIV infection. This is called pre-exposure prophylaxis (PrEP). You are considered at risk if: ? You are sexually active and do not regularly use condoms or know the HIV status of your partner(s). ? You take  drugs by injection. ? You are sexually active with a partner who has HIV.  Talk with your health care provider about whether you are at high risk of being infected with HIV. If you choose to begin PrEP, you should first be tested for HIV. You should then be tested every 3 months for as long as you are taking PrEP. Pregnancy  If you are premenopausal and you may become pregnant, ask your health care provider about preconception counseling.  If you may become pregnant, take 400 to 800 micrograms (mcg) of folic acid every day.  If you want to prevent pregnancy, talk to your health care provider about birth control (contraception). Osteoporosis and menopause  Osteoporosis is a disease in which the bones lose minerals and strength with aging. This can result in serious bone fractures. Your risk for osteoporosis can be identified using a bone density scan.  If you are 57 years of age or older, or if you are at risk for osteoporosis and fractures, ask your health care provider if you should be screened.  Ask your health care provider whether you should take a calcium or vitamin D supplement to lower your risk for osteoporosis.  Menopause may have certain physical symptoms and risks.  Hormone replacement therapy may reduce some of these symptoms and risks. Talk to your health care provider about whether hormone replacement therapy is right for you. Follow these instructions at home:  Schedule regular health, dental, and eye exams.  Stay current with your immunizations.  Do not use any tobacco products including cigarettes, chewing tobacco, or electronic cigarettes.  If you are  pregnant, do not drink alcohol.  If you are breastfeeding, limit how much and how often you drink alcohol.  Limit alcohol intake to no more than 1 drink per day for nonpregnant women. One drink equals 12 ounces of beer, 5 ounces of wine, or 1 ounces of hard liquor.  Do not use street drugs.  Do not share needles.  Ask your health care provider for help if you need support or information about quitting drugs.  Tell your health care provider if you often feel depressed.  Tell your health care provider if you have ever been abused or do not feel safe at home. This information is not intended to replace advice given to you by your health care provider. Make sure you discuss any questions you have with your health care provider. Document Released: 02/10/2011 Document Revised: 01/03/2016 Document Reviewed: 05/01/2015 Elsevier Interactive Patient Education  [employer].

## 2017-11-25 LAB — BASIC METABOLIC PANEL
BUN: 8 mg/dL (ref 6–23)
CALCIUM: 9.2 mg/dL (ref 8.4–10.5)
CO2: 24 mEq/L (ref 19–32)
Chloride: 90 mEq/L — ABNORMAL LOW (ref 96–112)
Creatinine, Ser: 0.63 mg/dL (ref 0.40–1.20)
GFR: 104.88 mL/min (ref >60.00)
Glucose, Bld: 89 mg/dL (ref 70–99)
Potassium: 4.3 mEq/L (ref 3.5–5.1)
SODIUM: 123 meq/L — AB (ref 135–145)

## 2017-11-25 LAB — LIPID PANEL
Cholesterol: 230 mg/dL — ABNORMAL HIGH (ref 0–200)
HDL: 116 mg/dL (ref >39.00)
LDL CALC: 100 mg/dL — AB (ref 0–99)
NONHDL: 113.89
Total CHOL/HDL Ratio: 2
Triglycerides: 67 mg/dL (ref 0.0–149.0)
VLDL: 13.4 mg/dL (ref 0.0–40.0)

## 2017-11-25 LAB — CBC WITH DIFFERENTIAL/PLATELET
Basophils Absolute: 0 10*3/uL (ref 0.0–0.1)
Basophils Relative: 0.4 % (ref 0.0–3.0)
EOS ABS: 0.1 10*3/uL (ref 0.0–0.7)
EOS PCT: 2 % (ref 0.0–5.0)
HEMATOCRIT: 36.8 % (ref 36.0–46.0)
Hemoglobin: 12.6 g/dL (ref 12.0–15.0)
LYMPHS PCT: 16.9 % (ref 12.0–46.0)
Lymphs Abs: 0.8 10*3/uL (ref 0.7–4.0)
MCHC: 34.3 g/dL (ref 30.0–36.0)
MCV: 86.5 fl (ref 78.0–100.0)
MONO ABS: 0.7 10*3/uL (ref 0.1–1.0)
Monocytes Relative: 15.3 % — ABNORMAL HIGH (ref 3.0–12.0)
Neutro Abs: 3 10*3/uL (ref 1.4–7.7)
Neutrophils Relative %: 65.4 % (ref 43.0–77.0)
Platelets: 228 10*3/uL (ref 150.0–400.0)
RBC: 4.25 Mil/uL (ref 3.87–5.11)
RDW: 14.6 % (ref 11.5–15.5)
WBC: 4.6 10*3/uL (ref 4.0–10.5)

## 2017-11-25 LAB — HEMOGLOBIN A1C: Hgb A1c MFr Bld: 5.2 % (ref 4.6–6.5)

## 2017-12-02 ENCOUNTER — Other Ambulatory Visit: Payer: Self-pay | Admitting: *Deleted

## 2017-12-02 DIAGNOSIS — E871 Hypo-osmolality and hyponatremia: Secondary | ICD-10-CM

## 2017-12-03 DIAGNOSIS — E871 Hypo-osmolality and hyponatremia: Secondary | ICD-10-CM

## 2017-12-24 NOTE — Telephone Encounter (Signed)
This encounter was created in error - please disregard.

## 2018-01-01 DIAGNOSIS — S0502XA Injury of conjunctiva and corneal abrasion without foreign body, left eye, initial encounter: Secondary | ICD-10-CM | POA: Diagnosis not present

## 2018-01-01 DIAGNOSIS — H1089 Other conjunctivitis: Secondary | ICD-10-CM | POA: Diagnosis not present

## 2018-01-25 ENCOUNTER — Encounter: Payer: Self-pay | Admitting: Internal Medicine

## 2018-01-25 ENCOUNTER — Ambulatory Visit (INDEPENDENT_AMBULATORY_CARE_PROVIDER_SITE_OTHER): Payer: BLUE CROSS/BLUE SHIELD | Admitting: Internal Medicine

## 2018-01-25 VITALS — BP 130/84 | HR 94 | Ht 66.25 in | Wt 184.6 lb

## 2018-01-25 DIAGNOSIS — E0590 Thyrotoxicosis, unspecified without thyrotoxic crisis or storm: Secondary | ICD-10-CM

## 2018-01-25 DIAGNOSIS — E049 Nontoxic goiter, unspecified: Secondary | ICD-10-CM | POA: Diagnosis not present

## 2018-01-25 DIAGNOSIS — E041 Nontoxic single thyroid nodule: Secondary | ICD-10-CM | POA: Diagnosis not present

## 2018-01-25 LAB — T3, FREE: T3, Free: 2.9 pg/mL (ref 2.3–4.2)

## 2018-01-25 LAB — T4, FREE: FREE T4: 0.86 ng/dL (ref 0.60–1.60)

## 2018-01-25 LAB — TSH: TSH: 0.31 u[IU]/mL — ABNORMAL LOW (ref 0.35–4.50)

## 2018-01-25 NOTE — Patient Instructions (Signed)
Please stop at the lab.  Let's plan to check a thyroid uptake and scan when the results are back.  Please come back for a follow-up appointment in 6 months.   Thyroid Nodule A thyroid nodule is an isolatedgrowth of thyroid cells that forms a lump in your thyroid gland. The thyroid gland is a butterfly-shaped gland. It is found in the lower front of your neck. This gland sends chemical messengers (hormones) through your blood to all parts of your body. These hormones are important in regulating your body temperature and helping your body to use energy. Thyroid nodules are common. Most are not cancerous (are benign). You may have one nodule or several nodules. Different types of thyroid nodules include:  Nodules that grow and fill with fluid (thyroid cysts).  Nodules that produce too much thyroid hormone (hot nodules or hyperthyroid).  Nodules that produce no thyroid hormone (cold nodules or hypothyroid).  Nodules that form from cancer cells (thyroid cancers).  What are the causes? Usually, the cause of this condition is not known. What increases the risk? Factors that make this condition more likely to develop include:  Increasing age. Thyroid nodules become more common in people who are older than 54 years of age.  Gender. ? Benign thyroid nodules are more common in women. ? Cancerous (malignant) thyroid nodules are more common in men.  A family history that includes: ? Thyroid nodules. ? Pheochromocytoma. ? Thyroid carcinoma. ? Hyperparathyroidism.  Certain kinds of thyroid diseases, such as Hashimoto thyroiditis.  Lack of iodine.  A history of head and neck radiation, such as from X-rays.  What are the signs or symptoms? It is common for this condition to cause no symptoms. If you have symptoms, they may include:  A lump in your lower neck.  Feeling a lump or tickle in your throat.  Pain in your neck, jaw, or ear.  Having trouble swallowing.  Hot nodules may  cause symptoms that include:  Weight loss.  Warm, flushed skin.  Feeling hot.  Feeling nervous.  A racing heartbeat.  Cold nodules may cause symptoms that include:  Weight gain.  Dry skin.  Brittle hair. This may also occur with hair loss.  Feeling cold.  Fatigue.  Thyroid cancer nodules may cause symptoms that include:  Hard nodules that feel stuck to the thyroid gland.  Hoarseness.  Lumps in the glands near your thyroid (lymph nodes).  How is this diagnosed? A thyroid nodule may be felt by your health care provider during a physical exam. This condition may also be diagnosed based on your symptoms. You may also have tests, including:  An ultrasound. This may be done to confirm the diagnosis.  A biopsy. This involves taking a sample from the nodule and looking at it under a microscope to see if the nodule is benign.  Blood tests to make sure that your thyroid is working properly.  Imaging tests such as MRI or CT scan may be done if: ? Your nodule is large. ? Your nodule is blocking your airway. ? Cancer is suspected.  How is this treated? Treatment depends on the cause and size of your nodule or nodules. If the nodule is benign, treatment may not be necessary. Your health care provider may monitor the nodule to see if it goes away without treatment. If the nodule continues to grow, is cancerous, or does not go away:  It may need to be drained with a needle.  It may need to be removed with  surgery.  If you have surgery, part or all of your thyroid gland may need to be removed as well. Follow these instructions at home:  Pay attention to any changes in your nodule.  Take over-the-counter and prescription medicines only as told by your health care provider.  Keep all follow-up visits as told by your health care provider. This is important. Contact a health care provider if:  Your voice changes.  You have trouble swallowing.  You have pain in your neck,  ear, or jaw that is getting worse.  Your nodule gets bigger.  Your nodule starts to make it harder for you to breathe. Get help right away if:  You have a sudden fever.  You feel very weak.  Your muscles look like they are shrinking (muscle wasting).  You have mood swings.  You feel very restless.  You feel confused.  You are seeing or hearing things that other people do not see or hear (having hallucinations).  You feel suddenly nauseous or throw up.  You suddenly have diarrhea.  You have chest pain.  There is a loss of consciousness. This information is not intended to replace advice given to you by your health care provider. Make sure you discuss any questions you have with your health care provider. Document Released: 06/20/2004 Document Revised: 03/30/2016 Document Reviewed: 11/08/2014 Elsevier Interactive Patient Education  2018 [employer]

## 2018-01-25 NOTE — Progress Notes (Addendum)
Patient ID: Melissa Walters, female   DOB: 1966-01-04, 52 y.o.   MRN: [id number]    HPI  Melissa Walters is a 52 y.o.-year-old female, referred by her PCP, Dr. Fabian SharpPanosh, for evaluation for goiter and a thyroid nodule seen on recent chest CT (to R/o PE).  Her daughter, Melissa Walters, was also my patient (we diagnosed syndrome of resistance to thyroid hormones), before she moved to [place].  Patient was incidentally found to have a goiter and a thyroid nodule on the recent chest CT done to rule out PE.  She was referred to endocrinology for further investigation.  Chest CT (08/17/2017): Hypodense 12 mm right-sided nodule of the mildly enlarged thyroid gland. Coarse calcifications are seen in the lower pole the right thyroid gland. Slight retroclavicular extension of right lobe of the thyroid gland is seen.  Pt denies: - feeling nodules in neck - hoarseness - choking - SOB with lying down  She has: - mild dysphagia - with food - occasionally  She also describe thick secretions and coughing after meals.  She had the same problems even on the Whole 30 diet or after she tried to eliminate dairy.   Pt also has a h/o intermittent subclinical thyrotoxicosis, with most recent TSH normal.  I reviewed pt's thyroid tests: Lab Results  Component Value Date   TSH 0.96 08/21/2017   TSH 0.248 (L) 08/17/2017   TSH 0.36 10/31/2016   TSH 0.94 10/25/2014   TSH 0.29 (L) 12/15/2013   TSH 0.41 09/29/2012   TSH 0.28 (L) 09/16/2012   TSH 1.00 09/02/2011   TSH 0.63 10/10/2009   TSH 0.59 09/28/2007   FREET4 0.86 08/21/2017   FREET4 0.88 08/17/2017   FREET4 0.93 10/25/2014   FREET4 0.79 09/29/2012   FREET4 0.9 09/28/2007   FREET4 0.9 10/21/2006    Lab Results  Component Value Date   T3FREE 2.9 08/21/2017   T3FREE 3.6 10/25/2014   T3FREE 2.2 (L) 09/29/2012   Normal thyroid Ab's: Component     Latest Ref Rng & Units 08/21/2017  Thyroglobulin Ab     < or = 1 IU/mL <1  Thyroperoxidase Ab  SerPl-aCnc     <9 IU/mL <1   Pt had: - palpitations in 08/2017 after an episode of walking PNA >> resolved  But denies: - fatigue - heat intolerance/cold intolerance - tremors - palpitations - anxiety/depression - hyperdefecation/constipation - unintentional weight loss  - dry skin - hair loss  + FH of thyroid ds. - daughters No FH of thyroid cancer. No h/o radiation tx to head or neck.  No seaweed or kelp. No recent contrast studies. No steroid use. No herbal supplements. No Biotin supplements or Hair, Skin and Nails vitamins.  Pt also has a history of bipolar, psoriasis and psoriatic arthriti, + HTN, anemia, GERD. + FH of autoimmune ds..  ROS: Constitutional: + Fatigue, + weight gain, + poor sleep, + nocturia, no heat or cold intolerance Eyes: + Blurry vision, no xerophthalmia ENT: no sore throat, no nodules palpated in throat, no dysphagia/odynophagia, no hoarseness Cardiovascular:+ CP/+ SOB/no palpitations/leg swelling Respiratory: + cough/+ SOB/+ wheezing Gastrointestinal: + N/+ V/no D/ +C Musculoskeletal: no muscle/+ joint swelling and aches Skin: no rashes, + easy bruising, + hair loss Neurological: no tremors/numbness/tingling/dizziness Psychiatric: + both: depression/anxiety + Low libido  Past Medical History:  Diagnosis Date  . ADD (attention deficit disorder)    poss  . Depression   . Mood disorder (HCC)   . Psoriasis   . Psoriatic  arthritis (HCC) 10/2012   Past Surgical History:  Procedure Laterality Date  . BUNIONECTOMY     bone spur left foot  . ENDOMETRIAL ABLATION     Social History   Socioeconomic History  . Marital status: Married    Spouse name: Not on file  . Number of children: 4  . Years of education: Not on file  . Highest education level: Not on file  Occupational History  .  Interior and spatial designer of senior [employer]  Tobacco Use  . Smoking status: Never Smoker  . Smokeless tobacco: Never Used  Substance and Sexual  Activity  . Alcohol use: Yes: 2-3 drinks 3 times a week, wine    Comment: Socially   . Drug use: No  Social History Narrative    Married   No caffeine   Managing HH   Of 4    Pet dog   Regular exercise- yes  Not much in winter    BA degree      Current Outpatient Medications on File Prior to Visit  Medication Sig Dispense Refill  . aspirin EC 81 MG tablet Take 81 mg by mouth daily.    . carbamazepine (TEGRETOL XR) 200 MG 12 hr tablet Take 3 tablets by mouth daily.  3  . lamoTRIgine (LAMICTAL) 100 MG tablet Take 200 mg by mouth daily.    [name] lamoTRIgine (LAMICTAL) 200 MG tablet Take 2 tablets by mouth daily.  4  . losartan (COZAAR) 100 MG tablet Take 1 tablet (100 mg total) by mouth daily. 30 tablet 11  . traZODone (DESYREL) 50 MG tablet Take 50-150 mg by mouth at bedtime as needed for sleep.   3  . zolpidem (AMBIEN) 5 MG tablet Take 1 tablet (5 mg total) by mouth at bedtime as needed for sleep. 15 tablet 0   No current facility-administered medications on file prior to visit.    Allergies  Allergen Reactions  . Compazine Swelling    Throat swelling   Family History  Problem Relation Age of Onset  . Anxiety disorder Unknown   . Diabetes Father   . Glaucoma Father   . Myasthenia gravis Mother   . Other Mother        PA  . Other Brother        devic disease NMO neuromyelitis optica  . Hashimoto's thyroiditis Daughter   . Psoriasis Sister   . Depression Sister   . Arthritis Unknown        parents     PE: BP 130/84   Pulse 94   Ht 5' 6.25" (1.683 m)   Wt 184 lb 9.6 oz (83.7 kg)   SpO2 97%   BMI 29.57 kg/m  Wt Readings from Last 3 Encounters:  01/25/18 184 lb 9.6 oz (83.7 kg)  11/24/17 183 lb 9.6 oz (83.3 kg)  09/25/17 192 lb 3.2 oz (87.2 kg)   Constitutional: overweight, in NAD Eyes: PERRLA, EOMI, no exophthalmos ENT: moist mucous membranes, no thyromegaly, no cervical lymphadenopathy Cardiovascular: tachycardia, RR, No MRG Respiratory: CTA  B Gastrointestinal: abdomen soft, NT, ND, BS+ Musculoskeletal: no deformities, strength intact in all 4;  Skin: moist, warm, no rashes Neurological: no tremor with outstretched hands, DTR normal in all 4  ASSESSMENT: 1. Goiter  2. Thyroid nodule  3. Subclinical thyrotoxicosis  PLAN: 1. Goiter and 2. thyroid nodule - I reviewed the CT images of her thyroid ultrasound.  Her thyroid gland appears mildly enlarged and does have some calcifications.  The thyroid nodule (1.2 cm per CT report) is not very well characterized.  I explained that the CT scan is not the best to evaluate thyroid nodules and we may need to get a thyroid ultrasound.  However, since she has a history of subclinical thyrotoxicosis, I would like to start by doing a thyroid uptake and scan.  I explained that:  if this nodule is hyperfunctioning (warm or hot), the chance of being cancerous is very low, almost 0.  Therefore, in that case, we would not need a biopsy.    If the nodule is cold, we will follow this with a thyroid ultrasound.  We may need a thyroid ultrasound also to investigate the size of her thyroid, which is not as well characterized on the CT scan. Pt does not have a thyroid cancer family history or a personal history of RxTx to head/neck. All these would favor benignity.  - We discussed about the possibility of needing to do a thyroid biopsy (FNA). - she has no neck compression symptoms, with the exception of thick secretions which may come from undiagnosed sinus disease.  This may also lead to dysphagia.  I explained that the right thyroid nodule is in the right thyroid lobe, so far away from her esophagus, therefore, it is unlikely that the nodule is compressing this structure.  However, since she has a larger thyroid, the goiter itself may press on the esophagus.  We may need a barium swallow to further delineate the etiology of her dysphagia.  However, I think that this may be related to postnasal drip/thick  secretions,  since she also has nausea and vomiting. - I will see her back in 6 months.  3. Subclinical thyrotoxicosis - this is mild and fluctuating - She had palpitations in 08/2017, but they resolved. - Her pulse is not high at home, although here, she is tachycardic.  She has a fit bit and I advised her to take her pulse at home and let me know if her pulse is higher than 90, in which case we will need to start beta-blockers - last set of TFTs normal 08/21/2017 - will obtain an uptake and scan  Component     Latest Ref Rng & Units 01/25/2018  TSH     0.35 - 4.50 uIU/mL 0.31 (L)  T4,Free(Direct)     0.60 - 1.60 ng/dL 1.61  Triiodothyronine,Free,Serum     2.3 - 4.2 pg/mL 2.9  TSI     <140 % baseline <89   TSIs not elevated, TSH only slightly low, with normal free thyroid hormones. No intervention needed for now.  Uptake and scan: Reading Physician Reading Date Result Priority  Ulyses Southward, MD 04/08/2018     Narrative    CLINICAL DATA: Subclinical hyperthyroidism and thyrotoxicosis, lethargy, nervousness, sore throat, temperature intolerance, tachycardia, trouble swallowing, tremors, diaphoresis  EXAM: THYROID SCAN AND UPTAKE - 4 AND 24 HOURS  TECHNIQUE: Following oral administration of I-123 capsule, anterior planar imaging was acquired at 24 hours. Thyroid uptake was calculated with a thyroid probe at 4-6 hours and 24 hours.  RADIOPHARMACEUTICALS: 437 uCi I-123 sodium iodide p.o.  COMPARISON: None.  FINDINGS: Homogeneous tracer distribution in both thyroid lobes.  No focal areas of increased or decreased tracer localization seen.  6 hour I-123 uptake = 15.2% (normal 5-20%)  24 hour I-123 uptake = 29.8% (normal 10-30%)  IMPRESSION: Normal exam.   Electronically Signed By: Ulyses Southward M.D. On: 04/08/2018 14:00   Normal uptake and scan.  As discussed above, the next step would be a thyroid ultrasound.  I will order this.  Carlus Pavlov, MD  PhD [facility] Endocrin[facility]

## 2018-01-27 LAB — THYROID STIMULATING IMMUNOGLOBULIN

## 2018-02-14 DIAGNOSIS — S52125A Nondisplaced fracture of head of left radius, initial encounter for closed fracture: Secondary | ICD-10-CM | POA: Diagnosis not present

## 2018-02-15 ENCOUNTER — Encounter: Payer: Self-pay | Admitting: Internal Medicine

## 2018-02-16 ENCOUNTER — Ambulatory Visit (INDEPENDENT_AMBULATORY_CARE_PROVIDER_SITE_OTHER): Payer: BLUE CROSS/BLUE SHIELD | Admitting: Sports Medicine

## 2018-02-16 ENCOUNTER — Ambulatory Visit: Payer: Self-pay

## 2018-02-16 ENCOUNTER — Encounter: Payer: Self-pay | Admitting: Sports Medicine

## 2018-02-16 VITALS — BP 150/90 | HR 79 | Ht 66.25 in | Wt 184.2 lb

## 2018-02-16 DIAGNOSIS — M25522 Pain in left elbow: Secondary | ICD-10-CM

## 2018-02-16 DIAGNOSIS — S52125A Nondisplaced fracture of head of left radius, initial encounter for closed fracture: Secondary | ICD-10-CM | POA: Diagnosis not present

## 2018-02-16 MED ORDER — IBUPROFEN 800 MG PO TABS: 800.0000 mg | ORAL_TABLET | Freq: Three times a day (TID) | ORAL | 1 refills | Status: DC | PRN

## 2018-02-16 NOTE — Progress Notes (Signed)
Melissa FellsMichael D. Delorise Shinerigby, DO  [facility] [place] [phone]  Melissa CoombsMichele F Walters - 52 y.o. female MRN [id number]  Date of birth: November 26, 1965  Visit Date: 02/16/2018  PCP: Melissa Walters, Melissa K, MD   Referred by: Melissa Walters, Melissa K, MD  Scribe(s) for today's visit: Melissa Walters, CMA  SUBJECTIVE:  Melissa Walters is here for Initial Assessment (elbow pain)   Her L elbow pain symptoms INITIALLY: Began Sunday after falling in her driveway.  Described as mild aching, radiating to the L hand. Pain is severe with movement.  Worsened with extension, movement. Improved with rest, wearing sling.  Additional associated symptoms include: She has noticed some swelling in her arm and hand since the fall.     At this time symptoms are worsening compared to onset, she accidentally hit her forearm on a railing in her garage.  She has been wearing a sling daily. She has been taking tylenol and Motrin prn with some relief. She has also been icing the area with short term relief.    REVIEW OF SYSTEMS: Reports night time disturbances. Denies fevers, chills, or night sweats. Denies unexplained weight loss. Reports personal history of cancer, skin. Denies changes in bowel or bladder habits. Reports recent unreported falls. Denies new or worsening dyspnea or wheezing. Reports headaches.  Reports numbness, tingling or weakness in L hand - improving.  Denies dizziness or presyncopal episodes Denies lower extremity edema    HISTORY & PERTINENT PRIOR DATA:  Prior History reviewed and updated per electronic medical record.  Significant/pertinent history, findings, studies include:  reports that she has never smoked. She has never used smokeless tobacco. Recent Labs    11/24/17 1604  HGBA1C 5.2   No specialty comments available. Problem  Closed Nondisplaced Fracture of Head of Left Radius    OBJECTIVE:  VS:  HT:5' 6.25" (168.3 cm)   WT:184 lb 3.2 oz (83.6 kg)   BMI:29.5    BP:(Abnormal) 150/90  HR:79bpm  TEMP: ( )  RESP:95 %   PHYSICAL EXAM: Constitutional: WDWN, Non-toxic appearing. Psychiatric: Alert & appropriately interactive.  Not depressed or anxious appearing. Respiratory: No increased work of breathing.  Trachea Midline Eyes: Pupils are equal.  EOM intact without nystagmus.  No scleral icterus  Vascular Exam: warm to touch no edema  upper extremity neuro exam: unremarkable normal strength normal sensation  MSK Exam: Left elbow has a small amount of swelling that is even above the elbow.  No significant swelling in the hand although her rings are tight.  Recommended removing these.  Wrist extension strength is normal.  She has most focal pain with palpation of the radial head as well as with supination pronation.  Passive elbow flexion and extension is lacking and 10 to 15 degrees in both directions.   Outside x-rays reviewed today that are overall well aligned.  There is a possible nondisplaced radial head fracture.  ASSESSMENT & PLAN:   1. Left elbow pain   2. Closed nondisplaced fracture of head of left radius, initial encounter     PLAN: Exam findings consistent with nondisplaced radial head fracture.  Avoid exacerbating activities.  Rest, compression, arm sling and ibuprofen recommended   Follow-up: Return in about 10 days (around 02/26/2018).        Please see additional documentation for Objective, Assessment and Plan sections. Pertinent additional documentation may be included in corresponding procedure notes, imaging studies, problem based documentation and patient instructions. Please see these sections of the encounter  for additional information regarding this visit.  CMA/ATC served as Education administrator during this visit. History, Physical, and Plan performed by medical provider. Documentation and orders reviewed and attested to.      Gerda Diss, [place] [facility] Physician

## 2018-02-25 ENCOUNTER — Encounter: Payer: Self-pay | Admitting: Sports Medicine

## 2018-02-25 ENCOUNTER — Ambulatory Visit (INDEPENDENT_AMBULATORY_CARE_PROVIDER_SITE_OTHER): Payer: BLUE CROSS/BLUE SHIELD

## 2018-02-25 ENCOUNTER — Ambulatory Visit (INDEPENDENT_AMBULATORY_CARE_PROVIDER_SITE_OTHER): Payer: BLUE CROSS/BLUE SHIELD | Admitting: Sports Medicine

## 2018-02-25 VITALS — BP 136/84 | HR 85 | Ht 66.25 in | Wt 183.4 lb

## 2018-02-25 DIAGNOSIS — M25522 Pain in left elbow: Secondary | ICD-10-CM | POA: Diagnosis not present

## 2018-02-25 DIAGNOSIS — S52125A Nondisplaced fracture of head of left radius, initial encounter for closed fracture: Secondary | ICD-10-CM | POA: Diagnosis not present

## 2018-02-25 NOTE — Progress Notes (Signed)
Melissa FellsMichael D. Delorise Shinerigby, DO  [facility] [phone]  Melissa CoombsMichele F Walters - 52 y.o. female MRN [id number]  Date of birth: 1965-11-02  Visit Date: 02/25/2018  PCP: Melissa Walters   Referred by: Melissa Walters  Scribe(s) for today's visit: Melissa FabianMolly Walters, LAT, ATC  SUBJECTIVE:  Melissa Walters is here for Follow-up (L elbow pain and L radius fx) .    Her L elbow pain symptoms INITIALLY: Began Sunday after falling in her driveway.  Described as mild aching, radiating to the L hand. Pain is severe with movement.  Worsened with extension, movement. Improved with rest, wearing sling.  Additional associated symptoms include: She has noticed some swelling in her arm and hand since the fall.     At this time symptoms are worsening compared to onset, she accidentally hit her forearm on a railing in her garage.  She has been wearing a sling daily. She has been taking tylenol and Motrin prn with some relief. She has also been icing the area with short term relief.   02/25/2018: Compared to the last office visit on 02/16/18, her previously described L elbow pain symptoms are improving w/ slightly improved pain and improved L elbow ROM. Current symptoms are mild, aching pain & are radiating to the L forearm. She has been using a sling, taking Tylenol and Motrin, icing and compressing her L elbow/forearm.   REVIEW OF SYSTEMS: Denies night time disturbances. Denies fevers, chills, or night sweats. Denies unexplained weight loss. Reports personal history of cancer.  Skin cancer Denies changes in bowel or bladder habits. Reports recent unreported falls. Denies new or worsening dyspnea or wheezing. Denies headaches or dizziness.  Denies numbness, tingling or weakness  In the extremities.  Denies dizziness or presyncopal episodes Denies lower extremity edema    HISTORY & PERTINENT PRIOR DATA:  Prior History reviewed and updated per  electronic medical record.  Significant/pertinent history, findings, studies include:  reports that she has never smoked. She has never used smokeless tobacco. Recent Labs    11/24/17 1604  HGBA1C 5.2   No specialty comments available. No problems updated.  OBJECTIVE:  VS:  HT:5' 6.25" (168.3 cm)   WT:183 lb 6.4 oz (83.2 kg)  BMI:29.37    BP:136/84  HR:85bpm  TEMP: ( )  RESP:95 %   PHYSICAL EXAM: Constitutional: WDWN, Non-toxic appearing. Psychiatric: Alert & appropriately interactive.  Not depressed or anxious appearing. Respiratory: No increased work of breathing.  Trachea Midline Eyes: Pupils are equal.  EOM intact without nystagmus.  No scleral icterus  Vascular Exam: warm to touch  upper extremity neuro exam: unremarkable normal strength  MSK Exam: Moderate pain directly over the radial head.  She has a 10 degree extensor lag.  Flexor and extensor mechanism is intact.  Supination and pronation is painful but strength is normal.  Radial pulses intact.  Moderate swelling edema of the entire elbow.  X-rays reviewed by myself today that do show anterior and posterior fat-pad signs.  Overall there is likely nondisplaced radial neck fracture.   ASSESSMENT & PLAN:   1. Left elbow pain     PLAN: X-rays are stable.  She does have an anterior and posterior fat pad sign.  Is likely related to the radial head/neck fracture given this is the most of tenderness.  Her range of motion and pain have improved.  We will have her continue with arm sling immobilizer and compression as well as  anti-inflammatories and Tylenol and ice and avoidance of any type of exacerbating activity including lifting.  Continue with arm sling as needed.  Follow-up in 2 weeks for repeat clinical exam and if any persistent or worsening symptoms can consider repeat x-rays at that time.  Follow-up: Return in about 2 weeks (around 03/11/2018).      Please see additional documentation for Objective,  Assessment and Plan sections. Pertinent additional documentation may be included in corresponding procedure notes, imaging studies, problem based documentation and patient instructions. Please see these sections of the encounter for additional information regarding this visit.  CMA/ATC served as Neurosurgeon during this visit. History, Physical, and Plan performed by medical provider. Documentation and orders reviewed and attested to.      Melissa Mews, DO    [place] [facility] Physician

## 2018-02-25 NOTE — Patient Instructions (Signed)
I recommend you obtained a compression sleeve to help with your joint problems. There are many options on the market however I recommend obtaining a elbow Body Helix compression sleeve.  You can find information (including how to appropriate measure yourself for sizing) can be found at www.Body Helix.com.  Many of these products are health savings account (HSA) eligible.   You can use the compression sleeve at any time throughout the day but is most important to use while being active as well as for 2 hours post-activity.   It is appropriate to ice following activity with the compression sleeve in place.   

## 2018-03-11 ENCOUNTER — Ambulatory Visit (INDEPENDENT_AMBULATORY_CARE_PROVIDER_SITE_OTHER): Payer: BLUE CROSS/BLUE SHIELD | Admitting: Sports Medicine

## 2018-03-11 ENCOUNTER — Encounter: Payer: Self-pay | Admitting: Sports Medicine

## 2018-03-11 VITALS — BP 132/88 | HR 75 | Ht 66.25 in | Wt 182.2 lb

## 2018-03-11 DIAGNOSIS — M25522 Pain in left elbow: Secondary | ICD-10-CM | POA: Diagnosis not present

## 2018-03-11 DIAGNOSIS — S52125D Nondisplaced fracture of head of left radius, subsequent encounter for closed fracture with routine healing: Secondary | ICD-10-CM

## 2018-03-11 DIAGNOSIS — M9907 Segmental and somatic dysfunction of upper extremity: Secondary | ICD-10-CM

## 2018-03-11 NOTE — Progress Notes (Signed)
Melissa Walters. [facility] at [facility] [phone]  Melissa Walters - 52 y.o. female MRN [id number]  Date of birth: 09/08/1965  Visit Date: 03/11/2018  PCP: Melissa Headings, MD   Referred by: Melissa Headings, MD  Scribe(s) for today's visit: Christoper Fabian, LAT, ATC  SUBJECTIVE:  Melissa Walters is here for Follow-up (L elbow pain - radial head fx) .    Her L elbow pain symptoms INITIALLY: Began Sunday after falling in her driveway.  Described as mild aching, radiating to the L hand. Pain is severe with movement.  Worsened with extension, movement. Improved with rest, wearing sling.  Additional associated symptoms include: She has noticed some swelling in her arm and hand since the fall.     At this time symptoms are worsening compared to onset, she accidentally hit her forearm on a railing in her garage.  She has been wearing a sling daily. She has been taking tylenol and Motrin prn with some relief. She has also been icing the area with short term relief.   02/25/2018: Compared to the last office visit on 02/16/18, her previously described L elbow pain symptoms are improving w/ slightly improved pain and improved L elbow ROM. Current symptoms are mild, aching pain & are radiating to the L forearm. She has been using a sling, taking Tylenol and Motrin, icing and compressing her L elbow/forearm.  03/11/2018: Compared to the last office visit on 02/25/18, her previously described L elbow pain symptoms are improving. Current symptoms are mild & are radiating to L forearm occasionally. She has been wearing a L elbow compression sleeve.  She ices her elbow prn.  She states that she is no longer using Tylenol or Motrin.  L elbow XR - 02/25/18   REVIEW OF SYSTEMS: Denies night time disturbances. Denies fevers, chills, or night sweats. Denies unexplained weight loss. Reports personal history of cancer. Hx of skin cancer  Denies changes  in bowel or bladder habits. Denies recent unreported falls. Denies new or worsening dyspnea or wheezing. Denies headaches or dizziness.  Denies numbness, tingling or weakness  In the extremities.  Denies dizziness or presyncopal episodes Denies lower extremity edema    HISTORY & PERTINENT PRIOR DATA:  Prior History reviewed and updated per electronic medical record.  Significant/pertinent history, findings, studies include:  reports that she has never smoked. She has never used smokeless tobacco. Recent Labs    11/24/17 1604  HGBA1C 5.2   No specialty comments available. No problems updated.  OBJECTIVE:  VS:  HT:5' 6.25" (168.3 cm)   WT:182 lb 3.2 oz (82.6 kg)  BMI:29.18    BP:132/88  HR:75bpm  TEMP: ( )  RESP:95 %   PHYSICAL EXAM: Constitutional: WDWN, Non-toxic appearing. Psychiatric: Alert & appropriately interactive.  Not depressed or anxious appearing. Respiratory: No increased work of breathing.  Trachea Midline Eyes: Pupils are equal.  EOM intact without nystagmus.  No scleral icterus  Vascular Exam: warm to touch no edema  upper extremity neuro exam: unremarkable normal strength normal sensation  MSK Exam: Elbow has improved range of motion.  She has reduced swelling.  Still focal tenderness over the radial head.  She has limited supination by approximately 20 degrees on the left.  Elbow flexion and extension are markedly improved but she still lacks 10 degrees of terminal extension and 20 degrees of terminal flexion.   PROCEDURES & DATA REVIEWED:  Gentle OMT performed to the interosseous membrane  today provided significant improvement in the forearm discomfort that she is experiencing.  Improved range of motion following manipulation.  ASSESSMENT & PLAN:   1. Closed nondisplaced fracture of head of left radius with routine healing, subsequent encounter   2. Left elbow pain   3. Somatic dysfunction of upper extremity     PLAN: Osteopathic  manipulation was performed today based on physical exam findings.  Please see procedure note for further information including Osteopathic Exam findings  Fracture is healing well.  We will follow-up with her in 3 additional weeks and have her begin working on increasing her activities as tolerated after the next 2 weeks.  Avoid any exacerbating or recurrent injury.   Follow-up: Return in about 3 weeks (around 04/01/2018) for repeat x-rays.      Please see additional documentation for Objective, Assessment and Plan sections. Pertinent additional documentation may be included in corresponding procedure notes, imaging studies, problem based documentation and patient instructions. Please see these sections of the encounter for additional information regarding this visit.  CMA/ATC served as Neurosurgeonscribe during this visit. History, Physical, and Plan performed by medical provider. Documentation and orders reviewed and attested to.      Andrena MewsMichael D Yanni Quiroa, DO     Sports Medicine Physician

## 2018-03-11 NOTE — Progress Notes (Signed)
PROCEDURE NOTE : OSTEOPATHIC MANIPULATION The decision today to treat with Osteopathic Manipulative Therapy (OMT) was based on physical exam findings. Verbal consent was obtained following a discussion with the patient regarding the of risks, benefits and potential side effects, including an acute pain flare, post manipulation soreness and need for repeat treatments.     Contraindications to OMT: NONE  Manipulation was performed as below: Regions treated: Upper extremities OMT Techniques Used: HVLA, muscle energy and myofascial release  The patient tolerated the treatment well and reported Improved symptoms following treatment today. Patient was given medications, exercises, stretches and lifestyle modifications per AVS and verbally.   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

## 2018-03-17 ENCOUNTER — Encounter: Payer: Self-pay | Admitting: Gastroenterology

## 2018-03-18 DIAGNOSIS — R1310 Dysphagia, unspecified: Secondary | ICD-10-CM | POA: Diagnosis not present

## 2018-03-18 DIAGNOSIS — Z1211 Encounter for screening for malignant neoplasm of colon: Secondary | ICD-10-CM | POA: Diagnosis not present

## 2018-03-18 DIAGNOSIS — Z86010 Personal history of colonic polyps: Secondary | ICD-10-CM | POA: Diagnosis not present

## 2018-03-18 DIAGNOSIS — K219 Gastro-esophageal reflux disease without esophagitis: Secondary | ICD-10-CM | POA: Diagnosis not present

## 2018-03-19 ENCOUNTER — Telehealth: Payer: Self-pay

## 2018-03-19 NOTE — Telephone Encounter (Signed)
Per [employer] no pre-cert is needed REF: [id number].   [facility] 9am return at 1pm 04/08/18 9am  Had to LVM for pt and sent my chart message, gave number incase she needs to reschedule.

## 2018-03-25 IMAGING — DX DG CHEST 2V
2 series · 2 of 2 positions shown · non-contrast
Comparison: None.

CLINICAL DATA: Chest pain and shortness of breath for 2 months.

EXAM:
CHEST  2 VIEW

[chest pa]
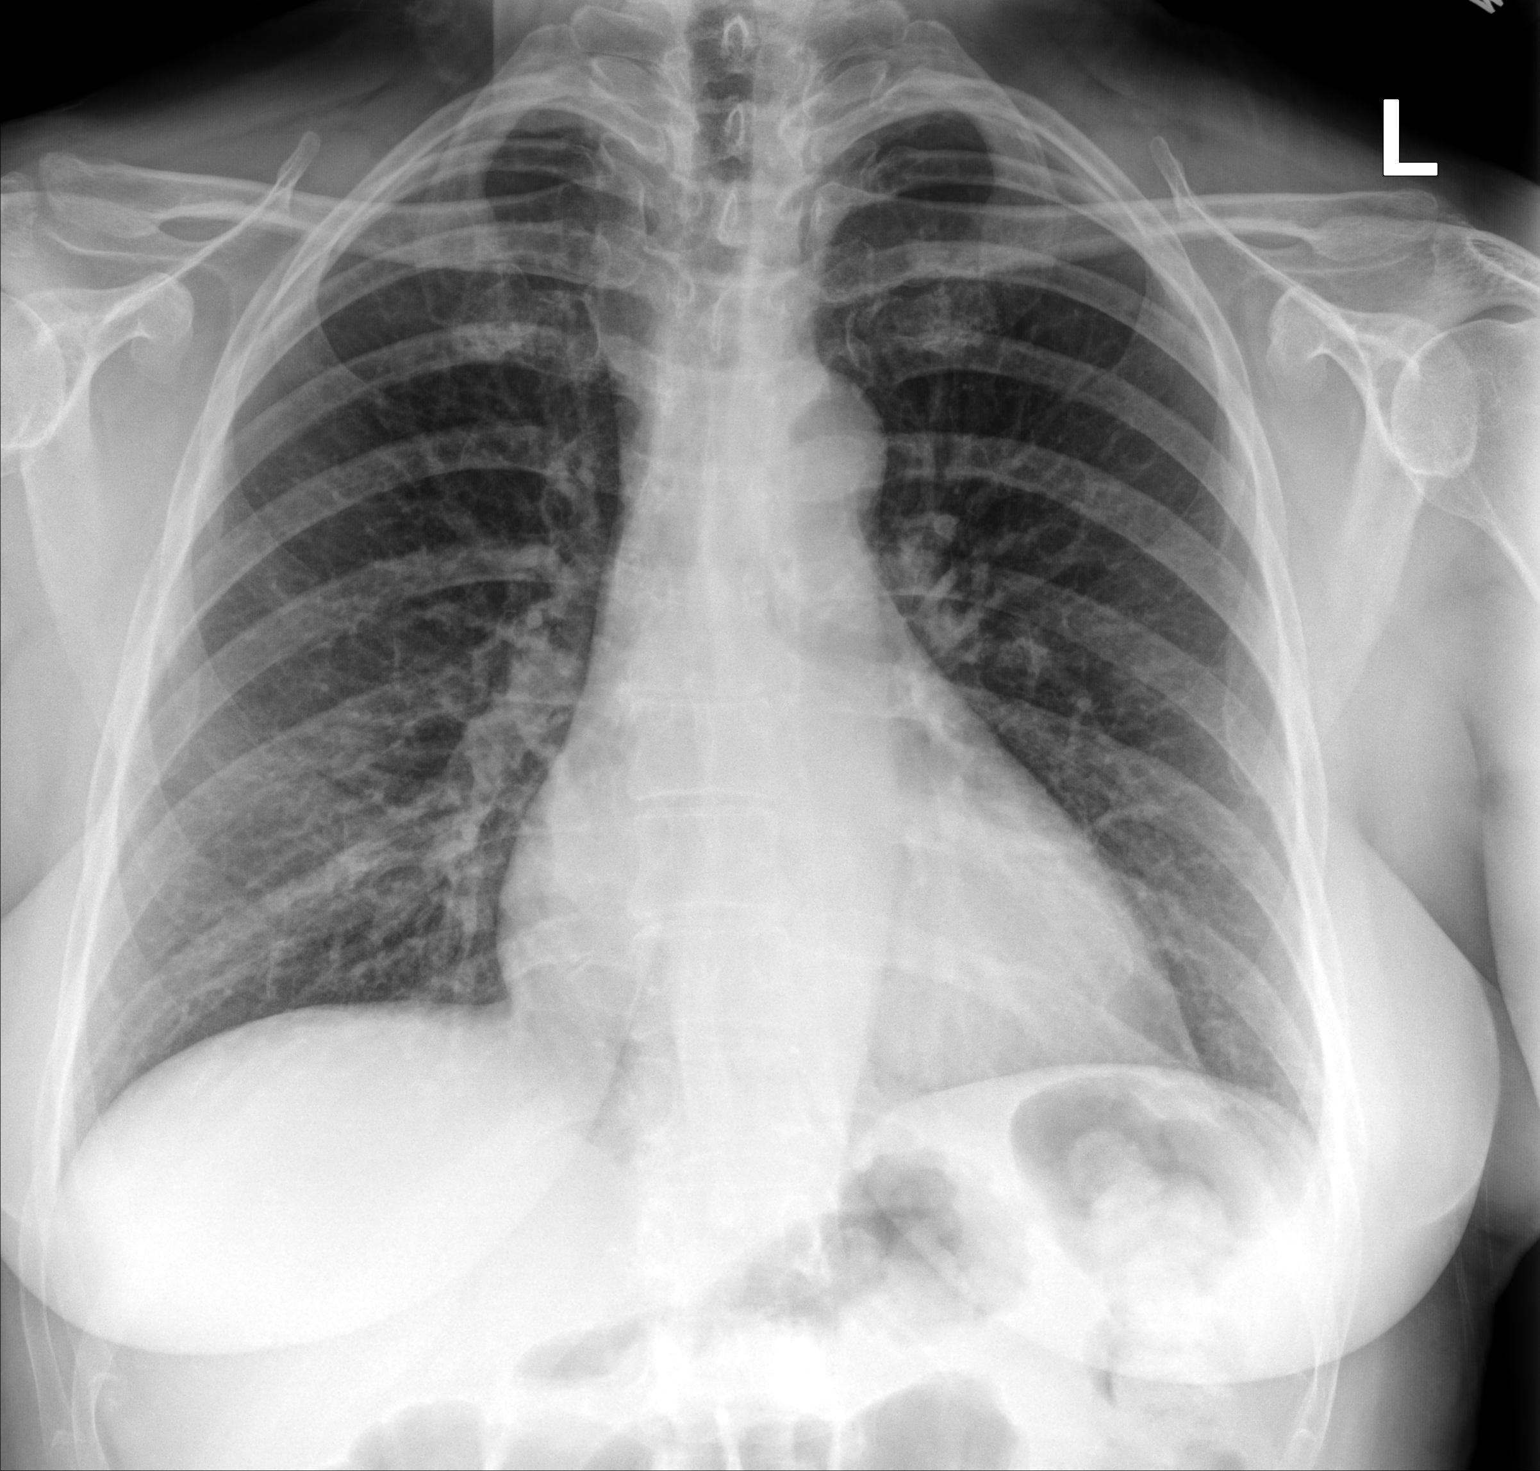

[chest lat]
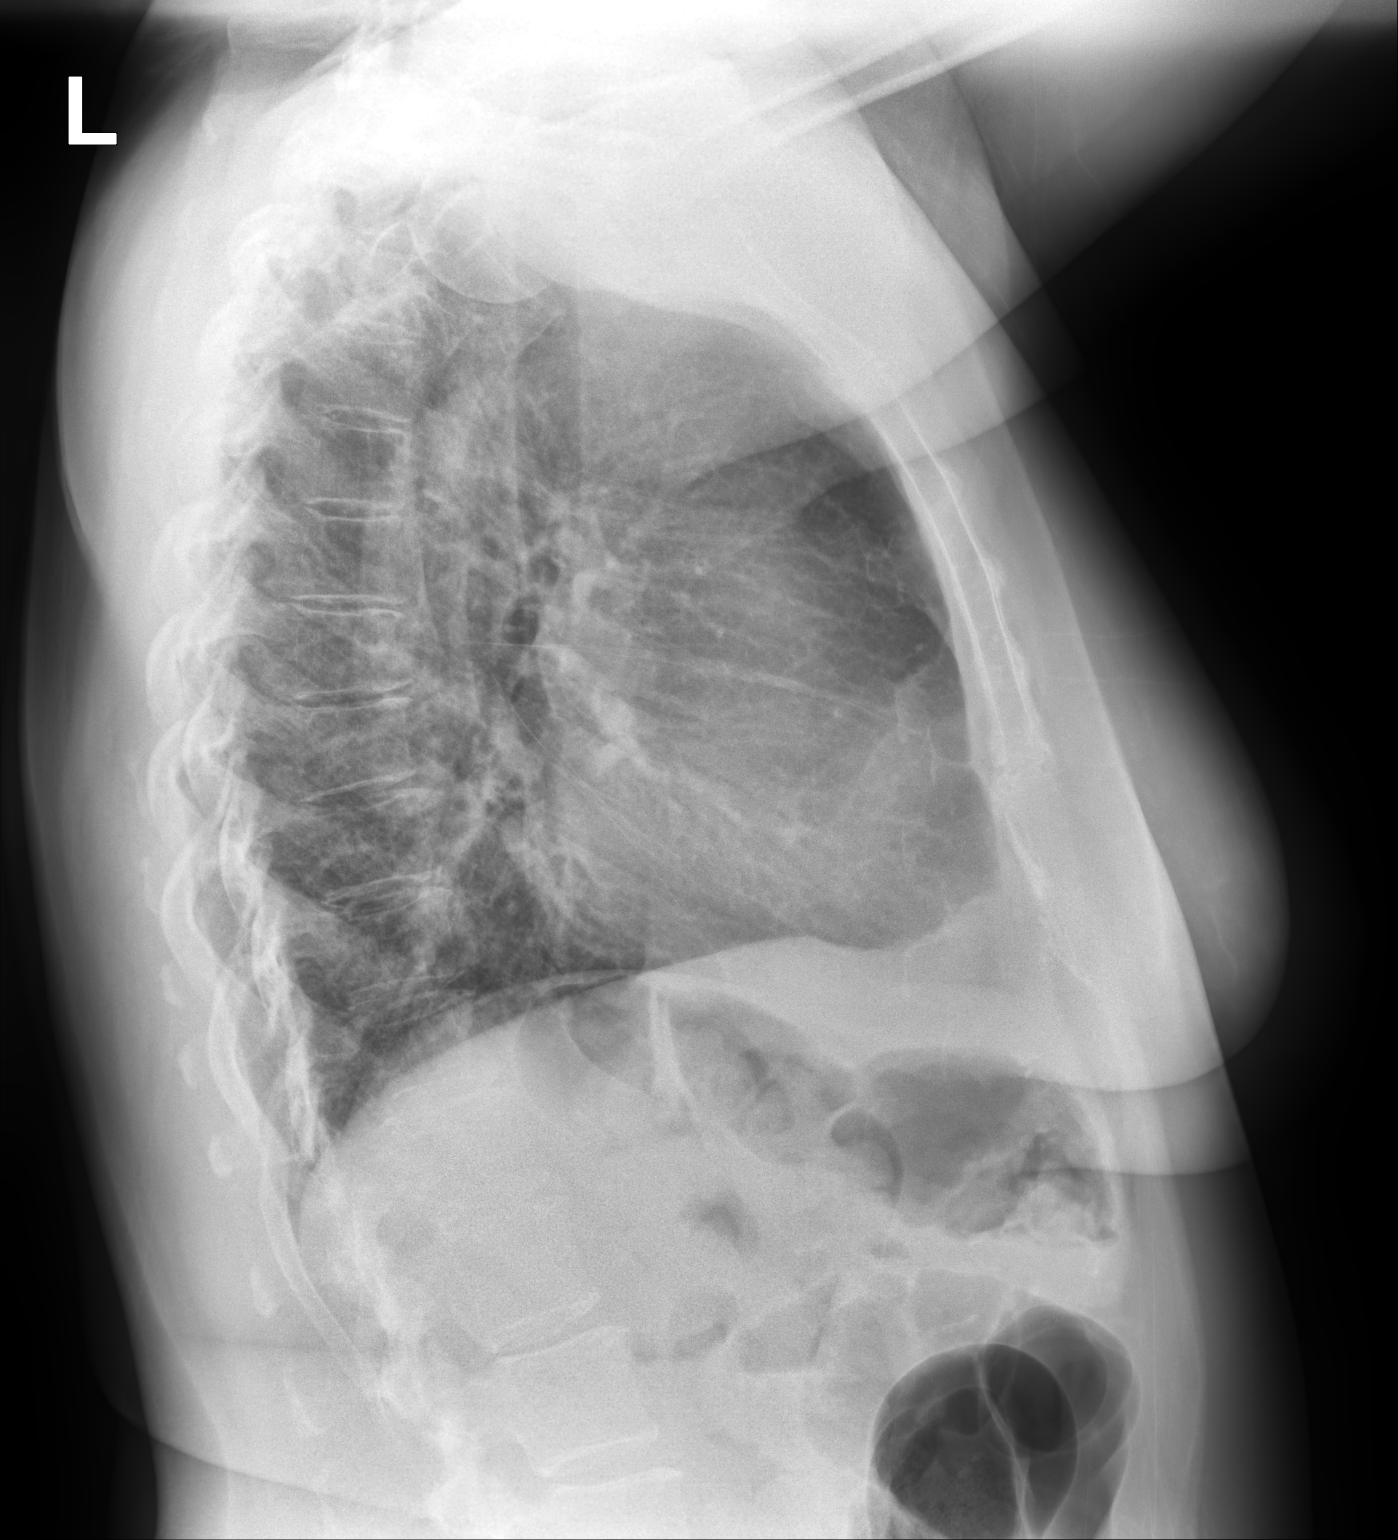

[2 of 2 positions shown; findings below may reference images not displayed]

FINDINGS: The cardiac silhouette, mediastinal and hilar contours are within
normal limits. There are bronchitic type lung changes which could
suggest bronchitis or smoking changes. No infiltrates or effusions.
No worrisome pulmonary lesions. The bony thorax is intact.
IMPRESSION: Bronchitic lung changes could suggest bronchitis or smoking changes.
No infiltrates or effusions.

## 2018-04-01 ENCOUNTER — Ambulatory Visit: Payer: BLUE CROSS/BLUE SHIELD | Admitting: Sports Medicine

## 2018-04-02 ENCOUNTER — Ambulatory Visit: Payer: BLUE CROSS/BLUE SHIELD | Admitting: Sports Medicine

## 2018-04-02 DIAGNOSIS — Z0289 Encounter for other administrative examinations: Secondary | ICD-10-CM

## 2018-04-07 ENCOUNTER — Encounter (HOSPITAL_COMMUNITY)
Admission: RE | Admit: 2018-04-07 | Discharge: 2018-04-07 | Disposition: A | Discharge status: Home / Self Care | Payer: BLUE CROSS/BLUE SHIELD | Source: Ambulatory Visit | Attending: Internal Medicine | Admitting: Internal Medicine

## 2018-04-07 ENCOUNTER — Ambulatory Visit: Payer: BLUE CROSS/BLUE SHIELD | Admitting: Sports Medicine

## 2018-04-07 DIAGNOSIS — Z0289 Encounter for other administrative examinations: Secondary | ICD-10-CM

## 2018-04-07 DIAGNOSIS — E0590 Thyrotoxicosis, unspecified without thyrotoxic crisis or storm: Secondary | ICD-10-CM | POA: Insufficient documentation

## 2018-04-07 MED ORDER — SODIUM IODIDE I-123 7.4 MBQ CAPS
437.0000 | ORAL_CAPSULE | Freq: Once | ORAL | Status: AC
  Administered 2018-04-07: 437 via ORAL

## 2018-04-08 ENCOUNTER — Encounter (HOSPITAL_COMMUNITY)
Admission: RE | Admit: 2018-04-08 | Discharge: 2018-04-08 | Disposition: A | Discharge status: Home / Self Care | Payer: BLUE CROSS/BLUE SHIELD | Source: Ambulatory Visit | Attending: Internal Medicine | Admitting: Internal Medicine

## 2018-04-08 DIAGNOSIS — E0590 Thyrotoxicosis, unspecified without thyrotoxic crisis or storm: Secondary | ICD-10-CM | POA: Diagnosis not present

## 2018-04-09 NOTE — Addendum Note (Signed)
Addended by: Carlus PavlovGHERGHE, Tyshaun Vinzant on: 04/09/2018 05:13 PM   Modules accepted: Orders

## 2018-04-16 ENCOUNTER — Encounter: Payer: Self-pay | Admitting: Sports Medicine

## 2018-04-28 ENCOUNTER — Institutional Professional Consult (permissible substitution): Payer: BLUE CROSS/BLUE SHIELD | Admitting: Internal Medicine

## 2018-05-05 ENCOUNTER — Institutional Professional Consult (permissible substitution): Payer: BLUE CROSS/BLUE SHIELD | Admitting: Internal Medicine

## 2018-05-13 ENCOUNTER — Encounter

## 2018-05-13 ENCOUNTER — Ambulatory Visit: Payer: BLUE CROSS/BLUE SHIELD | Admitting: Gastroenterology

## 2018-05-27 ENCOUNTER — Encounter: Payer: Self-pay | Admitting: Internal Medicine

## 2018-05-27 ENCOUNTER — Ambulatory Visit (INDEPENDENT_AMBULATORY_CARE_PROVIDER_SITE_OTHER): Payer: BLUE CROSS/BLUE SHIELD | Admitting: Internal Medicine

## 2018-05-27 VITALS — BP 156/94 | HR 77 | Ht 66.25 in | Wt 190.0 lb

## 2018-05-27 DIAGNOSIS — Z8701 Personal history of pneumonia (recurrent): Secondary | ICD-10-CM

## 2018-05-27 DIAGNOSIS — L819 Disorder of pigmentation, unspecified: Secondary | ICD-10-CM | POA: Diagnosis not present

## 2018-05-27 DIAGNOSIS — R05 Cough: Secondary | ICD-10-CM

## 2018-05-27 DIAGNOSIS — Z23 Encounter for immunization: Secondary | ICD-10-CM

## 2018-05-27 DIAGNOSIS — R053 Chronic cough: Secondary | ICD-10-CM

## 2018-05-27 LAB — NITRIC OXIDE: Nitric Oxide: 35

## 2018-05-27 NOTE — Patient Instructions (Addendum)
Chronic cough - Plan: Nitric oxide -Common causes acid reflux, asthma and all of this playing a role through hyper irritable airway called cough neuropathy -It is also possible that losartan might be playing a role new cough -Your breathing test was borderline positive for asthma - do full PFT next few weeks -Do CBC with differential count, blood IgE and blood allergy profile today in our office - do flu shot   Discoloration of skin of hand -It is possible although not fully sure that he might have Raynaud discoloration - check ANA, DS-DNA, RF, CCP SCL-70, SSa, SSB, lupus anticoagulatnt panel, ESR   History of Pneumonia - Jan 2019  - do HRCT in decc 2019 [place] as followup  Followup - return next week or two to see  either see me Dr Chase Caller or APP to discuss test results - based on results Rx plan could include stopping losartan +/- asthma Rx +/- gabapentin for cough +/- decisions based on blood work

## 2018-05-27 NOTE — Addendum Note (Signed)
Addended by: Cydney Ok on: 05/27/2018 04:11 PM   Modules accepted: Orders

## 2018-05-27 NOTE — Addendum Note (Signed)
Addended by: Cydney Ok on: 05/27/2018 04:07 PM   Modules accepted: Orders

## 2018-05-27 NOTE — Progress Notes (Addendum)
Subjective:    Patient ID: Melissa Walters, female    DOB: Sep 01, 1965, 52 y.o.   MRN: [id number]  HPI [facility] Panosh, Standley Brooking, MD  IOV 05/27/18  Chief Complaint  Patient presents with  . Pulmonary Consult    Dr. Collene Mares referred, chronic cough at night, wakes up with mucus in throat, had pneumonia 1 year ago but recovered well     52 year old female originally from [place].  She works in a [employer] operational [employer].  Referred by Dr. Collene Mares for chronic cough.  Melissa Walters -tells me that approximately 2 years ago in the spring 2017 she suffered a bout of pneumonia and since then has been left with a chronic cough.  This pneumonia was treated as an outpatient.  Never had inpatient treatment.  The cough though is persistent since then and only recently improved in the last 2 months after she saw Dr. Vaughan Browner and has been started on acid reflux treatment.  Is rated as moderate to severe.  Presents immediately after eating and she can cough significant amount.  It is dry cough.  Also present at night when she sleeping.  According to her husband and family noticed that she is gurgling at night and significantly coughing during the course of the night.  Otherwise she is essentially cough free.  Does not have any shortness of breath orthopnea proximal nocturnal dyspnea.  She had a second bout of pneumonia in January 2019.  Again treated as an outpatient.  She had CT angiography that showed some bilateral lower lobe groundglass opacities that I personally visualized.  But again this was a contrast CT and not high-resolution she does not notice any shortness of breath with exertion or chest pains.  2 months ago she started seeing Dr. Collene Mares and has been given PPI and after the symptoms have improved but she still has residual symptoms.  It is not fully resolved and the still a significant amount of symptoms.  Review of her labs show she has normal eosinophil count.  RSI cough score is 21  Family history: Absent for pulmonary  fibrosis but her mother has myasthenia gravis.  Brother has Devic's disease and other neuromuscular disease.  Her sister lives in [place] has asthma and it is apparently pretty significant.  Blood pressure medication history: She is on losartan since earlier this year.  Allergy history: She does not have any spring of pollen allergies  Acid reflux history: She has significant history of acid reflux.  Hiatal hernia seen on a CT chest earlier this year it is small.  I personally visualized the CT chest.  She is on PPI.  She has never had a pH probe or dysphagia evaluation or endoscopy.  Apparently all this is in the works..  Autoimmune history: I personally thought on exam that her fingertips were blue and suggestive of Raynaud but she denies a history as such especially discoloration in the cold weather.   Dr Lorenza Cambridge Reflux Symptom Index (> 13-15 suggestive of LPR cough) 0 -> 5  =  none ->severe problem  Hoarseness of problem with voice 0  Clearing  Of Throat 1  Excess throat mucus or feeling of post nasal drip 3  Difficulty swallowing food, liquid or tablets 1  Cough after eating or lying down 5  Breathing difficulties or choking episodes 1  Troublesome or annoying cough 5  Sensation of something sticking in throat or lump in throat 0  Heartburn, chest pain, indigestion, or stomach acid coming up  5  TOTAL 21       Simple office walk 185 feet x  3 laps goal with forehead probe 05/27/2018   O2 used no  Number laps completed 3  Comments about pace normal  Resting Pulse Ox/HR 98% and 67/min  Final Pulse Ox/HR 96% and 92/min  Desaturated </= 88% no  Desaturated <= 3% points no  Got Tachycardic >/= 90/min yes  Symptoms at end of test no  Miscellaneous comments X   Feno  - 35 ppb  Results for Melissa, Walters (MRN [id number]) as of 05/27/2018 15:08  Ref. Range 12/15/2013 08:17 10/25/2014 08:32 10/31/2016 08:58 08/17/2017 17:45 11/24/2017 16:04  Eosinophils Absolute Latest Ref Range:  0.0 - 0.7 K/uL 0.1 0.1 0.1  0.1     has a past medical history of ADD (attention deficit disorder), Depression, Mood disorder ([place]), Psoriasis, and Psoriatic arthritis ([place]) (10/2012).   reports that she has never smoked. She has never used smokeless tobacco.  Past Surgical History:  Procedure Laterality Date  . BUNIONECTOMY     bone spur left foot  . ENDOMETRIAL ABLATION      Allergies  Allergen Reactions  . Compazine Swelling    Throat swelling    Immunization History  Administered Date(s) Administered  . Hep A / Hep B 11/01/2014, 01/04/2015  . Influenza Split 09/09/2011  . Influenza,inj,Quad PF,6+ Mos 06/21/2014, 08/21/2017, 05/27/2018  . Td 10/17/2009  . Tdap 09/09/2011    Family History  Problem Relation Age of Onset  . Anxiety disorder Unknown   . Diabetes Father   . Glaucoma Father   . Myasthenia gravis Mother   . Other Mother        [place]  . Other Brother        devic disease NMO neuromyelitis optica  . Hashimoto's thyroiditis Daughter   . Psoriasis Sister   . Depression Sister   . Arthritis Unknown        parents      Current Outpatient Medications:  .  aspirin EC 81 MG tablet, Take 81 mg by mouth daily., Disp: , Rfl:  .  carbamazepine (TEGRETOL XR) 200 MG 12 hr tablet, Take 3 tablets by mouth daily., Disp: , Rfl: 3 .  ibuprofen (ADVIL,MOTRIN) 800 MG tablet, Take 1 tablet (800 mg total) by mouth every 8 (eight) hours as needed., Disp: 60 tablet, Rfl: 1 .  lamoTRIgine (LAMICTAL) 100 MG tablet, Take 200 mg by mouth daily., Disp: , Rfl:  .  lamoTRIgine (LAMICTAL) 200 MG tablet, Take 2 tablets by mouth daily., Disp: , Rfl: 4 .  losartan (COZAAR) 100 MG tablet, Take 1 tablet (100 mg total) by mouth daily., Disp: 30 tablet, Rfl: 11 .  traZODone (DESYREL) 50 MG tablet, Take 50-150 mg by mouth at bedtime as needed for sleep. , Disp: , Rfl: 3 .  zolpidem (AMBIEN) 5 MG tablet, Take 1 tablet (5 mg total) by mouth at bedtime as needed for sleep., Disp: 15 tablet, Rfl:  0   Review of Systems  Constitutional: Negative for fever and unexpected weight change.  HENT: Positive for trouble swallowing. Negative for congestion, dental problem, ear pain, nosebleeds, postnasal drip, rhinorrhea, sinus pressure, sneezing and sore throat.   Eyes: Negative for redness and itching.  Respiratory: Positive for cough and shortness of breath. Negative for chest tightness and wheezing.   Cardiovascular: Negative for palpitations and leg swelling.  Gastrointestinal: Negative for nausea and vomiting.  Genitourinary: Negative for dysuria.  Musculoskeletal: Negative for joint swelling.  Skin: Negative for rash.  Neurological: Negative for headaches.  Hematological: Does not bruise/bleed easily.  Psychiatric/Behavioral: Negative for dysphoric mood. The patient is nervous/anxious.        Objective:   Physical Exam  Constitutional: She is oriented to person, place, and time. She appears well-developed and well-nourished. No distress.  HENT:  Head: Normocephalic and atraumatic.  Right Ear: External ear normal.  Left Ear: External ear normal.  Mouth/Throat: Oropharynx is clear and moist. No oropharyngeal exudate.  Eyes: Pupils are equal, round, and reactive to light. Conjunctivae and EOM are normal. Right eye exhibits no discharge. Left eye exhibits no discharge. No scleral icterus.  Neck: Normal range of motion. Neck supple. No JVD present. No tracheal deviation present. No thyromegaly present.  Cardiovascular: Normal rate, regular rhythm, normal heart sounds and intact distal pulses. Exam reveals no gallop and no friction rub.  No murmur heard. Pulmonary/Chest: Effort normal and breath sounds normal. No respiratory distress. She has no wheezes. She has no rales. She exhibits no tenderness.  Abdominal: Soft. Bowel sounds are normal. She exhibits no distension and no mass. There is no tenderness. There is no rebound and no guarding.  Musculoskeletal: Normal range of motion. She  exhibits no edema or tenderness.  Lymphadenopathy:    She has no cervical adenopathy.  Neurological: She is alert and oriented to person, place, and time. She has normal reflexes. No cranial nerve deficit. She exhibits normal muscle tone. Coordination normal.  Skin: Skin is dry. Capillary refill takes 2 to 3 seconds. No rash noted. She is not diaphoretic. No erythema. No pallor.  Possible bluish discoloration of tip of fingers. ? Increased venous presence  Psychiatric: She has a normal mood and affect. Her behavior is normal. Judgment and thought content normal.  Vitals reviewed.  Vitals:   05/27/18 1438  BP: (!) 156/94  Pulse: 77  SpO2: 97%  Weight: 190 lb (86.2 kg)  Height: 5' 6.25" (1.683 m)    Estimated body mass index is 30.44 kg/m as calculated from the following:   Height as of this encounter: 5' 6.25" (1.683 m).   Weight as of this encounter: 190 lb (86.2 kg).        Assessment & Plan:     ICD-10-CM   1. Chronic cough R05 Nitric oxide    Pulmonary function test    CBC with Differential/Platelet    IgE    Resp Allergy Profile Regn2DC DE MD [facility]    ANA w/Reflex    Rheumatoid Factor    Lupus anticoagulant panel    Sed Rate (ESR)    Cyclic citrul peptide antibody, IgG    CT Chest High Resolution    ANA+ENA+DNA/DS+Scl 70+SjoSSA/B  2. Discoloration of skin of hand L81.9 Pulmonary function test    CBC with Differential/Platelet    IgE    Resp Allergy Profile Regn2DC DE MD [facility]    ANA w/Reflex    Rheumatoid Factor    Lupus anticoagulant panel    Sed Rate (ESR)    Cyclic citrul peptide antibody, IgG    CT Chest High Resolution    ANA+ENA+DNA/DS+Scl 70+SjoSSA/B  3. History of bacterial pneumonia Z87.01 Pulmonary function test    CBC with Differential/Platelet    IgE    Resp Allergy Profile Regn2DC DE MD [facility]    ANA w/Reflex    Rheumatoid Factor    Lupus anticoagulant panel    Sed Rate (ESR)    Cyclic citrul peptide antibody, IgG  CT Chest High  Resolution    ANA+ENA+DNA/DS+Scl 70+SjoSSA/B  4. Need for immunization against influenza Z23 Flu Vaccine QUAD 36+ mos IM   Patient Instructions  Chronic cough - Plan: Nitric oxide -Common causes acid reflux, asthma and all of this playing a role through hyper irritable airway called cough neuropathy -It is also possible that losartan might be playing a role new cough -Your breathing test was borderline positive for asthma - do full PFT next few weeks -Do CBC with differential count, blood IgE and blood allergy profile today in our office - do flu shot   Discoloration of skin of hand -It is possible although not fully sure that he might have Raynaud discoloration - check ANA, DS-DNA, RF, CCP SCL-70, SSa, SSB, lupus anticoagulatnt panel, ESR   History of Pneumonia - Jan 2019  - do HRCT in decc 2019 /Jan 2020 as followup  Followup - return next week or two to see  either see me Dr Chase Caller or APP to discuss test results - based on results Rx plan could include stopping losartan +/- asthma Rx +/- gabapentin for cough +/- decisions based on blood work      SIGNATURE    Dr. Brand Males, M.D., F.C.C.P,  Pulmonary and Critical Care Medicine Staff Physician, [place] Director - Interstitial Lung Disease  Program  [facility] [place] [place], [place], [address]  Pager: [phone], If no answer or between  15:00h - 7:00h: call [phone] Telephone: [phone]  4:08 PM 05/27/2018

## 2018-05-31 DIAGNOSIS — F3181 Bipolar II disorder: Secondary | ICD-10-CM | POA: Diagnosis not present

## 2018-06-07 ENCOUNTER — Other Ambulatory Visit (INDEPENDENT_AMBULATORY_CARE_PROVIDER_SITE_OTHER): Payer: BLUE CROSS/BLUE SHIELD

## 2018-06-07 DIAGNOSIS — L409 Psoriasis, unspecified: Secondary | ICD-10-CM | POA: Diagnosis not present

## 2018-06-07 DIAGNOSIS — Z8701 Personal history of pneumonia (recurrent): Secondary | ICD-10-CM | POA: Diagnosis not present

## 2018-06-07 DIAGNOSIS — Z23 Encounter for immunization: Secondary | ICD-10-CM | POA: Diagnosis not present

## 2018-06-07 DIAGNOSIS — L819 Disorder of pigmentation, unspecified: Secondary | ICD-10-CM

## 2018-06-07 DIAGNOSIS — R05 Cough: Secondary | ICD-10-CM | POA: Diagnosis not present

## 2018-06-07 DIAGNOSIS — R053 Chronic cough: Secondary | ICD-10-CM

## 2018-06-07 DIAGNOSIS — L821 Other seborrheic keratosis: Secondary | ICD-10-CM | POA: Diagnosis not present

## 2018-06-07 DIAGNOSIS — D1800 Hemangioma unspecified site: Secondary | ICD-10-CM | POA: Diagnosis not present

## 2018-06-07 DIAGNOSIS — L814 Other melanin hyperpigmentation: Secondary | ICD-10-CM | POA: Diagnosis not present

## 2018-06-07 LAB — CBC WITH DIFFERENTIAL/PLATELET
BASOS ABS: 0.1 10*3/uL (ref 0.0–0.1)
Basophils Relative: 1.4 % (ref 0.0–3.0)
EOS ABS: 0.2 10*3/uL (ref 0.0–0.7)
EOS PCT: 6.4 % — AB (ref 0.0–5.0)
HCT: 36.7 % (ref 36.0–46.0)
HEMOGLOBIN: 12.5 g/dL (ref 12.0–15.0)
LYMPHS ABS: 1.2 10*3/uL (ref 0.7–4.0)
Lymphocytes Relative: 33.2 % (ref 12.0–46.0)
MCHC: 34 g/dL (ref 30.0–36.0)
MCV: 87.5 fl (ref 78.0–100.0)
MONO ABS: 0.5 10*3/uL (ref 0.1–1.0)
Monocytes Relative: 13.4 % — ABNORMAL HIGH (ref 3.0–12.0)
NEUTROS PCT: 45.6 % (ref 43.0–77.0)
Neutro Abs: 1.7 10*3/uL (ref 1.4–7.7)
Platelets: 219 10*3/uL (ref 150.0–400.0)
RBC: 4.19 Mil/uL (ref 3.87–5.11)
RDW: 14.6 % (ref 11.5–15.5)
WBC: 3.7 10*3/uL — AB (ref 4.0–10.5)

## 2018-06-07 LAB — SEDIMENTATION RATE: Sed Rate: 12 mm/hr (ref 0–30)

## 2018-06-07 NOTE — Addendum Note (Signed)
Addended by: Ebony Cargo on: 06/07/2018 08:42 AM   Modules accepted: Orders

## 2018-06-08 LAB — RESPIRATORY ALLERGY PROFILE REGION II ~~LOC~~
ALLERGEN, D PTERNOYSSINUS, D1: 0.13 kU/L — AB
Allergen, Cedar tree, t12: <0.1 kU/L
Allergen, Comm Silver Birch, t9: <0.1 kU/L
Allergen, Mulberry, t76: <0.1 kU/L
CLADOSPORIUM HERBARUM (M2) IGE: <0.1 kU/L
CLASS: 0
CLASS: 0
CLASS: 0
CLASS: 0
CLASS: 0
CLASS: 0
COMMON RAGWEED (SHORT) (W1) IGE: <0.1 kU/L
Class: 0
Class: 0
Class: 0
Class: 0
Class: 0
Class: 0
Class: 0
Class: 0
Class: 0
Class: 0
Class: 0
Class: 0
Class: 0
Class: 0
Class: 0
Class: 0
Class: 0
Class: 0
Cockroach: <0.1 kU/L
IGE (IMMUNOGLOBULIN E), SERUM: 18 kU/L (ref <=114)
Pecan/Hickory Tree IgE: <0.1 kU/L

## 2018-06-08 LAB — INTERPRETATION:

## 2018-06-08 LAB — CYCLIC CITRUL PEPTIDE ANTIBODY, IGG: Cyclic Citrullin Peptide Ab: <16 UNITS

## 2018-06-08 LAB — RHEUMATOID FACTOR: Rhuematoid fact SerPl-aCnc: <14 IU/mL (ref <14)

## 2018-06-09 LAB — ANA+ENA+DNA/DS+SCL 70+SJOSSA/B
ANA Titer 1: NEGATIVE
DSDNA AB: 1 [IU]/mL (ref 0–9)
ENA RNP AB: 4.8 AI — AB (ref 0.0–0.9)
ENA SSB (LA) Ab: <0.2 AI (ref 0.0–0.9)
Scleroderma SCL-70: 0.3 AI (ref 0.0–0.9)

## 2018-06-09 LAB — ANA W/REFLEX: Anti Nuclear Antibody(ANA): POSITIVE — AB

## 2018-06-09 LAB — ENA+DNA/DS+SJORGEN'S

## 2018-06-09 LAB — LUPUS ANTICOAGULANT PANEL
DRVVT: 34.7 s (ref 0.0–47.0)
PTT Lupus Anticoagulant: 34.3 s (ref 0.0–51.9)

## 2018-06-10 LAB — HM COLONOSCOPY

## 2018-06-16 ENCOUNTER — Encounter: Payer: Self-pay | Admitting: Internal Medicine

## 2018-06-16 DIAGNOSIS — K259 Gastric ulcer, unspecified as acute or chronic, without hemorrhage or perforation: Secondary | ICD-10-CM | POA: Diagnosis not present

## 2018-06-16 DIAGNOSIS — Z1211 Encounter for screening for malignant neoplasm of colon: Secondary | ICD-10-CM | POA: Diagnosis not present

## 2018-06-16 DIAGNOSIS — R1310 Dysphagia, unspecified: Secondary | ICD-10-CM | POA: Diagnosis not present

## 2018-06-16 DIAGNOSIS — K319 Disease of stomach and duodenum, unspecified: Secondary | ICD-10-CM | POA: Diagnosis not present

## 2018-06-16 DIAGNOSIS — K210 Gastro-esophageal reflux disease with esophagitis: Secondary | ICD-10-CM | POA: Diagnosis not present

## 2018-06-16 HISTORY — PX: UPPER GI ENDOSCOPY: SHX6162

## 2018-06-16 LAB — HM COLONOSCOPY

## 2018-06-17 DIAGNOSIS — K210 Gastro-esophageal reflux disease with esophagitis: Secondary | ICD-10-CM | POA: Diagnosis not present

## 2018-07-01 DIAGNOSIS — K449 Diaphragmatic hernia without obstruction or gangrene: Secondary | ICD-10-CM | POA: Diagnosis not present

## 2018-07-01 DIAGNOSIS — K5904 Chronic idiopathic constipation: Secondary | ICD-10-CM | POA: Diagnosis not present

## 2018-07-01 DIAGNOSIS — K259 Gastric ulcer, unspecified as acute or chronic, without hemorrhage or perforation: Secondary | ICD-10-CM | POA: Diagnosis not present

## 2018-07-01 DIAGNOSIS — K210 Gastro-esophageal reflux disease with esophagitis: Secondary | ICD-10-CM | POA: Diagnosis not present

## 2018-07-12 ENCOUNTER — Ambulatory Visit (INDEPENDENT_AMBULATORY_CARE_PROVIDER_SITE_OTHER)
Admission: RE | Admit: 2018-07-12 | Discharge: 2018-07-12 | Disposition: A | Discharge status: Home / Self Care | Payer: BLUE CROSS/BLUE SHIELD | Source: Ambulatory Visit | Attending: Internal Medicine | Admitting: Internal Medicine

## 2018-07-12 DIAGNOSIS — Z8701 Personal history of pneumonia (recurrent): Secondary | ICD-10-CM

## 2018-07-12 DIAGNOSIS — R053 Chronic cough: Secondary | ICD-10-CM

## 2018-07-12 DIAGNOSIS — R05 Cough: Secondary | ICD-10-CM | POA: Diagnosis not present

## 2018-07-12 DIAGNOSIS — K449 Diaphragmatic hernia without obstruction or gangrene: Secondary | ICD-10-CM | POA: Diagnosis not present

## 2018-07-13 ENCOUNTER — Encounter: Payer: Self-pay | Admitting: Primary Care

## 2018-07-13 ENCOUNTER — Ambulatory Visit (INDEPENDENT_AMBULATORY_CARE_PROVIDER_SITE_OTHER): Payer: BLUE CROSS/BLUE SHIELD | Admitting: Primary Care

## 2018-07-13 ENCOUNTER — Ambulatory Visit (INDEPENDENT_AMBULATORY_CARE_PROVIDER_SITE_OTHER): Payer: BLUE CROSS/BLUE SHIELD | Admitting: Internal Medicine

## 2018-07-13 DIAGNOSIS — R05 Cough: Secondary | ICD-10-CM

## 2018-07-13 DIAGNOSIS — L819 Disorder of pigmentation, unspecified: Secondary | ICD-10-CM

## 2018-07-13 DIAGNOSIS — K259 Gastric ulcer, unspecified as acute or chronic, without hemorrhage or perforation: Secondary | ICD-10-CM | POA: Insufficient documentation

## 2018-07-13 DIAGNOSIS — L4050 Arthropathic psoriasis, unspecified: Secondary | ICD-10-CM

## 2018-07-13 DIAGNOSIS — R053 Chronic cough: Secondary | ICD-10-CM

## 2018-07-13 DIAGNOSIS — Z8701 Personal history of pneumonia (recurrent): Secondary | ICD-10-CM

## 2018-07-13 DIAGNOSIS — R059 Cough, unspecified: Secondary | ICD-10-CM

## 2018-07-13 LAB — PULMONARY FUNCTION TEST
DL/VA % PRED: 85 %
DL/VA: 4.4 ml/min/mmHg/L
DLCO cor % pred: 87 %
DLCO cor: 24.7 ml/min/mmHg
DLCO unc % pred: 84 %
DLCO unc: 23.99 ml/min/mmHg
FEF 25-75 Post: 3.34 L/sec
FEF 25-75 Pre: 2.55 L/sec
FEF2575-%CHANGE-POST: 30 %
FEF2575-%PRED-POST: 120 %
FEF2575-%Pred-Pre: 91 %
FEV1-%CHANGE-POST: 6 %
FEV1-%PRED-PRE: 96 %
FEV1-%Pred-Post: 102 %
FEV1-PRE: 2.89 L
FEV1-Post: 3.08 L
FEV1FVC-%CHANGE-POST: 5 %
FEV1FVC-%Pred-Pre: 98 %
FEV6-%Change-Post: 1 %
FEV6-%PRED-PRE: 98 %
FEV6-%Pred-Post: 100 %
FEV6-PRE: 3.68 L
FEV6-Post: 3.72 L
FEV6FVC-%Change-Post: 0 %
FEV6FVC-%PRED-PRE: 103 %
FEV6FVC-%Pred-Post: 103 %
FVC-%CHANGE-POST: 0 %
FVC-%PRED-POST: 97 %
FVC-%PRED-PRE: 96 %
FVC-POST: 3.72 L
FVC-Pre: 3.69 L
POST FEV6/FVC RATIO: 100 %
PRE FEV6/FVC RATIO: 100 %
Post FEV1/FVC ratio: 83 %
Pre FEV1/FVC ratio: 78 %
RV % PRED: 118 %
RV: 2.38 L
TLC % pred: 111 %
TLC: 6.13 L

## 2018-07-13 NOTE — Assessment & Plan Note (Addendum)
-   Endoscopy on 11/6 showed esophagitis, small non-bleeding ulcer, medium hiatal hernia  - Following with GI, started on Dexilant and Carafate

## 2018-07-13 NOTE — Assessment & Plan Note (Signed)
-   ANA positive, RNP elevated at 4.8 on most recent lab work  - Instructed patient to follow up with her rheumatologist regarding results (r/o connective tissue disease or lupus) - HRCT negative for ILD

## 2018-07-13 NOTE — Progress Notes (Signed)
@Patient  ID: Melissa Walters, female    DOB: 1965/10/12, 52 y.o.   MRN: [id number]  Chief Complaint  Patient presents with  . Follow-up    better    Referring provider: Madelin Headings, MD  HPI: 52 year old female, never smoked. PMH significant for psoriatic arthritis, previous pneumonia x2. Patient of Dr. Marchelle Gearing, seen for initial consult on 05/27/18 for chronic cough symptoms since 2017. Cough is dry and occurs immediately after eating. She was started on a PPI and symptoms did improve but still had residual cough. RSI cough score is 21  History: Family history: Absent for pulmonary fibrosis but her mother has myasthenia gravis.  Brother has Devic's disease and other neuromuscular disease.  Her sister lives in [place] has asthma and it is apparently pretty significant.  Blood pressure medication history: She is on losartan since earlier this year.  Allergy history: She does not have any spring of pollen allergies  Acid reflux history: She has significant history of acid reflux.  Hiatal hernia seen on a CT chest earlier this year it is small.  I personally visualized the CT chest.  She is on PPI.  She has never had a pH probe or dysphagia evaluation or endoscopy.  Apparently all this is in the works..  Autoimmune history: on exam her fingertips were blue and suggestive of Raynaud but she denies a history as such especially discoloration in the cold weather.   07/13/2018 Patient presents today for follow-up visit with full PFTs. HRCT negative for ILD, did show some mild air trapping indicative of small airway disease. ANA and ENA/RNP positive. She does follow with rheumatology for psoriasis. Pulmonary function tests today showed no evidence of restriction or obstruction. She has been following with GI, endoscopy in early November showed grade D reflux esophagitis, medium sized hiatal hernia, one small non-bleeding gastric ulcer. She was started on Dexilant and Carafate for likely 6  months. Reports that she feels a lot better, cough has improved significantly. Has some dyspnea with stairs, agrees likely r/t to conditioning level. Denies fever or current rash or joint pain.   Testing: PFTs 07/13/2018 - FVC 3.72 (97%), FEV1 3.08 (102%), ratio 83, DLCOcor 87%, mid-flow reversibility   HRCT 07/12/18 - No evidence to suggest interstitial lung disease. Mild air trapping indicative of mild small airways disease.  ANA - positive  RNP- 4.8   Allergy panel - pos dust mite  Eos absolute 0.2    Allergies  Allergen Reactions  . Compazine Swelling    Throat swelling    Immunization History  Administered Date(s) Administered  . Hep A / Hep B 11/01/2014, 01/04/2015  . Influenza Split 09/09/2011  . Influenza,inj,Quad PF,6+ Mos 06/21/2014, 08/21/2017, 05/27/2018  . Td 10/17/2009  . Tdap 09/09/2011    Past Medical History:  Diagnosis Date  . ADD (attention deficit disorder)    poss  . Depression   . Mood disorder ([facility])   . Psoriasis   . Psoriatic arthritis ([facility]) 10/2012    Tobacco History: Social History   Tobacco Use  Smoking Status Never Smoker  Smokeless Tobacco Never Used   Counseling given: Not Answered   Outpatient Medications Prior to Visit  Medication Sig Dispense Refill  . aspirin EC 81 MG tablet Take 81 mg by mouth daily.    . carbamazepine (TEGRETOL XR) 200 MG 12 hr tablet Take 3 tablets by mouth daily.  3  . dexlansoprazole (DEXILANT) 60 MG capsule Take 60 mg by mouth daily.    [name]  ibuprofen (ADVIL,MOTRIN) 800 MG tablet Take 1 tablet (800 mg total) by mouth every 8 (eight) hours as needed. 60 tablet 1  . lamoTRIgine (LAMICTAL) 100 MG tablet Take 200 mg by mouth daily.    [name] lamoTRIgine (LAMICTAL) 200 MG tablet Take 2 tablets by mouth daily.  4  . losartan (COZAAR) 100 MG tablet Take 1 tablet (100 mg total) by mouth daily. 30 tablet 11  . sucralfate (CARAFATE) 1 g tablet   0  . traZODone (DESYREL) 50 MG tablet Take 50-150 mg by mouth at bedtime as  needed for sleep.   3  . zolpidem (AMBIEN) 5 MG tablet Take 1 tablet (5 mg total) by mouth at bedtime as needed for sleep. 15 tablet 0   No facility-administered medications prior to visit.     Review of Systems  Review of Systems  Constitutional: Negative.   Respiratory: Negative.   Cardiovascular: Negative.     Physical Exam  BP 124/80 (BP Location: Left Arm, Cuff Size: Normal)   Pulse 72   Temp 97.8 F (36.6 C)   Ht 5\' 7"  (1.702 m)   Wt 190 lb (86.2 kg)   SpO2 97%   BMI 29.76 kg/m  Physical Exam  Constitutional: She is oriented to person, place, and time. She appears well-developed and well-nourished.  HENT:  Head: Normocephalic and atraumatic.  Eyes: Pupils are equal, round, and reactive to light. EOM are normal.  Neck: Normal range of motion. Neck supple.  Cardiovascular: Normal rate, regular rhythm and normal heart sounds.  No murmur heard. Pulmonary/Chest: Effort normal and breath sounds normal. No respiratory distress. She has no wheezes.  Abdominal: Soft. Bowel sounds are normal. There is no tenderness.  Musculoskeletal: Normal range of motion.  Neurological: She is alert and oriented to person, place, and time.  Skin: Skin is warm and dry. Capillary refill takes less than 2 seconds. No rash noted. No erythema.  No discoloration noted to fingers, No clubbing of nails   Psychiatric: She has a normal mood and affect. Her behavior is normal. Judgment and thought content normal.     Lab Results:  CBC    Component Value Date/Time   WBC 3.7 (L) 06/07/2018 0843   RBC 4.19 06/07/2018 0843   HGB 12.5 06/07/2018 0843   HCT 36.7 06/07/2018 0843   PLT 219.0 06/07/2018 0843   MCV 87.5 06/07/2018 0843   MCH 28.8 08/17/2017 1745   MCHC 34.0 06/07/2018 0843   RDW 14.6 06/07/2018 0843   LYMPHSABS 1.2 06/07/2018 0843   MONOABS 0.5 06/07/2018 0843   EOSABS 0.2 06/07/2018 0843   BASOSABS 0.1 06/07/2018 0843    BMET    Component Value Date/Time   NA 123 (L)  11/24/2017 1604   K 4.3 11/24/2017 1604   CL 90 (L) 11/24/2017 1604   CO2 24 11/24/2017 1604   GLUCOSE 89 11/24/2017 1604   BUN 8 11/24/2017 1604   CREATININE 0.63 11/24/2017 1604   CALCIUM 9.2 11/24/2017 1604   GFRNONAA >60 08/17/2017 1745   GFRAA >60 08/17/2017 1745    BNP No results found for: BNP  ProBNP No results found for: PROBNP  Imaging: Ct Chest High Resolution  Result Date: 07/13/2018 CLINICAL DATA:  52 year old female with history of pneumonia in fall 2018 and again in January 2019 presenting with persistent dry cough and wheezing. Evaluate for interstitial lung disease. EXAM: CT CHEST WITHOUT CONTRAST TECHNIQUE: Multidetector CT imaging of the chest was performed following the standard protocol without intravenous contrast.  High resolution imaging of the lungs, as well as inspiratory and expiratory imaging, was performed. COMPARISON:  Chest CT 08/17/2017. FINDINGS: Cardiovascular: Heart size is normal. There is no significant pericardial fluid, thickening or pericardial calcification. No significant atherosclerotic calcifications noted in the thoracic aorta or the coronary arteries. Mediastinum/Nodes: No pathologically enlarged mediastinal or hilar lymph nodes. Please note that accurate exclusion of hilar adenopathy is limited on noncontrast CT scans. Small hiatal hernia. No axillary lymphadenopathy. Lungs/Pleura: High-resolution images demonstrate no significant regions of ground-glass attenuation, subpleural reticulation, parenchymal banding, traction bronchiectasis or frank honeycombing. Inspiratory and expiratory imaging demonstrates some mild air trapping indicative of mild small airways disease. No acute consolidative airspace disease. No pleural effusions. No suspicious appearing pulmonary nodules or masses are noted. Upper Abdomen: Unremarkable. Musculoskeletal: There are no aggressive appearing lytic or blastic lesions noted in the visualized portions of the skeleton.  IMPRESSION: 1. No evidence to suggest interstitial lung disease. 2. Mild air trapping indicative of mild small airways disease. Electronically Signed   By: Trudie Reedaniel  Entrikin M.D.   On: 07/13/2018 08:13     Assessment & Plan:   Cough after eating - Cough has significantly improved/resolved with Dexilant and Carafate - HRCT negative for ILD, mild air trapping  - PFTs with no obstruction or restriction, normal DLCO, No sig BD response, mid-flow reversibility  - Do not recommend treatment with ICS at this time - Follow-up as needed or if symptoms worsen   Gastric ulcer - Endoscopy on 11/6 showed esophagitis, small non-bleeding ulcer, medium hiatal hernia  - Following with GI, started on Dexilant and Carafate   Psoriatic arthritis - ANA positive, RNP elevated at 4.8 on most recent lab work  - Instructed patient to follow up with her rheumatologist regarding results (r/o connective tissue disease or lupus) - HRCT negative for ILD    Glenford BayleyElizabeth W Taneesha Edgin, NP 07/13/2018

## 2018-07-13 NOTE — Assessment & Plan Note (Addendum)
-   Cough has significantly improved/resolved with Dexilant and Carafate - HRCT negative for ILD, mild air trapping  - PFTs with no obstruction or restriction, normal DLCO, No sig BD response, mid-flow reversibility  - Do not recommend treatment with ICS at this time - Follow-up as needed or if symptoms worsen

## 2018-07-13 NOTE — Patient Instructions (Addendum)
Pulmonary function tests showed no evidence of restriction or obstruction  Labs were positive for ANA and ENA RNP (please review with your rheumatologist) Allergy panel mildly positive for dust mites WBC 3.7 (follow with PCP)  Continue GI recommendations   Return as needed or if symptoms worsen

## 2018-07-13 NOTE — Progress Notes (Signed)
PFT done today. 

## 2018-07-15 ENCOUNTER — Telehealth: Payer: Self-pay | Admitting: Primary Care

## 2018-07-15 NOTE — Telephone Encounter (Signed)
Attempted to call pt but unable to reach her. Left pt a detailed message stating to her that we would not need her to come in for an appt until 1 year per MR based on her OV with Buelah ManisBeth Walsh, NP. I have put the recall in pt's chart in regards to this. Nothing further needed.

## 2018-07-15 NOTE — Telephone Encounter (Signed)
Notes recorded by Vallery Saox, Heather C, CMA on 07/14/2018 at 10:56 AM EST Called patient, unable to reach left message to give us a call back. Per BW avs after patients visit on 12.3.19 patient was to return if symptoms worsen. MR please advise, thank you. ------  Notes recorded by Kalman Shanamaswamy, Murali, MD on 07/13/2018 at 9:58 AM EST CT chest with small hiatal hernia and mild air trapping but no significant pathology. Having PFT 07/13/2018 - But I do not see followup. Please arrange for a followup to discuss test results  Called and spoke with pt letting her know the results of the CT and per the results, MR wanted us to schedule pt a follow up visit with him. Pt expressed understanding and also stated that she was currently on a plane and would call us back after she gets off the plane to get the appt scheduled.  When pt calls back, please arrange a f/u OV for pt with MR to go over results of CT and PFT. Thanks!

## 2018-07-15 NOTE — Telephone Encounter (Signed)
Reviewed Melissa Walters APP notes- please change folllowup to 1 year instead of no followup. THis is becuae on exam I thought she had Rayanud and she did have some antibodies positive and might have mild dyspnea. Though there is no ILD on HRCT she might be at risk for pulm htn. So good to have followup in 1 yuear

## 2018-07-27 ENCOUNTER — Encounter: Payer: Self-pay | Admitting: Internal Medicine

## 2018-07-27 ENCOUNTER — Ambulatory Visit (INDEPENDENT_AMBULATORY_CARE_PROVIDER_SITE_OTHER): Payer: BLUE CROSS/BLUE SHIELD | Admitting: Internal Medicine

## 2018-07-27 VITALS — BP 128/90 | HR 84 | Ht 67.0 in | Wt 194.0 lb

## 2018-07-27 DIAGNOSIS — E041 Nontoxic single thyroid nodule: Secondary | ICD-10-CM

## 2018-07-27 DIAGNOSIS — E049 Nontoxic goiter, unspecified: Secondary | ICD-10-CM

## 2018-07-27 DIAGNOSIS — E0590 Thyrotoxicosis, unspecified without thyrotoxic crisis or storm: Secondary | ICD-10-CM | POA: Diagnosis not present

## 2018-07-27 LAB — T3, FREE: T3 FREE: 2.9 pg/mL (ref 2.3–4.2)

## 2018-07-27 LAB — T4, FREE: Free T4: 0.72 ng/dL (ref 0.60–1.60)

## 2018-07-27 LAB — TSH: TSH: 0.38 u[IU]/mL (ref 0.35–4.50)

## 2018-07-27 NOTE — Progress Notes (Signed)
Patient ID: Melissa Walters, female   DOB: 11-Jan-1966, 52 y.o.   MRN: [id number]    HPI  Melissa Walters is a 52 y.o.-year-old female, returning for follow-up for goiter and a thyroid nodule seen on chest CT (to R/o PE).  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  Last visit 6 months ago.  She was dx'ed with a stomach ulcer.  This is now under treatment.  She had some wheezing >> saw Dr. Colletta Maryland.  Reviewed and addended history: Patient was incidentally found to have a goiter and a thyroid nodule on the recent chest CT done to rule out PE.  She was referred to endocrinology for further investigation.  Chest CT (08/17/2017): Hypodense 12 mm right-sided nodule of the mildly enlarged thyroid gland. Coarse calcifications are seen in the lower pole the right thyroid gland. Slight retroclavicular extension of right lobe of the thyroid gland is seen.  After last visit, we checked a: Thyroid uptake and scan (04/08/2018):  Homogeneous tracer distribution in both thyroid lobes. No focal areas of increased or decreased tracer localization seen. 6 hour I-123 uptake = 15.2% (normal 5-20%) 24 hour I-123 uptake = 29.8% (normal 10-30%) IMPRESSION: Normal exam.  After the above results returned, I referred her for a thyroid ultrasound, but she did not have this done yet...  Pt denies: - feeling nodules in neck - hoarseness - dysphagia - choking - SOB with lying down She had thick secretions and coughing after meals at last OV.  She had the same problems even after she started to eliminate dairy. Now resolved after ulcer tx.  She has a history of intermittent subclinical thyrotoxicosis.  Reviewed patient's TFTs: Lab Results  Component Value Date   TSH 0.31 (L) 01/25/2018   TSH 0.96 08/21/2017   TSH 0.248 (L) 08/17/2017   TSH 0.36 10/31/2016   TSH 0.94 10/25/2014   TSH 0.29 (L) 12/15/2013   TSH  0.41 09/29/2012   TSH 0.28 (L) 09/16/2012   TSH 1.00 09/02/2011   TSH 0.63 10/10/2009   FREET4 0.86 01/25/2018   FREET4 0.86 08/21/2017   FREET4 0.88 08/17/2017   FREET4 0.93 10/25/2014   FREET4 0.79 09/29/2012   FREET4 0.9 09/28/2007   FREET4 0.9 10/21/2006    Lab Results  Component Value Date   T3FREE 2.9 01/25/2018   T3FREE 2.9 08/21/2017   T3FREE 3.6 10/25/2014   T3FREE 2.2 (L) 09/29/2012   She had normal thyroid antibodies: Component     Latest Ref Rng & Units 08/21/2017  Thyroglobulin Ab     < or = 1 IU/mL <1  Thyroperoxidase Ab SerPl-aCnc     <9 IU/mL <1   Her TSI antibodies were normal: Lab Results  Component Value Date   TSI <89 01/25/2018   t[tsi:10 of thyroid ds. - daughters. No FH of thyroid cancer. No h/o radiation tx to head or neck.  No seaweed or kelp. No recent contrast studies. No herbal supplements. No Biotin use. No recent steroids use.   Pt also has a history of bipolar, psoriasis and psoriatic arthriti, + HTN, anemia, GERD. + FH of autoimmune ds..  ROS: Constitutional: + weight gain/no weight loss,+ no fatigue, no subjective hyperthermia, no subjective hypothermia Eyes: no blurry vision, no xerophthalmia ENT: no sore throat, + see HPI Cardiovascular: no CP/no SOB/no palpitations/no leg swelling Respiratory: no cough/no SOB/no wheezing Gastrointestinal: no N/no V/no D/+ C/no acid reflux Musculoskeletal:  no muscle aches/no joint aches Skin: no rashes, no hair loss Neurological: no tremors/no numbness/no tingling/no dizziness  I reviewed pt's medications, allergies, PMH, social hx, family hx, and changes were documented in the history of present illness. Otherwise, unchanged from my initial visit note.  Past Medical History:  Diagnosis Date  . ADD (attention deficit disorder)    poss  . Depression   . Mood disorder (HCC)   . Psoriasis   . Psoriatic arthritis (HCC) 10/2012   Past Surgical History:  Procedure Laterality Date  . BUNIONECTOMY      bone spur left foot  . ENDOMETRIAL ABLATION     Social History   Socioeconomic History  . Marital status: Married    Spouse name: Not on file  . Number of children: 4  . Years of education: Not on file  . Highest education level: Not on file  Occupational History  .  Interior and spatial designer of senior [employer]  Tobacco Use  . Smoking status: Never Smoker  . Smokeless tobacco: Never Used  Substance and Sexual Activity  . Alcohol use: Yes: 2-3 drinks 3 times a week, wine    Comment: Socially   . Drug use: No  Social History Narrative    Married   No caffeine   Managing HH   Of 4    Pet dog   Regular exercise- yes  Not much in winter    BA degree      Current Outpatient Medications on File Prior to Visit  Medication Sig Dispense Refill  . aspirin EC 81 MG tablet Take 81 mg by mouth daily.    . carbamazepine (TEGRETOL XR) 200 MG 12 hr tablet Take 3 tablets by mouth daily.  3  . dexlansoprazole (DEXILANT) 60 MG capsule Take 60 mg by mouth daily.    [name] ibuprofen (ADVIL,MOTRIN) 800 MG tablet Take 1 tablet (800 mg total) by mouth every 8 (eight) hours as needed. 60 tablet 1  . lamoTRIgine (LAMICTAL) 100 MG tablet Take 200 mg by mouth daily.    [name] lamoTRIgine (LAMICTAL) 200 MG tablet Take 2 tablets by mouth daily.  4  . losartan (COZAAR) 100 MG tablet Take 1 tablet (100 mg total) by mouth daily. 30 tablet 11  . sucralfate (CARAFATE) 1 g tablet   0  . traZODone (DESYREL) 50 MG tablet Take 50-150 mg by mouth at bedtime as needed for sleep.   3  . zolpidem (AMBIEN) 5 MG tablet Take 1 tablet (5 mg total) by mouth at bedtime as needed for sleep. 15 tablet 0   No current facility-administered medications on file prior to visit.    Allergies  Allergen Reactions  . Compazine Swelling    Throat swelling   Family History  Problem Relation Age of Onset  . Anxiety disorder Unknown   . Diabetes Father   . Glaucoma Father   . Myasthenia gravis Mother   . Other Mother         PA  . Other Brother        devic disease NMO neuromyelitis optica  . Hashimoto's thyroiditis Daughter   . Psoriasis Sister   . Depression Sister   . Arthritis Unknown        parents     PE: BP 128/90   Pulse 84   Ht 5\' 7"  (1.702 m)   Wt 194 lb (88 kg)   SpO2 96%   BMI 30.38 kg/m   Wt Readings from Last 3  Encounters:  07/27/18 194 lb (88 kg)  07/13/18 190 lb (86.2 kg)  05/27/18 190 lb (86.2 kg)   Constitutional: overweight, in NAD Eyes: PERRLA, EOMI, no exophthalmos ENT: moist mucous membranes, no thyromegaly, no cervical lymphadenopathy Cardiovascular: RRR, No MRG Respiratory: CTA B Gastrointestinal: abdomen soft, NT, ND, BS+ Musculoskeletal: no deformities, strength intact in all 4 Skin: moist, warm, no rashes Neurological: no tremor with outstretched hands, DTR normal in all 4  ASSESSMENT: 1. Goiter  2. Thyroid nodule  3. Subclinical thyrotoxicosis  PLAN: 1. Goiter and 2. thyroid nodule -Reviewed the results of her CT of the neck.  Thyroid gland appears mildly enlarged and does have some calcifications.  She has a thyroid nodule (1.2 cm per CT report) that was not very well characterized.  I discussed with her that we will need an ultrasound to get a better evaluation of the nodule.  It is reassuring that this nodule did not appear cold on the recent thyroid uptake and scan. - Pt does not have a thyroid cancer family history or a personal history of RxTx to head/neck.  These would favor benignity. -We may need to do a thyroid biopsy -No neck compression symptoms -The right thyroid nodule is far from her esophagus so it is unlikely that this may cause compression symptoms. - will order the thyroid U/S again -I will see her back in a year.  However we may change this depending on the results of her ultrasound.  3. Subclinical thyrotoxicosis -This is mild and fluctuating -She had palpitations in 08/2017 after having pneumonia, and this resolved -Not  tachycardic at today's visit -Reviewed latest set of TFTs and the TSH was mildly low -Uptake and scan was normal, without increased uptake or any hot nodule -We will repeat her TFTs today, no intervention needed for now, but may need to use low-dose methimazole in the future  Component     Latest Ref Rng & Units 07/27/2018  TSH     0.35 - 4.50 uIU/mL 0.38  T4,Free(Direct)     0.60 - 1.60 ng/dL 1.610.72  Triiodothyronine,Free,Serum     2.3 - 4.2 pg/mL 2.9   Normal TFTs.  No intervention needed at this time.  We will addend the results of the ultrasound as soon as it returns  Carlus Pavlovristina Ahna Konkle, MD PhD [facility]

## 2018-07-27 NOTE — Patient Instructions (Signed)
Please stop at the lab at [facility].  Please come back for labs in 6 months and for a visit in 1 year.

## 2018-09-16 ENCOUNTER — Ambulatory Visit: Payer: Self-pay | Admitting: *Deleted

## 2018-09-16 NOTE — Telephone Encounter (Signed)
Patient is out of state- she is taking care of her mother after her surgery. Patient has started having UTI symptoms and she did OTC home UTI test. Patient states both the nitrite and leukocytes were strongly positive. Patient is requesting antibiotic be sent to :  [facility] [address] [place] [phone]  Patient is reluctant to go to UC now- will follow up if treatment does not help symptoms. Request forwarded to provider per patient request.   Reason for Disposition . Urinating more frequently than usual (i.e., frequency)  Answer Assessment - Initial Assessment Questions 1. SYMPTOM: "What's the main symptom you're concerned about?" (e.g., frequency, incontinence)     Frequency, discomfort 2. ONSET: "When did the  frequency  start?"     Over 8-10 days 3. PAIN: "Is there any pain?" If so, ask: "How bad is it?" (Scale: 1-10; mild, moderate, severe)     Discomfort- 1-2  4. CAUSE: "What do you think is causing the symptoms?"     UTI- patient has done OTC test and it came back positive 5. OTHER SYMPTOMS: "Do you have any other symptoms?" (e.g., fever, flank pain, blood in urine, pain with urination)     Frequency, urgency- not emptying completely 6. PREGNANCY: "Is there any chance you are pregnant?" "When was your last menstrual period?"     n/a  Protocols used: URINARY [facility]

## 2018-09-16 NOTE — Telephone Encounter (Signed)
Please advise 

## 2018-09-17 ENCOUNTER — Other Ambulatory Visit: Payer: Self-pay

## 2018-09-17 MED ORDER — NITROFURANTOIN MONOHYD MACRO 100 MG PO CAPS: 100.0000 mg | ORAL_CAPSULE | Freq: Two times a day (BID) | ORAL | 0 refills | Status: DC

## 2018-09-17 NOTE — Telephone Encounter (Signed)
Pt calling to check status of message and any advice/recommendations/medications. Pt advised provider is in clinic and will review message as soon as possible.

## 2018-09-17 NOTE — Telephone Encounter (Signed)
okk to send in  macrobid  100 mg 1 po bid for 5 days  Disp 10   I f  persistent or progressive will need   visit and urince culture

## 2018-09-17 NOTE — Telephone Encounter (Signed)
Called pt and sent in prescription for pt

## 2018-10-03 IMAGING — DX DG ELBOW COMPLETE 3+V*L*
4 series · 4 of 4 positions shown · non-contrast
Comparison: None.

CLINICAL DATA: Left radial head fracture follow-up

EXAM:
LEFT ELBOW - COMPLETE 3+ VIEW

[elbow ap]
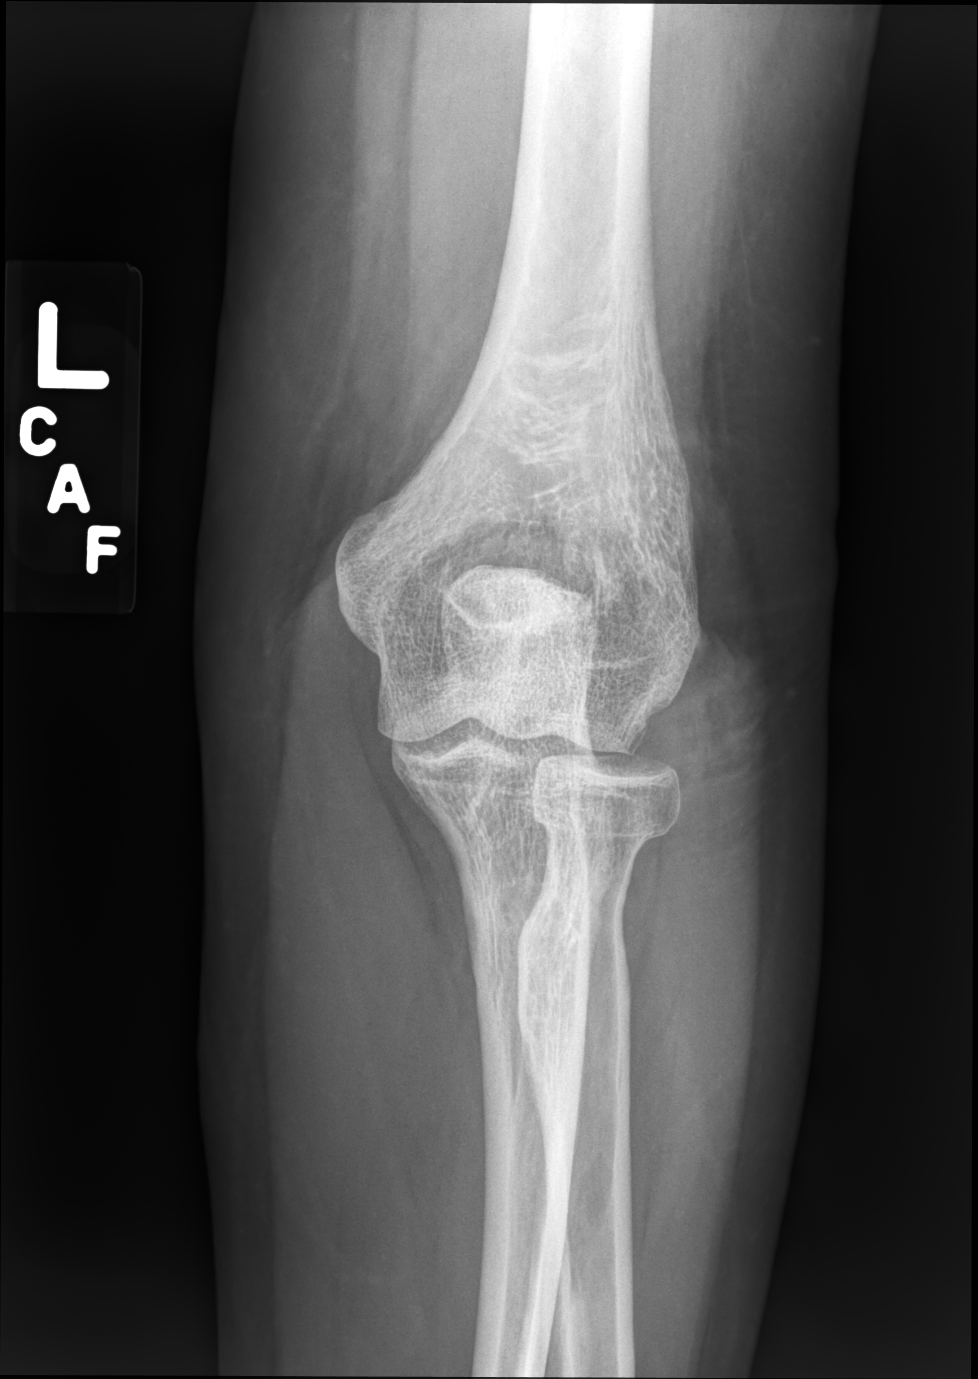

[elbow medial oblique]
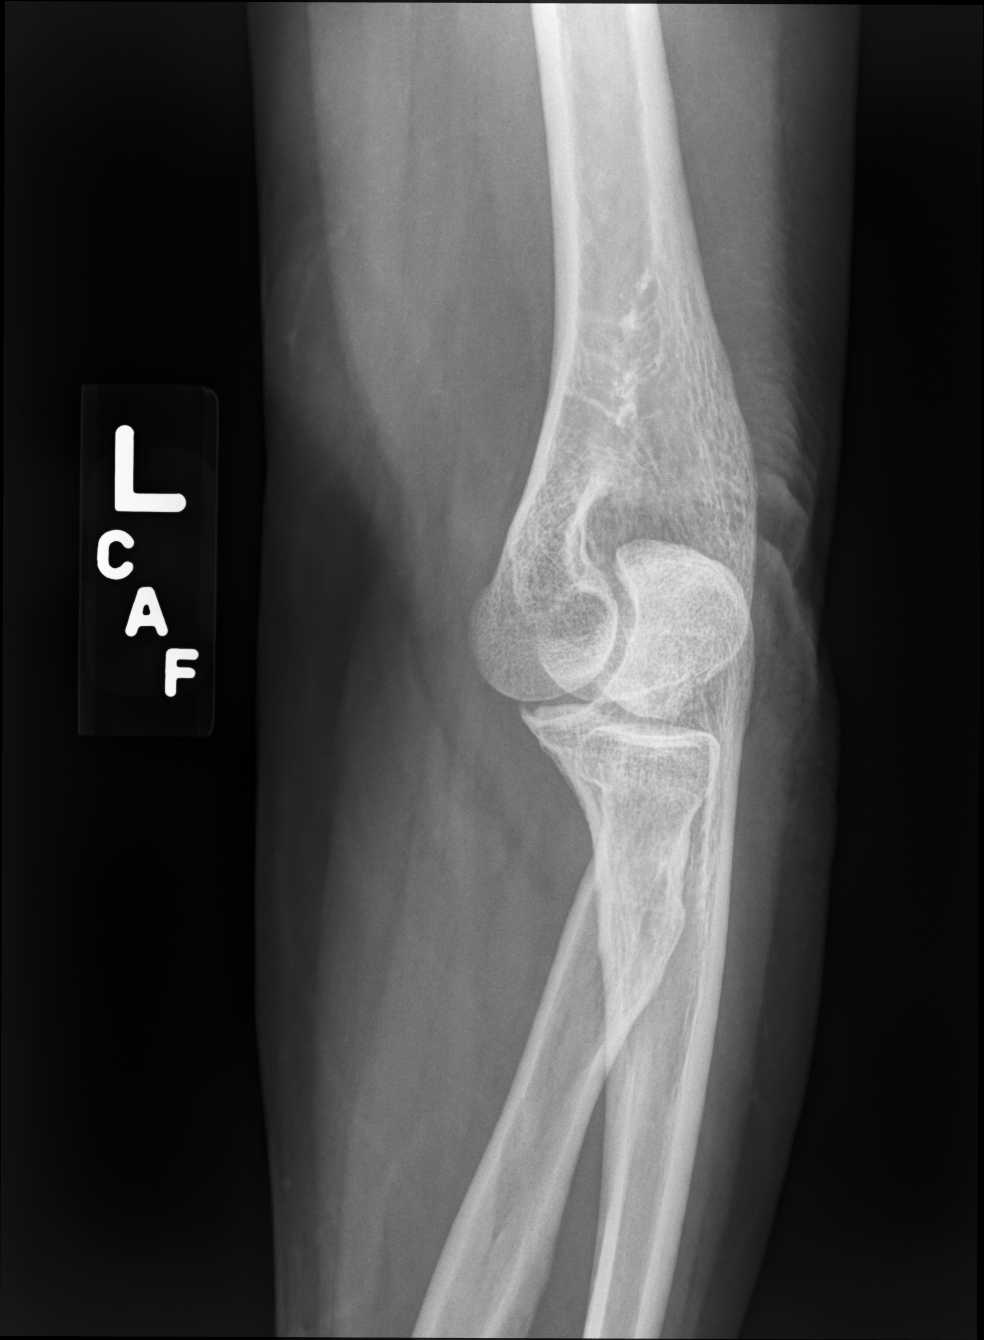

[elbow lateral oblique]
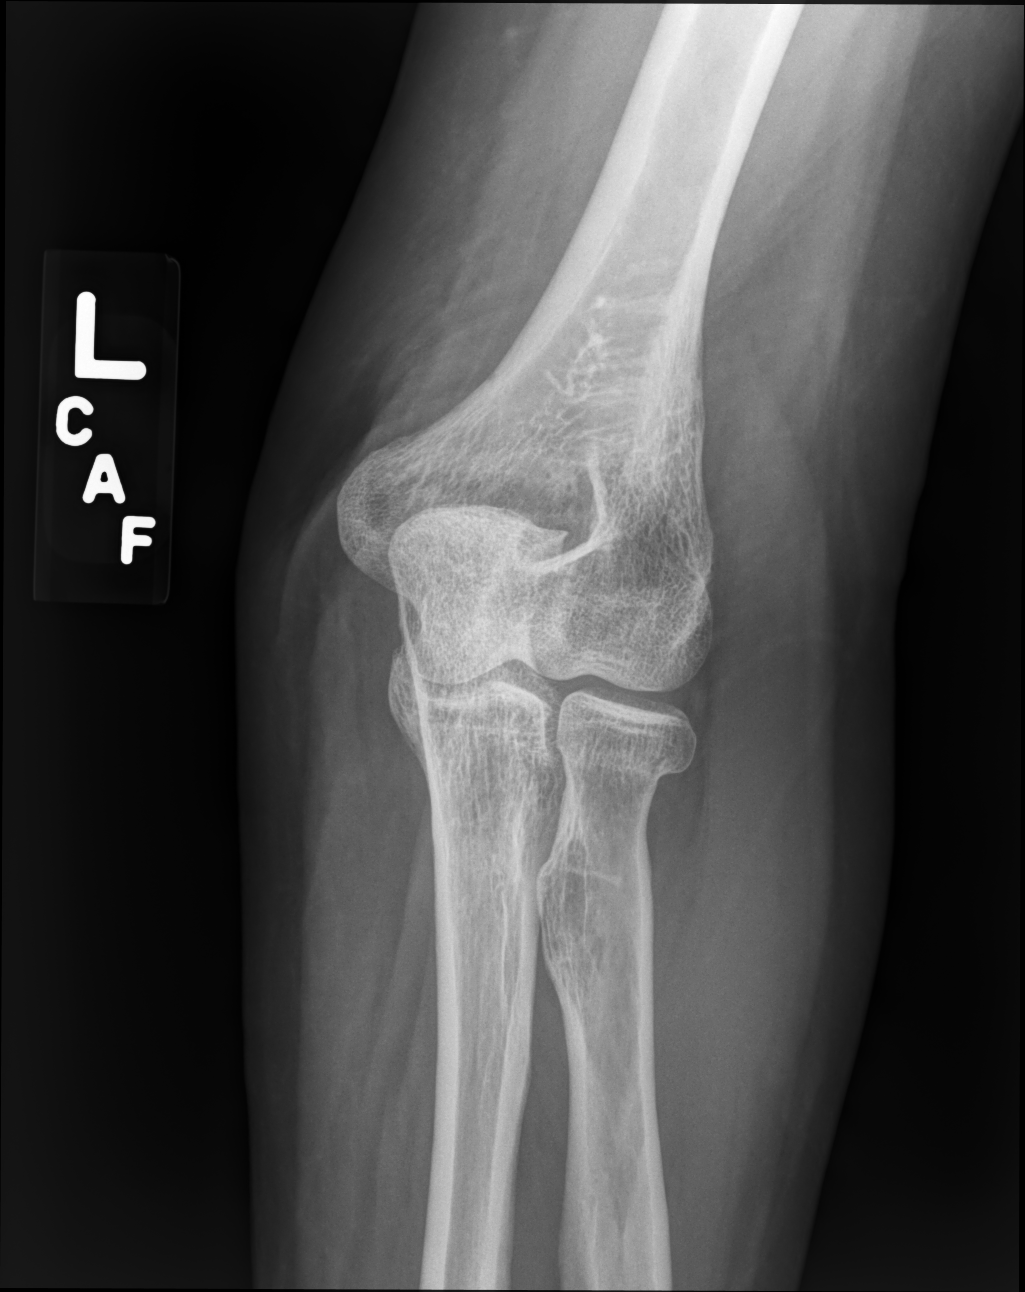

[elbow lat]
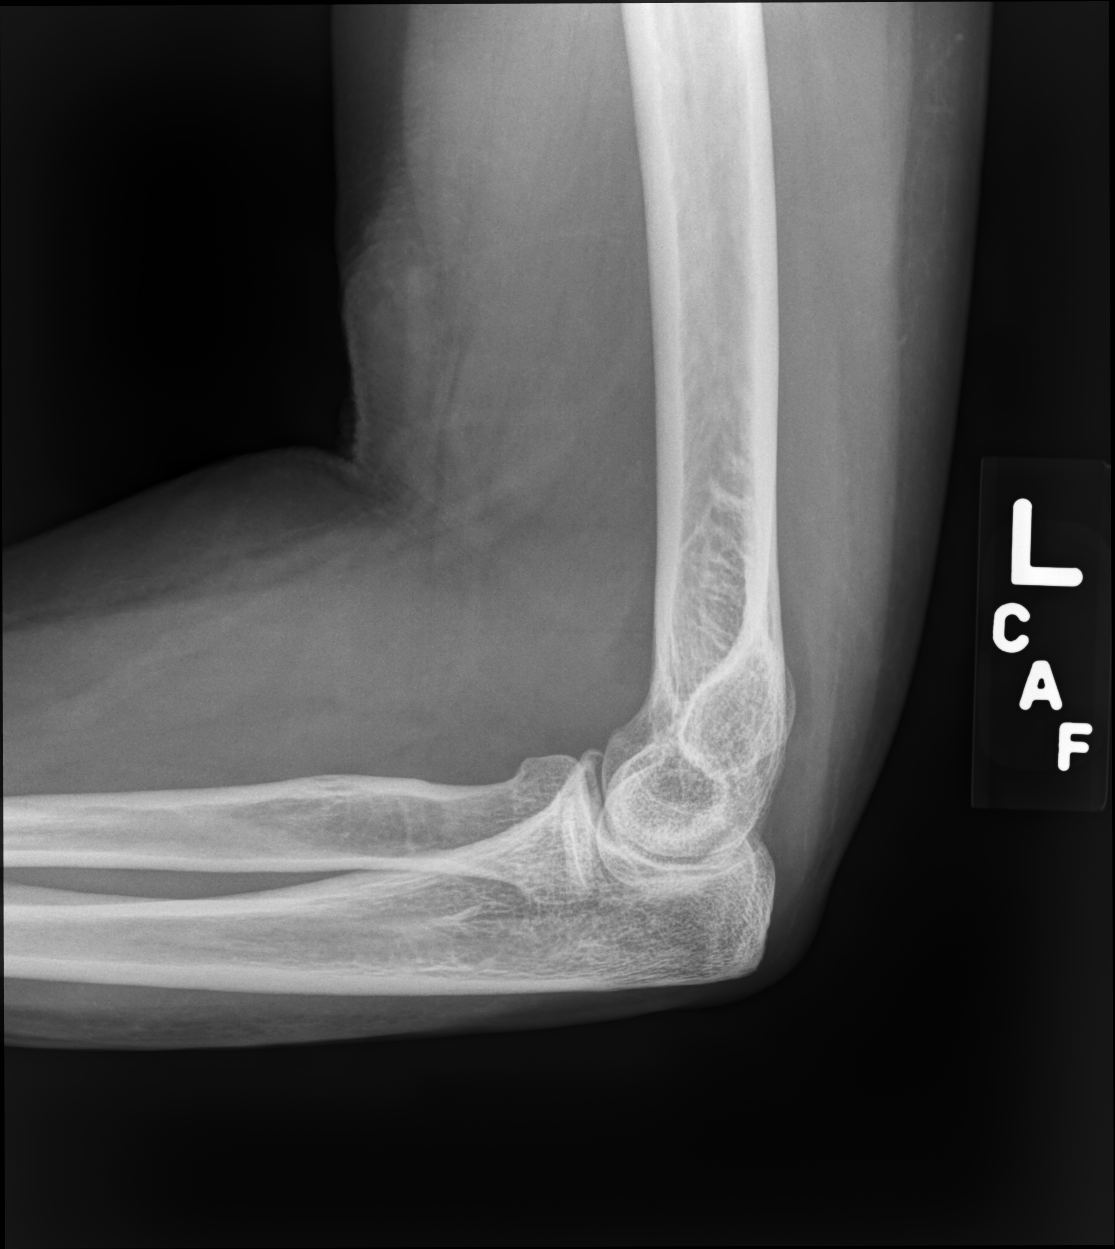

[4 of 4 positions shown; findings below may reference images not displayed]

FINDINGS: Left elbow joint effusion again noted. Slight sclerosis noted in the
radial neck region, possibly healing fracture. No subluxation or
dislocation.
IMPRESSION: Continued left elbow joint effusion. Probable healing nondisplaced
radial neck fracture.

## 2018-11-12 ENCOUNTER — Ambulatory Visit (INDEPENDENT_AMBULATORY_CARE_PROVIDER_SITE_OTHER): Payer: Self-pay | Admitting: Internal Medicine

## 2018-11-12 ENCOUNTER — Ambulatory Visit: Payer: Self-pay | Admitting: Internal Medicine

## 2018-11-12 ENCOUNTER — Encounter: Payer: Self-pay | Admitting: Internal Medicine

## 2018-11-12 ENCOUNTER — Other Ambulatory Visit: Payer: Self-pay

## 2018-11-12 DIAGNOSIS — R350 Frequency of micturition: Secondary | ICD-10-CM

## 2018-11-12 DIAGNOSIS — R509 Fever, unspecified: Secondary | ICD-10-CM

## 2018-11-12 MED ORDER — CIPROFLOXACIN HCL 500 MG PO TABS: 500.0000 mg | ORAL_TABLET | Freq: Two times a day (BID) | ORAL | 0 refills | Status: DC

## 2018-11-12 NOTE — Telephone Encounter (Signed)
Pt called in c/o frequency with urination.   Fever 101.3.     Her husband recently had a UTI with these same symptoms and was on antibiotics.   He is better now but now she is feeling bad.  I let her know due to the coronavirus pandemic that Dr. Fabian Sharp is doing video chat/phone call visits. She was agreeable to this.   I verified her phone # and e mail address .  They are correct.  I forwarded these notes to Dr Arrie Aran for further disposition.    Reason for Disposition . Urinating more frequently than usual (i.e., frequency)  Answer Assessment - Initial Assessment Questions 1. SYMPTOM: "What's the main symptom you're concerned about?" (e.g., frequency, incontinence)     Fever 101.3.  Headache.   I got sick last night from the reflux which I have.   My husband had a UTI with the same symptoms that I'm having.  He was on antibiotics.    2. ONSET: "When did the  urgency  start?"     No burning.   Frequency  last 3 days.  I have some residual from being sick last night.   I vomited.   3. PAIN: "Is there any pain?" If so, ask: "How bad is it?" (Scale: 1-10; mild, moderate, severe)     See above. 4. CAUSE: "What do you think is causing the symptoms?"     UTI   My husband recently had one and was on antibiotics. 5. OTHER SYMPTOMS: "Do you have any other symptoms?" (e.g., fever, flank pain, blood in urine, pain with urination)     Fever 101.3   No other symptoms except frequency. 6. PREGNANCY: "Is there any chance you are pregnant?" "When was your last menstrual period?"     No  Protocols used: URINARY [facility]

## 2018-11-12 NOTE — Telephone Encounter (Signed)
Patient scheduled for Doxy[facility] today at 2:45 PM

## 2018-11-12 NOTE — Progress Notes (Signed)
Virtual Visit via Video Note  I connected with@ on 11/12/18 at  2:45 PM EDT by a video enabled telemedicine application and verified that I am speaking with the correct person using two identifiers. Location patient: home Location provider:work or home office Persons participating in the virtual visit: patient, provider  [place] national recommendations  regarding COVID 19 pandemic   video visit is advised over in office visit for this patient.  Discussed the limitations of evaluation and management by telemedicine and  availability of in person appointments. The patient expressed understanding and agreed to proceed.   HPI: Melissa Walters Request video visit today for possible UTI. She has had some urinary frequency that is quite significant over the last 3 to 4 days and then last night developed fever and chills and stomach upset.  Her temperature went up to 101.3 using Tylenol.  She denies serious abdominal pain flank pain or hematuria says some of her GI symptoms could be related to her previous gastro-problems.  She is able to take fluids.  Recently she has had to go about 15 times in the last few hours.  In January she had a UTI symptoms out of state visiting her mom we send in an antibiotic I believe Macrobid which helped her symptoms to resolve a few weeks ago her husband had a UTI that was showed a positive test and he is better.   She denies recent travel cough respiratory symptoms exposure to COVID-19 that is known.  ROS: See pertinent positives and negatives per HPI.  No change in her medicine rash diarrhea has not checked her blood pressure recently but had been okay.  Past Medical History:  Diagnosis Date  . ADD (attention deficit disorder)    poss  . Depression   . Mood disorder (HCC)   . Psoriasis   . Psoriatic arthritis (HCC) 10/2012    Past Surgical History:  Procedure Laterality Date  . BUNIONECTOMY     bone spur left foot  . ENDOMETRIAL ABLATION    . UPPER  GI ENDOSCOPY  06/16/2018   see report    Family History  Problem Relation Age of Onset  . Anxiety disorder Unknown   . Diabetes Father   . Glaucoma Father   . Myasthenia gravis Mother   . Other Mother        [place]  . Other Brother        devic disease NMO neuromyelitis optica  . Hashimoto's thyroiditis Daughter   . Psoriasis Sister   . Depression Sister   . Arthritis Unknown        parents     SOCIAL HX:    Current Outpatient Medications:  .  aspirin EC 81 MG tablet, Take 81 mg by mouth daily., Disp: , Rfl:  .  carbamazepine (TEGRETOL XR) 200 MG 12 hr tablet, Take 3 tablets by mouth daily., Disp: , Rfl: 3 .  ciprofloxacin (CIPRO) 500 MG tablet, Take 1 tablet (500 mg total) by mouth 2 (two) times daily. For uti with fever, Disp: 20 tablet, Rfl: 0 .  dexlansoprazole (DEXILANT) 60 MG capsule, Take 60 mg by mouth daily., Disp: , Rfl:  .  lamoTRIgine (LAMICTAL) 100 MG tablet, Take 200 mg by mouth daily., Disp: , Rfl:  .  lamoTRIgine (LAMICTAL) 200 MG tablet, Take 2 tablets by mouth daily., Disp: , Rfl: 4 .  losartan (COZAAR) 100 MG tablet, Take 1 tablet (100 mg total) by mouth daily., Disp: 30 tablet, Rfl: 11 .  nitrofurantoin, macrocrystal-monohydrate, (MACROBID) 100 MG capsule, Take 1 capsule (100 mg total) by mouth 2 (two) times daily., Disp: 10 capsule, Rfl: 0 .  sucralfate (CARAFATE) 1 g tablet, , Disp: , Rfl: 0 .  traZODone (DESYREL) 50 MG tablet, Take 50-150 mg by mouth at bedtime as needed for sleep. , Disp: , Rfl: 3 .  zolpidem (AMBIEN) 5 MG tablet, Take 1 tablet (5 mg total) by mouth at bedtime as needed for sleep., Disp: 15 tablet, Rfl: 0  EXAM:  VITALS per patient if applicable: She appears nontoxic but is laying in bed normal respiration and speech looks mildly ill but nontoxic.  GENERAL: alert, oriented, appears well and in no acute distress  HEENT: atraumatic, conjunttiva clear, no obvious abnormalities on inspection of external nose and ears  NECK: normal  movements of the head and neck Skin shows normal turgor and normal capillary refill. LUNGS: on inspection no signs of respiratory distress, breathing rate appears normal, no obvious gross SOB, gasping or wheezing CV: no obvious cyanosis MS: moves all visible extremities without noticeable abnormality  PSYCH/NEURO: pleasant and cooperative, no obvious depression or anxiety, speech and thought processing grossly intact  ASSESSMENT AND PLAN:  Discussed the following assessment and plan:  Fever and chills  Urinary frequency  Suspected UTI UTI symptoms with fever and chills.  Suspect pyelonephritis. No easy way on Friday afternoon in the Covid19  epidemic to get a urine culture without risk. Discussed risk benefit of antibiotics broad-spectrum she will get her family to get her uro sticks to at least get a dipstick urine at home and begin on Cipro 500 twice daily. Risk-benefit discussed.  Expect fever to be gone in 48 hours if it is not improving will need a urine culture and after covid 19 restrictions   Lifted  Need to  ensure that her urine is clear. Need to recheck  Phone or other contact  Next week  . Seek emergent care with alarm findings    Expectant management and discussion of plan and treatment with patient with opportunity to ask questions and all were answered. The patient agreed with the plan and demonstrated an understanding of the instructions.   The patient was advised to call back or seek an in-person evaluation if worsening having concerns    or if the condition fails to improve as anticipated.   Berniece Andreas, MD

## 2018-11-13 ENCOUNTER — Other Ambulatory Visit: Payer: Self-pay | Admitting: Cardiovascular Disease

## 2018-11-16 NOTE — Progress Notes (Signed)
Pt states she has been doing better and no fever since Sunday and uti symptoms have subsided

## 2018-12-23 DIAGNOSIS — F3181 Bipolar II disorder: Secondary | ICD-10-CM | POA: Diagnosis not present

## 2018-12-27 ENCOUNTER — Encounter: Payer: Self-pay | Admitting: Internal Medicine

## 2019-01-21 NOTE — Progress Notes (Signed)
Chief Complaint  Patient presents with   Annual Exam    pt is concerned about weight    Medication Management   Follow-up    HPI: [name]  53 y.o. comes in today for Preventive Health Care visit  And  Fu   Got better from prob renal infection but still gets end urinary dysuria at times .  Fatigue and hard to lose weight   Poss from  Psych meds but seem  To have other optima.  Arthritis about the same reluctant to use other meds   Psoriasis about the same   bp doing well  Health Maintenance  Topic Date Due   Hepatitis C Screening  1965-08-20   HIV Screening  06/11/1979   MAMMOGRAM  07/11/2018   INFLUENZA VACCINE  03/12/2019   PAP SMEAR-Modifier  07/31/2019   TETANUS/TDAP  09/08/2021   COLONOSCOPY  06/16/2028   Health Maintenance Review LIFESTYLE:  Exercise:  Motivation dec not much  Tobacco/ETS: no Alcohol:  1  Per night more on weekend  Sugar beverages: no Sleep: about 6   Drug use: no HH of  5 w  Adult  children  Work:part time [employer].  In office ocass   ROS:  Fatigue   GEN/ HEENT: No fever, significant weight changes sweats headaches vision problems hearing changes, CV/ PULM; No chest pain shortness of breath cough, syncope,edema  change in exercise tolerance. GI /GU: No adominal pain, vomiting, change in bowel habits. No blood in the stool. . SKIN/HEME: ,no acute skin rashes suspicious lesions or bleeding. No lymphadenopathy, nodules, masses.  NEURO/ PSYCH:  No neurologic signs such as weakness numbness. No depression anxiety. IMM/ Allergy: No unusual infections.  Allergy .   REST of 12 system review negative except as per HPI   Past Medical History:  Diagnosis Date   ADD (attention deficit disorder)    poss   Depression    Mood disorder ([place])    Psoriasis    Psoriatic arthritis ([place]) 10/2012    Past Surgical History:  Procedure Laterality Date   BUNIONECTOMY     bone spur left foot   ENDOMETRIAL ABLATION       UPPER GI ENDOSCOPY  06/16/2018   see report    Family History  Problem Relation Age of Onset   Anxiety disorder Unknown    Diabetes Father    Glaucoma Father    Myasthenia gravis Mother    Other Mother        PA   Other Brother        devic disease NMO neuromyelitis optica   Hashimoto's thyroiditis Daughter    Psoriasis Sister    Depression Sister    Arthritis Unknown        parents     Social History   Socioeconomic History   Marital status: Married    Spouse name: Not on file   Number of children: Not on file   Years of education: Not on file   Highest education level: Not on file  Occupational History   Not on file  Social Needs   Financial resource strain: Not on file   Food insecurity    Worry: Not on file    Inability: Not on file   Transportation needs    Medical: Not on file    Non-medical: Not on file  Tobacco Use   Smoking status: Never Smoker   Smokeless tobacco: Never Used  Substance and Sexual Activity  Alcohol use: Yes    Comment: Socially    Drug use: No   Sexual activity: Yes  Lifestyle   Physical activity    Days per week: Not on file    Minutes per session: Not on file   Stress: Not on file  Relationships   Social connections    Talks on phone: Not on file    Gets together: Not on file    Attends religious service: Not on file    Active member of club or organization: Not on file    Attends meetings of clubs or organizations: Not on file    Relationship status: Not on file  Other Topics Concern   Not on file  Social History Narrative    Married   No caffeine   Managing HH   Of 4    Pet dog   Regular exercise- yes  Not much in winter    BA degree       Outpatient Medications Prior to Visit  Medication Sig Dispense Refill   aspirin EC 81 MG tablet Take 81 mg by mouth daily.     carbamazepine (TEGRETOL XR) 200 MG 12 hr tablet Take 3 tablets by mouth daily.  3   dexlansoprazole (DEXILANT) 60  MG capsule Take 60 mg by mouth daily.     lamoTRIgine (LAMICTAL) 100 MG tablet Take 200 mg by mouth daily.     lamoTRIgine (LAMICTAL) 200 MG tablet Take 2 tablets by mouth daily.  4   losartan (COZAAR) 100 MG tablet TAKE ONE TABLET BY MOUTH EVERY DAY 30 tablet 2   omeprazole (PRILOSEC) 40 MG capsule      sucralfate (CARAFATE) 1 g tablet   0   traZODone (DESYREL) 50 MG tablet Take 50-150 mg by mouth at bedtime as needed for sleep.   3   zolpidem (AMBIEN) 5 MG tablet Take 1 tablet (5 mg total) by mouth at bedtime as needed for sleep. 15 tablet 0   nitrofurantoin, macrocrystal-monohydrate, (MACROBID) 100 MG capsule Take 1 capsule (100 mg total) by mouth 2 (two) times daily. 10 capsule 0   ciprofloxacin (CIPRO) 500 MG tablet Take 1 tablet (500 mg total) by mouth 2 (two) times daily. For uti with fever 20 tablet 0   No facility-administered medications prior to visit.      EXAM:  BP 132/84 (BP Location: Right Arm, Patient Position: Sitting, Cuff Size: Large)    Pulse 85    Temp 97.8 F (36.6 C) (Temporal)    Ht 5' 6.5" (1.689 m)    Wt 194 lb 6.4 oz (88.2 kg)    SpO2 97%    BMI 30.91 kg/m   Body mass index is 30.91 kg/m. Wt Readings from Last 3 Encounters:  01/24/19 194 lb 6.4 oz (88.2 kg)  07/27/18 194 lb (88 kg)  07/13/18 190 lb (86.2 kg)   BP Readings from Last 3 Encounters:  01/24/19 132/84  07/27/18 128/90  07/13/18 124/80    Physical Exam: Vital signs reviewed ZOX:WRUEGEN:This is a well-developed well-nourished alert cooperative    who appearsr stated age in no acute distress.  HEENT: normocephalic atraumatic , Eyes: PERRL EOM's full, conjunctiva clear, Nares: paten,t no deformity discharge or tenderness., Ears: no deformity EAC's clear TMs with normal landmarks. Mouth:defered  Mask NECK: supple without masses, thyroid palpable or bruits. CHEST/PULM:  Clear to auscultation and percussion breath sounds equal no wheeze , rales or rhonchi. No chest wall deformities or  tenderness. Breast: normal by inspection .  No dimpling, discharge, masses, tenderness or discharge . CV: PMI is nondisplaced, S1 S2 no gallops, murmurs, rubs. Peripheral pulses are full without delay.No JVD .  ABDOMEN: Bowel sounds normal nontender  No guard or rebound, no hepato splenomegal no CVA tenderness.  No hernia. Extremtities:  No clubbing cyanosis or edema, no acute joint swelling or redness no focal atrophy some  Arthritis changes on hands   NEURO:  Oriented x3, cranial nerves 3-12 appear to be intact, no obvious focal weakness,gait within normal limits no abnormal reflexes or asymmetrical SKIN: No acute rashes normal turgor, color, no bruising or petechiae. PSYCH: Oriented, good eye contact, no obvious depression anxiety, cognition and judgment appear normal. LN: no cervical axillary  adenopathy  Lab Results  Component Value Date   WBC 3.4 (L) 01/24/2019   HGB 11.9 (L) 01/24/2019   HCT 35.2 (L) 01/24/2019   PLT 230.0 01/24/2019   GLUCOSE 79 01/24/2019   CHOL 239 (H) 01/24/2019   TRIG 60.0 01/24/2019   HDL 143.30 01/24/2019   LDLDIRECT 51.0 09/16/2012   LDLCALC 83 01/24/2019   ALT 12 01/24/2019   AST 15 01/24/2019   NA 129 (L) 01/24/2019   K 4.5 01/24/2019   CL 94 (L) 01/24/2019   CREATININE 0.59 01/24/2019   BUN 10 01/24/2019   CO2 28 01/24/2019   TSH 0.70 01/24/2019   HGBA1C 5.3 01/24/2019    BP Readings from Last 3 Encounters:  01/24/19 132/84  07/27/18 128/90  07/13/18 124/80     ASSESSMENT AND PLAN:  Discussed the following assessment and plan:   ICD-10-CM   1. Visit for preventive health examination  Z00.00 Basic metabolic panel    CBC with Differential/Platelet    Hepatic function panel    Lipid panel    TSH    T4, free    POCT Urinalysis Dipstick (Automated)    Urine Culture  2. Medication management  Z79.899 Basic metabolic panel    CBC with Differential/Platelet    Hepatic function panel    Lipid panel    TSH    T4, free    POCT  Urinalysis Dipstick (Automated)    Urine Culture  3. Essential hypertension  I10 Basic metabolic panel    CBC with Differential/Platelet    Hepatic function panel    Lipid panel    TSH    T4, free    POCT Urinalysis Dipstick (Automated)    Urine Culture  4. Goiter  E04.9 Basic metabolic panel    CBC with Differential/Platelet    Hepatic function panel    Lipid panel    TSH    T4, free    POCT Urinalysis Dipstick (Automated)    Urine Culture  5. Fasting hyperglycemia  R73.01 Basic metabolic panel    CBC with Differential/Platelet    Hepatic function panel    Lipid panel    TSH    T4, free    POCT Urinalysis Dipstick (Automated)    Hemoglobin A1c    Urine Culture  6. Dysuria  R30.0 POCT Urinalysis Dipstick (Automated)    Urine Culture  7. Psoriasis w arthritis  L40.9    consdier rrheum reeval if contributing to well being  8. Elevated serum cholesterol high HDL  E78.9    hiv hep c not needed as donates blood      Due for  mammo  Fu UA   Low sodium  Plan form completion  Patient Care Team: Alexys Gassett, Neta MendsWanda K, MD as PCP - General  Tonny Bollman, MD as PCP - Cardiology (Cardiology) Cherly Hensen, MD (Psychiatry) Levi Aland, MD as Attending Physician (Obstetrics and Gynecology) Charna Elizabeth, MD as Consulting Physician (Gastroenterology) Patient Instructions   Try small steps   For healthy weight loss  consdier evalu for sleep apnea oif needed  Consider reassessment for psoriatic arthritis sx that could cause fatigue and low well being  But would  Work on life style first.   Will notify you  of labs when available.    Preventing Unhealthy Weight Gain, Adult Staying at a healthy weight is important to your overall health. When fat builds up in your body, you may become overweight or obese. Being overweight or obese increases your risk of developing certain health problems, such as heart disease, diabetes, sleeping problems, joint problems, and some types of  cancer. Unhealthy weight gain is often the result of making unhealthy food choices or not getting enough exercise. You can make changes to your lifestyle to prevent obesity and stay as healthy as possible. What nutrition changes can be made?   Eat only as much as your body needs. To do this: ? Pay attention to signs that you are hungry or full. Stop eating as soon as you feel full. ? If you feel hungry, try drinking water first before eating. Drink enough water so your urine is clear or pale yellow. ? Eat smaller portions. Pay attention to portion sizes when eating out. ? Look at serving sizes on food labels. Most foods contain more than one serving per container. ? Eat the recommended number of calories for your gender and activity level. For most active people, a daily total of 2,000 calories is appropriate. If you are trying to lose weight or are not very active, you may need to eat fewer calories. Talk with your health care provider or a diet and nutrition specialist (dietitian) about how many calories you need each day.  Choose healthy foods, such as: ? Fruits and vegetables. At each meal, try to fill at least half of your plate with fruits and vegetables. ? Whole grains, such as whole-wheat bread, brown rice, and quinoa. ? Lean meats, such as chicken or fish. ? Other healthy proteins, such as beans, eggs, or tofu. ? Healthy fats, such as nuts, seeds, fatty fish, and olive oil. ? Low-fat or fat-free dairy products.  Check food labels, and avoid food and drinks that: ? Are high in calories. ? Have added sugar. ? Are high in sodium. ? Have saturated fats or trans fats.  Cook foods in healthier ways, such as by baking, broiling, or grilling.  Make a meal plan for the week, and shop with a grocery list to help you stay on track with your purchases. Try to avoid going to the grocery store when you are hungry.  When grocery shopping, try to shop around the outside of the store first,  where the fresh foods are. Doing this helps you to avoid prepackaged foods, which can be high in sugar, salt (sodium), and fat. What lifestyle changes can be made?   Exercise for 30 or more minutes on 5 or more days each week. Exercising may include brisk walking, yard work, biking, running, swimming, and team sports like basketball and soccer. Ask your health care provider which exercises are safe for you.  Do muscle-strengthening activities, such as lifting weights or using resistance bands, on 2 or more days a week.  Do not use any products that contain nicotine or tobacco,  such as cigarettes and e-cigarettes. If you need help quitting, ask your health care provider.  Limit alcohol intake to no more than 1 drink a day for nonpregnant women and 2 drinks a day for men. One drink equals 12 oz of beer, 5 oz of wine, or 1 oz of hard liquor.  Try to get 7-9 hours of sleep each night. What other changes can be made?  Keep a food and activity journal to keep track of: ? What you ate and how many calories you had. Remember to count the calories in sauces, dressings, and side dishes. ? Whether you were active, and what exercises you did. ? Your calorie, weight, and activity goals.  Check your weight regularly. Track any changes. If you notice you have gained weight, make changes to your diet or activity routine.  Avoid taking weight-loss medicines or supplements. Talk to your health care provider before starting any new medicine or supplement.  Talk to your health care provider before trying any new diet or exercise plan. Why are these changes important? Eating healthy, staying active, and having healthy habits can help you to prevent obesity. Those changes also:  Help you manage stress and emotions.  Help you connect with friends and family.  Improve your self-esteem.  Improve your sleep.  Prevent long-term health problems. What can happen if changes are not made? Being obese or  overweight can cause you to develop joint or bone problems, which can make it hard for you to stay active or do activities you enjoy. Being obese or overweight also puts stress on your heart and lungs and can lead to health problems like diabetes, heart disease, and some cancers. Where to find more information Talk with your health care provider or a dietitian about healthy eating and healthy lifestyle choices. You may also find information from:  U.S. Department of Agriculture, MyPlate: https://ball-collins.biz/www.choosemyplate.gov  American Heart Association: www.heart.org  Centers for Disease Control and Prevention: FootballExhibition.com.brwww.cdc.gov Summary  Staying at a healthy weight is important to your overall health. It helps you to prevent certain diseases and health problems, such as heart disease, diabetes, joint problems, sleep disorders, and some types of cancer.  Being obese or overweight can cause you to develop joint or bone problems, which can make it hard for you to stay active or do activities you enjoy.  You can prevent unhealthy weight gain by eating a healthy diet, exercising regularly, not smoking, limiting alcohol, and getting enough sleep.  Talk with your health care provider or a dietitian for guidance about healthy eating and healthy lifestyle choices. This information is not intended to replace advice given to you by your health care provider. Make sure you discuss any questions you have with your health care provider. Document Released: 07/29/2016 Document Revised: 05/08/2017 Document Reviewed: 09/03/2016 Elsevier Interactive Patient Education  2019 [employer]    CopeWanda K. Ladd Cen M.D.

## 2019-01-24 ENCOUNTER — Encounter: Payer: Self-pay | Admitting: Internal Medicine

## 2019-01-24 ENCOUNTER — Ambulatory Visit (INDEPENDENT_AMBULATORY_CARE_PROVIDER_SITE_OTHER): Payer: Self-pay | Admitting: Internal Medicine

## 2019-01-24 ENCOUNTER — Other Ambulatory Visit: Payer: Self-pay

## 2019-01-24 VITALS — BP 132/84 | HR 85 | Temp 97.8°F | Ht 66.5 in | Wt 194.4 lb

## 2019-01-24 DIAGNOSIS — Z79899 Other long term (current) drug therapy: Secondary | ICD-10-CM | POA: Diagnosis not present

## 2019-01-24 DIAGNOSIS — E049 Nontoxic goiter, unspecified: Secondary | ICD-10-CM

## 2019-01-24 DIAGNOSIS — E789 Disorder of lipoprotein metabolism, unspecified: Secondary | ICD-10-CM

## 2019-01-24 DIAGNOSIS — I10 Essential (primary) hypertension: Secondary | ICD-10-CM

## 2019-01-24 DIAGNOSIS — R300 Dysuria: Secondary | ICD-10-CM

## 2019-01-24 DIAGNOSIS — L409 Psoriasis, unspecified: Secondary | ICD-10-CM

## 2019-01-24 DIAGNOSIS — Z0000 Encounter for general adult medical examination without abnormal findings: Secondary | ICD-10-CM

## 2019-01-24 DIAGNOSIS — E7800 Pure hypercholesterolemia, unspecified: Secondary | ICD-10-CM

## 2019-01-24 DIAGNOSIS — R7301 Impaired fasting glucose: Secondary | ICD-10-CM

## 2019-01-24 LAB — CBC WITH DIFFERENTIAL/PLATELET
Basophils Absolute: 0 10*3/uL (ref 0.0–0.1)
Basophils Relative: 1.4 % (ref 0.0–3.0)
Eosinophils Absolute: 0.2 10*3/uL (ref 0.0–0.7)
Eosinophils Relative: 6.2 % — ABNORMAL HIGH (ref 0.0–5.0)
HCT: 35.2 % — ABNORMAL LOW (ref 36.0–46.0)
Hemoglobin: 11.9 g/dL — ABNORMAL LOW (ref 12.0–15.0)
Lymphocytes Relative: 35 % (ref 12.0–46.0)
Lymphs Abs: 1.2 10*3/uL (ref 0.7–4.0)
MCHC: 33.9 g/dL (ref 30.0–36.0)
MCV: 90.6 fl (ref 78.0–100.0)
Monocytes Absolute: 0.4 10*3/uL (ref 0.1–1.0)
Monocytes Relative: 11.5 % (ref 3.0–12.0)
Neutro Abs: 1.5 10*3/uL (ref 1.4–7.7)
Neutrophils Relative %: 45.9 % (ref 43.0–77.0)
Platelets: 230 10*3/uL (ref 150.0–400.0)
RBC: 3.88 Mil/uL (ref 3.87–5.11)
RDW: 13.3 % (ref 11.5–15.5)
WBC: 3.4 10*3/uL — ABNORMAL LOW (ref 4.0–10.5)

## 2019-01-24 LAB — BASIC METABOLIC PANEL
BUN: 10 mg/dL (ref 6–23)
CO2: 28 mEq/L (ref 19–32)
Calcium: 8.9 mg/dL (ref 8.4–10.5)
Chloride: 94 mEq/L — ABNORMAL LOW (ref 96–112)
Creatinine, Ser: 0.59 mg/dL (ref 0.40–1.20)
GFR: 105.97 mL/min (ref >60.00)
Glucose, Bld: 79 mg/dL (ref 70–99)
Potassium: 4.5 mEq/L (ref 3.5–5.1)
Sodium: 129 mEq/L — ABNORMAL LOW (ref 135–145)

## 2019-01-24 LAB — HEPATIC FUNCTION PANEL
ALT: 12 U/L (ref 0–35)
AST: 15 U/L (ref 0–37)
Albumin: 4.3 g/dL (ref 3.5–5.2)
Alkaline Phosphatase: 88 U/L (ref 39–117)
Bilirubin, Direct: 0.1 mg/dL (ref 0.0–0.3)
Total Bilirubin: 0.3 mg/dL (ref 0.2–1.2)
Total Protein: 6.4 g/dL (ref 6.0–8.3)

## 2019-01-24 LAB — POC URINALSYSI DIPSTICK (AUTOMATED)
Bilirubin, UA: NEGATIVE
Blood, UA: NEGATIVE
Glucose, UA: NEGATIVE
Ketones, UA: NEGATIVE
Leukocytes, UA: NEGATIVE
Nitrite, UA: NEGATIVE
Protein, UA: POSITIVE — AB
Spec Grav, UA: 1.01 (ref 1.010–1.025)
Urobilinogen, UA: 0.2 E.U./dL
pH, UA: 8.5 — AB (ref 5.0–8.0)

## 2019-01-24 LAB — LIPID PANEL
Cholesterol: 239 mg/dL — ABNORMAL HIGH (ref 0–200)
HDL: 143.3 mg/dL (ref >39.00)
LDL Cholesterol: 83 mg/dL (ref 0–99)
NonHDL: 95.26
Total CHOL/HDL Ratio: 2
Triglycerides: 60 mg/dL (ref 0.0–149.0)
VLDL: 12 mg/dL (ref 0.0–40.0)

## 2019-01-24 LAB — T4, FREE: Free T4: 0.87 ng/dL (ref 0.60–1.60)

## 2019-01-24 LAB — HEMOGLOBIN A1C: Hgb A1c MFr Bld: 5.3 % (ref 4.6–6.5)

## 2019-01-24 LAB — TSH: TSH: 0.7 u[IU]/mL (ref 0.35–4.50)

## 2019-01-24 NOTE — Patient Instructions (Addendum)
Try small steps   For healthy weight loss  consdier evalu for sleep apnea oif needed  Consider reassessment for psoriatic arthritis sx that could cause fatigue and low well being  But would  Work on life style first.   Will notify you  of labs when available.    Preventing Unhealthy Weight Gain, Adult Staying at a healthy weight is important to your overall health. When fat builds up in your body, you may become overweight or obese. Being overweight or obese increases your risk of developing certain health problems, such as heart disease, diabetes, sleeping problems, joint problems, and some types of cancer. Unhealthy weight gain is often the result of making unhealthy food choices or not getting enough exercise. You can make changes to your lifestyle to prevent obesity and stay as healthy as possible. What nutrition changes can be made?   Eat only as much as your body needs. To do this: ? Pay attention to signs that you are hungry or full. Stop eating as soon as you feel full. ? If you feel hungry, try drinking water first before eating. Drink enough water so your urine is clear or pale yellow. ? Eat smaller portions. Pay attention to portion sizes when eating out. ? Look at serving sizes on food labels. Most foods contain more than one serving per container. ? Eat the recommended number of calories for your gender and activity level. For most active people, a daily total of 2,000 calories is appropriate. If you are trying to lose weight or are not very active, you may need to eat fewer calories. Talk with your health care provider or a diet and nutrition specialist (dietitian) about how many calories you need each day.  Choose healthy foods, such as: ? Fruits and vegetables. At each meal, try to fill at least half of your plate with fruits and vegetables. ? Whole grains, such as whole-wheat bread, brown rice, and quinoa. ? Lean meats, such as chicken or fish. ? Other healthy proteins,  such as beans, eggs, or tofu. ? Healthy fats, such as nuts, seeds, fatty fish, and olive oil. ? Low-fat or fat-free dairy products.  Check food labels, and avoid food and drinks that: ? Are high in calories. ? Have added sugar. ? Are high in sodium. ? Have saturated fats or trans fats.  Cook foods in healthier ways, such as by baking, broiling, or grilling.  Make a meal plan for the week, and shop with a grocery list to help you stay on track with your purchases. Try to avoid going to the grocery store when you are hungry.  When grocery shopping, try to shop around the outside of the store first, where the fresh foods are. Doing this helps you to avoid prepackaged foods, which can be high in sugar, salt (sodium), and fat. What lifestyle changes can be made?   Exercise for 30 or more minutes on 5 or more days each week. Exercising may include brisk walking, yard work, biking, running, swimming, and team sports like basketball and soccer. Ask your health care provider which exercises are safe for you.  Do muscle-strengthening activities, such as lifting weights or using resistance bands, on 2 or more days a week.  Do not use any products that contain nicotine or tobacco, such as cigarettes and e-cigarettes. If you need help quitting, ask your health care provider.  Limit alcohol intake to no more than 1 drink a day for nonpregnant women and 2 drinks a day  for men. One drink equals 12 oz of beer, 5 oz of wine, or 1 oz of hard liquor.  Try to get 7-9 hours of sleep each night. What other changes can be made?  Keep a food and activity journal to keep track of: ? What you ate and how many calories you had. Remember to count the calories in sauces, dressings, and side dishes. ? Whether you were active, and what exercises you did. ? Your calorie, weight, and activity goals.  Check your weight regularly. Track any changes. If you notice you have gained weight, make changes to your diet or  activity routine.  Avoid taking weight-loss medicines or supplements. Talk to your health care provider before starting any new medicine or supplement.  Talk to your health care provider before trying any new diet or exercise plan. Why are these changes important? Eating healthy, staying active, and having healthy habits can help you to prevent obesity. Those changes also:  Help you manage stress and emotions.  Help you connect with friends and family.  Improve your self-esteem.  Improve your sleep.  Prevent long-term health problems. What can happen if changes are not made? Being obese or overweight can cause you to develop joint or bone problems, which can make it hard for you to stay active or do activities you enjoy. Being obese or overweight also puts stress on your heart and lungs and can lead to health problems like diabetes, heart disease, and some cancers. Where to find more information Talk with your health care provider or a dietitian about healthy eating and healthy lifestyle choices. You may also find information from:  U.S. Department of Agriculture, MyPlate: FormerBoss.no  American Heart Association: www.heart.org  Centers for Disease Control and Prevention: http://www.wolf.info/ Summary  Staying at a healthy weight is important to your overall health. It helps you to prevent certain diseases and health problems, such as heart disease, diabetes, joint problems, sleep disorders, and some types of cancer.  Being obese or overweight can cause you to develop joint or bone problems, which can make it hard for you to stay active or do activities you enjoy.  You can prevent unhealthy weight gain by eating a healthy diet, exercising regularly, not smoking, limiting alcohol, and getting enough sleep.  Talk with your health care provider or a dietitian for guidance about healthy eating and healthy lifestyle choices. This information is not intended to replace advice given to  you by your health care provider. Make sure you discuss any questions you have with your health care provider. Document Released: 07/29/2016 Document Revised: 05/08/2017 Document Reviewed: 09/03/2016 Elsevier Interactive Patient Education  2019 [employer].

## 2019-01-25 ENCOUNTER — Other Ambulatory Visit: Payer: Self-pay | Admitting: Internal Medicine

## 2019-01-25 DIAGNOSIS — E871 Hypo-osmolality and hyponatremia: Secondary | ICD-10-CM

## 2019-01-26 LAB — URINE CULTURE
MICRO NUMBER:: 569791
SPECIMEN QUALITY:: ADEQUATE

## 2019-01-26 MED ORDER — SULFAMETHOXAZOLE-TRIMETHOPRIM 800-160 MG PO TABS: 1.0000 | ORAL_TABLET | Freq: Two times a day (BID) | ORAL | 0 refills | Status: AC

## 2019-01-26 NOTE — Addendum Note (Signed)
Addended by: Modena Morrow R on: 01/26/2019 02:59 PM   Modules accepted: Orders

## 2019-02-04 ENCOUNTER — Telehealth: Payer: Self-pay | Admitting: *Deleted

## 2019-02-04 NOTE — Telephone Encounter (Signed)
Patient states that this paperwork is due today. Please contact patient directly.

## 2019-02-04 NOTE — Telephone Encounter (Signed)
Copied from [place] [id number]. Topic: General - Inquiry >> Feb 03, 2019  2:00 PM Reyne Dumas L wrote: Reason for CRM:   Pt states she left paperwork at the office when she was seen for her Physical.  States that insurance was requiring information to be filled out and signed by the doctor.  It is due in tomorrow and pt states she hasn't been informed that she can come and pick it up yet. Pt can be reached at [phone]

## 2019-02-04 NOTE — Telephone Encounter (Signed)
Talked to patient, patient is coming to pick up form that has already been faxed also

## 2019-02-10 ENCOUNTER — Other Ambulatory Visit: Payer: Self-pay | Admitting: Cardiovascular Disease

## 2019-03-24 ENCOUNTER — Encounter: Payer: Self-pay | Admitting: Family Medicine

## 2019-03-24 ENCOUNTER — Other Ambulatory Visit: Payer: Self-pay

## 2019-03-24 ENCOUNTER — Telehealth (INDEPENDENT_AMBULATORY_CARE_PROVIDER_SITE_OTHER): Payer: Self-pay | Admitting: Family Medicine

## 2019-03-24 ENCOUNTER — Telehealth: Payer: Self-pay | Admitting: Internal Medicine

## 2019-03-24 ENCOUNTER — Other Ambulatory Visit (INDEPENDENT_AMBULATORY_CARE_PROVIDER_SITE_OTHER): Payer: Self-pay

## 2019-03-24 DIAGNOSIS — E871 Hypo-osmolality and hyponatremia: Secondary | ICD-10-CM | POA: Diagnosis not present

## 2019-03-24 DIAGNOSIS — R300 Dysuria: Secondary | ICD-10-CM

## 2019-03-24 LAB — BASIC METABOLIC PANEL
BUN: 13 mg/dL (ref 6–23)
CO2: 24 mEq/L (ref 19–32)
Calcium: 8.8 mg/dL (ref 8.4–10.5)
Chloride: 96 mEq/L (ref 96–112)
Creatinine, Ser: 0.72 mg/dL (ref 0.40–1.20)
GFR: 84.17 mL/min (ref >60.00)
Glucose, Bld: 97 mg/dL (ref 70–99)
Potassium: 4.3 mEq/L (ref 3.5–5.1)
Sodium: 127 mEq/L — ABNORMAL LOW (ref 135–145)

## 2019-03-24 MED ORDER — NITROFURANTOIN MONOHYD MACRO 100 MG PO CAPS: 100.0000 mg | ORAL_CAPSULE | Freq: Two times a day (BID) | ORAL | 0 refills | Status: DC

## 2019-03-24 NOTE — Telephone Encounter (Signed)
Called office 3 times and was hung up on. PT is having symptoms again of UTI. She has a lab order outstanding ordered by [facility]. She need to come in as soon as possible for labs and wants Dr Regis Bill to know she is having the issue and be looking for the incoming lab work. Pls fu with pt at [phone] ASAP

## 2019-03-24 NOTE — Telephone Encounter (Signed)
Pt scheduled for virtual visit 

## 2019-03-24 NOTE — Progress Notes (Signed)
Virtual Visit via Video Note  I connected with Melissa Walters  on 03/24/19 at  3:00 PM EDT by a video enabled telemedicine application and verified that I am speaking with the correct person using two identifiers.  Location patient: home Location provider:work or home office Persons participating in the virtual visit: patient, provider  I discussed the limitations of evaluation and management by telemedicine and the availability of in person appointments. The patient expressed understanding and agreed to proceed.   HPI:  Acute visit for Dysuria: -for 2 days days -symptoms include frequency, urgency -denies fevers, malaise, abd or flank pain, hematuria, NVD,  -no menstrual periods, s/p ablation -has recurrent UTIs, submitted urine today for some urine studies PCP is doing for ongoing issues, but feels has UTI -last UTI with E. Coli in June - treated with bactrim  ROS: See pertinent positives and negatives per HPI.  Past Medical History:  Diagnosis Date  . ADD (attention deficit disorder)    poss  . Depression   . Mood disorder ([place])   . Psoriasis   . Psoriatic arthritis ([place]) 10/2012    Past Surgical History:  Procedure Laterality Date  . BUNIONECTOMY     bone spur left foot  . ENDOMETRIAL ABLATION    . UPPER GI ENDOSCOPY  06/16/2018   see report    Family History  Problem Relation Age of Onset  . Anxiety disorder Unknown   . Diabetes Father   . Glaucoma Father   . Myasthenia gravis Mother   . Other Mother        [place]  . Other Brother        devic disease NMO neuromyelitis optica  . Hashimoto's thyroiditis Daughter   . Psoriasis Sister   . Depression Sister   . Arthritis Unknown        parents     SOCIAL HX: see hpi   Current Outpatient Medications:  .  aspirin EC 81 MG tablet, Take 81 mg by mouth daily., Disp: , Rfl:  .  carbamazepine (TEGRETOL XR) 200 MG 12 hr tablet, Take 3 tablets by mouth daily., Disp: , Rfl: 3 .  dexlansoprazole (DEXILANT) 60 MG capsule,  Take 60 mg by mouth daily., Disp: , Rfl:  .  lamoTRIgine (LAMICTAL) 100 MG tablet, Take 200 mg by mouth daily., Disp: , Rfl:  .  lamoTRIgine (LAMICTAL) 200 MG tablet, Take 2 tablets by mouth daily., Disp: , Rfl: 4 .  losartan (COZAAR) 100 MG tablet, Take 1 tablet (100 mg total) by mouth daily. Please make annual appt with Dr. Burt Knack for future refills. Thank you. 1st attempt., Disp: 30 tablet, Rfl: 2 .  omeprazole (PRILOSEC) 40 MG capsule, , Disp: , Rfl:  .  sucralfate (CARAFATE) 1 g tablet, , Disp: , Rfl: 0 .  traZODone (DESYREL) 50 MG tablet, Take 50-150 mg by mouth at bedtime as needed for sleep. , Disp: , Rfl: 3 .  zolpidem (AMBIEN) 5 MG tablet, Take 1 tablet (5 mg total) by mouth at bedtime as needed for sleep., Disp: 15 tablet, Rfl: 0 .  nitrofurantoin, macrocrystal-monohydrate, (MACROBID) 100 MG capsule, Take 1 capsule (100 mg total) by mouth 2 (two) times daily., Disp: 14 capsule, Rfl: 0  EXAM:  VITALS per patient if applicable:denies fever  GENERAL: alert, oriented, appears well and in no acute distress  HEENT: atraumatic, conjunttiva clear, no obvious abnormalities on inspection of external nose and ears  NECK: normal movements of the head and neck  LUNGS: on inspection no  signs of respiratory distress, breathing rate appears normal, no obvious gross SOB, gasping or wheezing  CV: no obvious cyanosis  MS: moves all visible extremities without noticeable abnormality  PSYCH/NEURO: pleasant and cooperative, no obvious depression or anxiety, speech and thought processing grossly intact  ASSESSMENT AND PLAN:  Discussed the following assessment and plan:  Dysuria   Suspect recurrent UTI. Patient opted for empiric treatment with Macrobid 100mg  bid x 7 days. Discussed limitations of empiric tx and she agrees to f/u if not resolved. Also, discussed waning estrogen levels, vaginal atrophy as potential etiology of recurrent symptoms and treatment options. Other potential causes and  prevention strategies. She agrees to consider and continue work up with PCP.   I discussed the assessment and treatment plan with the patient. The patient was provided an opportunity to ask questions and all were answered. The patient agreed with the plan and demonstrated an understanding of the instructions.   The patient was advised to call back or seek an in-person evaluation if the symptoms worsen or if the condition fails to improve as anticipated.   Terressa KoyanagiHannah R Takara Sermons, DO

## 2019-03-28 LAB — OSMOLALITY, URINE: Osmolality, Ur: 789 mOsm/kg (ref 50–1200)

## 2019-03-28 LAB — EXTRA URINE SPECIMEN

## 2019-03-28 LAB — SODIUM, URINE, RANDOM: Sodium, Ur: 84 mmol/L (ref 28–272)

## 2019-03-28 LAB — OSMOLALITY: Osmolality: 271 mOsm/kg — ABNORMAL LOW (ref 278–305)

## 2019-05-03 ENCOUNTER — Other Ambulatory Visit: Payer: Self-pay

## 2019-05-03 ENCOUNTER — Ambulatory Visit: Payer: Self-pay | Admitting: *Deleted

## 2019-05-03 DIAGNOSIS — Z20822 Contact with and (suspected) exposure to covid-19: Secondary | ICD-10-CM

## 2019-05-03 NOTE — Telephone Encounter (Signed)
Pt woke up 2 hours ago with lower middle abd pain and diarrhea.   Chills but no fever.   No other symptoms.   She is going to try an OTC remedy so I went over the home care protocol with instructions to call us back if she is not better in 2-3 days.   She also questioned if she should be tested for COVID-19 because having diarrhea is very unusual for her.   I let her know about the testing site on [address] which she was familiar with.    I let her know it's not a bad idea to be tested.   So she's going to be tested.  She was agreeable to the plan of care and to call us back if she is not improving in 2-3 days.    Reason for Disposition . [1] MILD-MODERATE pain AND [2] comes and goes (cramps)  Answer Assessment - Initial Assessment Questions 1. LOCATION: "Where does it hurt?"      I'm having gas pains in lower abd.   Diarrhea for last 2 hours. 2. RADIATION: "Does the pain shoot anywhere else?" (e.g., chest, back)     No    Pain in the center lower abd 3. ONSET: "When did the pain begin?" (e.g., minutes, hours or days ago)      Early this morning.   4. SUDDEN: "Gradual or sudden onset?"     Woke up with it.       5. PATTERN "Does the pain come and go, or is it constant?"    - If constant: "Is it getting better, staying the same, or worsening?"      (Note: Constant means the pain never goes away completely; most serious pain is constant and it progresses)     - If intermittent: "How long does it last?" "Do you have pain now?"     (Note: Intermittent means the pain goes away completely between bouts)     Coming and going.   It feels better after going to the bathrroom. 6. SEVERITY: "How bad is the pain?"  (e.g., Scale 1-10; mild, moderate, or severe)   - MILD (1-3): doesn't interfere with normal activities, abdomen soft and not tender to touch    - MODERATE (4-7): interferes with normal activities or awakens from sleep, tender to touch    - SEVERE (8-10): excruciating pain, doubled  over, unable to do any normal activities      Mild 7. RECURRENT SYMPTOM: "Have you ever had this type of abdominal pain before?" If so, ask: "When was the last time?" and "What happened that time?"      No 8. CAUSE: "What do you think is causing the abdominal pain?"     No   Should I get a COVID-19 test?   My husband's receptionist has COVID-19. 9. RELIEVING/AGGRAVATING FACTORS: "What makes it better or worse?" (e.g., movement, antacids, bowel movement)     Abd pain is better after having the diarrhea.    Not tried any other the counter medications. 10. OTHER SYMPTOMS: "Has there been any vomiting, diarrhea, constipation, or urine problems?"       Chills but no fever.   11. PREGNANCY: "Is there any chance you are pregnant?" "When was your last menstrual period?"       Not asked due to age  Protocols used: ABDOMINAL PAIN - [facility]

## 2019-05-04 LAB — NOVEL CORONAVIRUS, NAA: SARS-CoV-2, NAA: NOT DETECTED

## 2019-05-23 ENCOUNTER — Other Ambulatory Visit: Payer: Self-pay | Admitting: Cardiovascular Disease

## 2019-06-08 ENCOUNTER — Telehealth: Payer: Self-pay | Admitting: Internal Medicine

## 2019-06-08 ENCOUNTER — Other Ambulatory Visit: Payer: Self-pay

## 2019-06-08 DIAGNOSIS — E871 Hypo-osmolality and hyponatremia: Secondary | ICD-10-CM

## 2019-06-08 NOTE — Telephone Encounter (Signed)
Pt would like to have a shingle vaccine is she eligible for one?

## 2019-06-09 DIAGNOSIS — L814 Other melanin hyperpigmentation: Secondary | ICD-10-CM | POA: Diagnosis not present

## 2019-06-09 DIAGNOSIS — Z23 Encounter for immunization: Secondary | ICD-10-CM | POA: Diagnosis not present

## 2019-06-09 DIAGNOSIS — L821 Other seborrheic keratosis: Secondary | ICD-10-CM | POA: Diagnosis not present

## 2019-06-09 DIAGNOSIS — L409 Psoriasis, unspecified: Secondary | ICD-10-CM | POA: Diagnosis not present

## 2019-06-09 DIAGNOSIS — Z85828 Personal history of other malignant neoplasm of skin: Secondary | ICD-10-CM | POA: Diagnosis not present

## 2019-06-09 NOTE — Telephone Encounter (Signed)
lvm for pt to call back to schedule nurse visit pt is eligible for shingles shot.

## 2019-06-10 ENCOUNTER — Other Ambulatory Visit: Payer: Self-pay | Admitting: Cardiovascular Disease

## 2019-06-22 DIAGNOSIS — F3181 Bipolar II disorder: Secondary | ICD-10-CM | POA: Diagnosis not present

## 2019-06-27 ENCOUNTER — Other Ambulatory Visit: Payer: Self-pay | Admitting: Cardiovascular Disease

## 2019-07-04 ENCOUNTER — Other Ambulatory Visit: Payer: Self-pay | Admitting: Cardiovascular Disease

## 2019-07-04 MED ORDER — LOSARTAN POTASSIUM 100 MG PO TABS: ORAL_TABLET | ORAL | 0 refills | Status: DC

## 2019-07-04 NOTE — Telephone Encounter (Signed)
Called pt and left an message informing pt that she was overdue for an appt with Dr. Burt Knack, she could call back and make an appt or refill with her PCP, only a 15 day supply was sent to her pharmacy, asking to make overdue appt.

## 2019-07-05 MED ORDER — LOSARTAN POTASSIUM 100 MG PO TABS: ORAL_TABLET | ORAL | 1 refills | Status: DC

## 2019-07-05 NOTE — Telephone Encounter (Signed)
Ok to fill 30 days refill x 1  Of the losartan 100 mg   make virtual visit with me to decide on other steps and fu .  And BP  Assessment

## 2019-07-13 DIAGNOSIS — J029 Acute pharyngitis, unspecified: Secondary | ICD-10-CM | POA: Diagnosis not present

## 2019-07-13 DIAGNOSIS — Z20828 Contact with and (suspected) exposure to other viral communicable diseases: Secondary | ICD-10-CM | POA: Diagnosis not present

## 2019-07-19 ENCOUNTER — Other Ambulatory Visit: Payer: Self-pay | Admitting: Cardiovascular Disease

## 2019-07-20 ENCOUNTER — Ambulatory Visit: Payer: Self-pay

## 2019-07-27 ENCOUNTER — Other Ambulatory Visit: Payer: Self-pay

## 2019-07-27 ENCOUNTER — Other Ambulatory Visit: Payer: BC Managed Care – PPO

## 2019-07-27 ENCOUNTER — Ambulatory Visit: Payer: BC Managed Care – PPO | Attending: Internal Medicine

## 2019-07-27 DIAGNOSIS — Z20828 Contact with and (suspected) exposure to other viral communicable diseases: Secondary | ICD-10-CM | POA: Diagnosis not present

## 2019-07-27 DIAGNOSIS — Z20822 Contact with and (suspected) exposure to covid-19: Secondary | ICD-10-CM

## 2019-07-29 LAB — NOVEL CORONAVIRUS, NAA: SARS-CoV-2, NAA: NOT DETECTED

## 2019-08-02 ENCOUNTER — Ambulatory Visit: Payer: BLUE CROSS/BLUE SHIELD | Admitting: Internal Medicine

## 2019-08-23 ENCOUNTER — Other Ambulatory Visit: Payer: Self-pay

## 2019-08-24 ENCOUNTER — Ambulatory Visit (INDEPENDENT_AMBULATORY_CARE_PROVIDER_SITE_OTHER): Payer: BC Managed Care – PPO | Admitting: Internal Medicine

## 2019-08-24 ENCOUNTER — Encounter: Payer: Self-pay | Admitting: Internal Medicine

## 2019-08-24 VITALS — BP 128/70 | HR 85 | Temp 97.6°F | Wt 199.2 lb

## 2019-08-24 DIAGNOSIS — E0590 Thyrotoxicosis, unspecified without thyrotoxic crisis or storm: Secondary | ICD-10-CM | POA: Diagnosis not present

## 2019-08-24 DIAGNOSIS — L409 Psoriasis, unspecified: Secondary | ICD-10-CM

## 2019-08-24 DIAGNOSIS — Z79899 Other long term (current) drug therapy: Secondary | ICD-10-CM | POA: Diagnosis not present

## 2019-08-24 DIAGNOSIS — I10 Essential (primary) hypertension: Secondary | ICD-10-CM

## 2019-08-24 DIAGNOSIS — E871 Hypo-osmolality and hyponatremia: Secondary | ICD-10-CM | POA: Diagnosis not present

## 2019-08-24 DIAGNOSIS — E049 Nontoxic goiter, unspecified: Secondary | ICD-10-CM

## 2019-08-24 MED ORDER — LOSARTAN POTASSIUM 100 MG PO TABS: ORAL_TABLET | ORAL | 1 refills | Status: DC

## 2019-08-24 NOTE — Patient Instructions (Signed)
Bp ok to day in oiffice  Continue lifestyle intervention healthy eating and exercise .  Take blood pressure readings twice a day for  7- 10 .Send in readings       If needed we may add medication howeverlifestyle intervention healthy eating and exercise .  May be enough to do further control  Goal BP  Perfect 120/80  Acceptable 130/85   Plan rov depending on readings and  Labs

## 2019-08-24 NOTE — Progress Notes (Signed)
This visit occurred during the SARS-CoV-2 public health emergency.  Safety protocols were in place, including screening questions prior to the visit, additional usage of staff PPE, and extensive cleaning of exam room while observing appropriate contact time as indicated for disinfecting solutions.    Chief Complaint  Patient presents with  . Hypertension    Pt states that her blood pressure has been high when she took it today her blood pressure cuff got 180 for the top number pt states that she is not having any headaces, blurred vision ,or dizziness    HPI: Melissa Walters 54 y.o. come in for Bp evaluation  Was placed on 100 losartan when seen  By cardiology Dr Excell Seltzer   in past for atypical sx that were ok  And no dx  But elevated bp  She has inc weight over holidays and  Other issues   Now using [place] for exercise  Bp Was  up during   holidays .  Other reasons  Readings sometims up to 170/ over 80 and then mostl;y 140 + range   No new cv sx  Mom with Myasthemia Gravis  lives in [place] .   Patient Has rls    No osa .  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  ? Never got  Chem done but med changed after had low sodium level  ROS: See pertinent positives and negatives per HPI. Psoriatic  Worse in winter Hx of stress test low risk  Jan 2019 Past Medical History:  Diagnosis Date  . ADD (attention deficit disorder)    poss  . Depression   . Mood disorder ([facility])   . Psoriasis   . Psoriatic arthritis ([facility]) 10/2012    Family History  Problem Relation Age of Onset  . Anxiety disorder Unknown   . Diabetes Father   . Glaucoma Father   . Myasthenia gravis Mother   . Other Mother        [place]  . Other Brother        devic disease NMO neuromyelitis optica  . Hashimoto's thyroiditis Daughter   . Psoriasis Sister   . Depression Sister   . Arthritis Unknown        parents     Social History   Socioeconomic History  . Marital status:  Married    Spouse name: Not on file  . Number of children: Not on file  . Years of education: Not on file  . Highest education level: Not on file  Occupational History  . Not on file  Tobacco Use  . Smoking status: Never Smoker  . Smokeless tobacco: Never Used  Substance and Sexual Activity  . Alcohol use: Yes    Comment: Socially   . Drug use: No  . Sexual activity: Yes  Other Topics Concern  . Not on file  Social History Narrative    Married   No caffeine   Managing HH   Of 4    Pet dog   Regular exercise- yes  Not much in winter    BA degree      Social Determinants of Health   Financial Resource Strain:   . Difficulty of Paying Living Expenses: Not on file  Food Insecurity:   . Worried About Programme researcher, broadcasting/film/video in the Last Year: Not on file  . Ran Out of Food in the Last Year: Not on  file  Transportation Needs:   . Film/video editor (Medical): Not on file  . Lack of Transportation (Non-Medical): Not on file  Physical Activity:   . Days of Exercise per Week: Not on file  . Minutes of Exercise per Session: Not on file  Stress:   . Feeling of Stress : Not on file  Social Connections:   . Frequency of Communication with Friends and Family: Not on file  . Frequency of Social Gatherings with Friends and Family: Not on file  . Attends Religious Services: Not on file  . Active Member of Clubs or Organizations: Not on file  . Attends Archivist Meetings: Not on file  . Marital Status: Not on file    Outpatient Medications Prior to Visit  Medication Sig Dispense Refill  . aspirin EC 81 MG tablet Take 81 mg by mouth daily.    . carbamazepine (TEGRETOL XR) 200 MG 12 hr tablet Take 3 tablets by mouth daily.  3  . dexlansoprazole (DEXILANT) 60 MG capsule Take 60 mg by mouth daily.    [name] lamoTRIgine (LAMICTAL) 100 MG tablet Take 200 mg by mouth daily.    [name] lamoTRIgine (LAMICTAL) 200 MG tablet Take 2 tablets by mouth daily.  4  . omeprazole (PRILOSEC) 40 MG  capsule     . sucralfate (CARAFATE) 1 g tablet   0  . traZODone (DESYREL) 50 MG tablet Take 50-150 mg by mouth at bedtime as needed for sleep.   3  . zolpidem (AMBIEN) 5 MG tablet Take 1 tablet (5 mg total) by mouth at bedtime as needed for sleep. 15 tablet 0  . losartan (COZAAR) 100 MG tablet TAKE 1 TABLET BY MOUTH DAILY. 30 tablet 1  . nitrofurantoin, macrocrystal-monohydrate, (MACROBID) 100 MG capsule Take 1 capsule (100 mg total) by mouth 2 (two) times daily. (Patient not taking: Reported on 08/24/2019) 14 capsule 0   No facility-administered medications prior to visit.     EXAM:  BP 128/70 (BP Location: Right Arm, Patient Position: Sitting, Cuff Size: Normal)   Pulse 85   Temp 97.6 F (36.4 C) (Temporal)   Wt 199 lb 3.2 oz (90.4 kg)   SpO2 97%   BMI 31.67 kg/m   Body mass index is 31.67 kg/m. Wt Readings from Last 3 Encounters:  08/24/19 199 lb 3.2 oz (90.4 kg)  01/24/19 194 lb 6.4 oz (88.2 kg)  07/27/18 194 lb (88 kg)   BP sitting large  Right 134/78  GENERAL: vitals reviewed and listed above, alert, oriented, appears well hydrated and in no acute distress HEENT: atraumatic, conjunctiva  clear, no obvious abnormalities on inspection of external nose and ears OP :masked  NECK: no obvious masses on inspection palpation  LUNGS: clear to auscultation bilaterally, no wheezes, rales or rhonchi, good air movement CV: HRRR, no clubbing cyanosis or  peripheral edema nl cap refill  Abdomen:  Sof,t normal bowel sounds without hepatosplenomegaly, no guarding rebound or masses no CVA tenderness MS: moves all extremities without noticeable focal  abnormality PSYCH: pleasant and cooperative, no obvious depression or anxiety Lab Results  Component Value Date   WBC 3.4 (L) 01/24/2019   HGB 11.9 (L) 01/24/2019   HCT 35.2 (L) 01/24/2019   PLT 230.0 01/24/2019   GLUCOSE 97 03/24/2019   CHOL 239 (H) 01/24/2019   TRIG 60.0 01/24/2019   HDL 143.30 01/24/2019   LDLDIRECT 51.0 09/16/2012    LDLCALC 83 01/24/2019   ALT 12 01/24/2019   AST 15 01/24/2019  NA 127 (L) 03/24/2019   K 4.3 03/24/2019   CL 96 03/24/2019   CREATININE 0.72 03/24/2019   BUN 13 03/24/2019   CO2 24 03/24/2019   TSH 0.70 01/24/2019   HGBA1C 5.3 01/24/2019   BP Readings from Last 3 Encounters:  08/24/19 128/70  01/24/19 132/84  07/27/18 128/90    ASSESSMENT AND PLAN:  Discussed the following assessment and plan:  Essential hypertension - Plan: Basic metabolic panel, CBC with Differential, TSH, T4, Free (Thyrox)  Medication management - Plan: Basic metabolic panel, CBC with Differential, TSH, T4, Free (Thyrox)  Hyponatremia - prob from med needs updated labs - Plan: Basic metabolic panel, CBC with Differential, TSH, T4, Free (Thyrox)  Subclinical hyperthyroidism - hx of same?   - Plan: Basic metabolic panel, CBC with Differential, TSH, T4, Free (Thyrox)  Psoriasis w arthritis  Goiter Need updated lab  Get more data on BP control  See below at this t ime continue  Losartan  And go from there  Getting weight back down will help  External stresses adding to this  Outside external source  DATA REVIEWED:  Cards  Care every where   Total time on date  of service including record review ordering  Counseling and plan of care:  32 minutes    -Patient advised to return or notify health care team  if  new concerns arise. In interim   Patient Instructions  Bp ok to day in oiffice  Continue lifestyle intervention healthy eating and exercise .  Take blood pressure readings twice a day for  7- 10 .Send in readings       If needed we may add medication howeverlifestyle intervention healthy eating and exercise .  May be enough to do further control  Goal BP  Perfect 120/80  Acceptable 130/85   Plan rov depending on readings and  Labs    [place] K. Randle Shatzer M.D.

## 2019-08-25 LAB — CBC WITH DIFFERENTIAL/PLATELET
Basophils Absolute: 0 10*3/uL (ref 0.0–0.1)
Basophils Relative: 0.5 % (ref 0.0–3.0)
Eosinophils Absolute: 0.2 10*3/uL (ref 0.0–0.7)
Eosinophils Relative: 6.6 % — ABNORMAL HIGH (ref 0.0–5.0)
HCT: 34.5 % — ABNORMAL LOW (ref 36.0–46.0)
Hemoglobin: 11.3 g/dL — ABNORMAL LOW (ref 12.0–15.0)
Lymphocytes Relative: 32.3 % (ref 12.0–46.0)
Lymphs Abs: 1.1 10*3/uL (ref 0.7–4.0)
MCHC: 32.7 g/dL (ref 30.0–36.0)
MCV: 85.6 fl (ref 78.0–100.0)
Monocytes Absolute: 0.5 10*3/uL (ref 0.1–1.0)
Monocytes Relative: 15.4 % — ABNORMAL HIGH (ref 3.0–12.0)
Neutro Abs: 1.6 10*3/uL (ref 1.4–7.7)
Neutrophils Relative %: 45.2 % (ref 43.0–77.0)
Platelets: 229 10*3/uL (ref 150.0–400.0)
RBC: 4.03 Mil/uL (ref 3.87–5.11)
RDW: 14.6 % (ref 11.5–15.5)
WBC: 3.5 10*3/uL — ABNORMAL LOW (ref 4.0–10.5)

## 2019-08-25 LAB — BASIC METABOLIC PANEL
BUN: 12 mg/dL (ref 6–23)
CO2: 28 mEq/L (ref 19–32)
Calcium: 9.1 mg/dL (ref 8.4–10.5)
Chloride: 100 mEq/L (ref 96–112)
Creatinine, Ser: 1.01 mg/dL (ref 0.40–1.20)
GFR: 56.86 mL/min — ABNORMAL LOW (ref >60.00)
Glucose, Bld: 102 mg/dL — ABNORMAL HIGH (ref 70–99)
Potassium: 4.4 mEq/L (ref 3.5–5.1)
Sodium: 135 mEq/L (ref 135–145)

## 2019-08-25 LAB — T4, FREE: Free T4: 0.95 ng/dL (ref 0.60–1.60)

## 2019-08-25 LAB — TSH: TSH: 0.31 u[IU]/mL — ABNORMAL LOW (ref 0.35–4.50)

## 2019-08-26 ENCOUNTER — Other Ambulatory Visit: Payer: Self-pay

## 2019-08-26 DIAGNOSIS — D509 Iron deficiency anemia, unspecified: Secondary | ICD-10-CM

## 2019-08-26 DIAGNOSIS — E0590 Thyrotoxicosis, unspecified without thyrotoxic crisis or storm: Secondary | ICD-10-CM

## 2019-08-26 NOTE — Progress Notes (Signed)
Thyroid a bit  off again  ( like in past when you saw endocrinologist)    kidney function borderline down    will follow and make sure not  significant  .. avoid meds such as advil alevel  that can be toxic to kidneys   Sodium level is now normal .   YOu have mild a nemia that you have had in the past .  SO plan  tsh  free t4 and  BMP   cbcdiff  and ibc ferritin panel. before next visit ( 1-2 mos)_    hydrated  sample do not have to fast

## 2019-09-23 ENCOUNTER — Other Ambulatory Visit: Payer: Self-pay | Admitting: Internal Medicine

## 2019-10-25 ENCOUNTER — Telehealth: Payer: Self-pay | Admitting: Internal Medicine

## 2019-10-25 NOTE — Telephone Encounter (Signed)
Patient is requesting a referral to have mammogram done.

## 2019-10-25 NOTE — Telephone Encounter (Signed)
Left message for pt to return phone call to get more information.

## 2019-10-26 ENCOUNTER — Other Ambulatory Visit: Payer: Self-pay | Admitting: *Deleted

## 2019-10-26 DIAGNOSIS — Z0000 Encounter for general adult medical examination without abnormal findings: Secondary | ICD-10-CM

## 2019-10-26 NOTE — Telephone Encounter (Signed)
Called pt no answer. Left questions on pt Mychart. Will call again tomorrow.

## 2019-10-26 NOTE — Telephone Encounter (Signed)
Spoke with patient. Patient reports she is due for her mammogram and needed a referral. Patient reports she would like to go back to [facility] for mammogram

## 2019-10-26 NOTE — Telephone Encounter (Signed)
Routine mammo usully doesn't need referral   But if patient needs it go ahead and do it

## 2019-10-27 NOTE — Telephone Encounter (Signed)
Called pt 2x no answer still. Left message to return phone call.Closing note for now.

## 2019-11-08 ENCOUNTER — Encounter: Payer: BC Managed Care – PPO | Admitting: Internal Medicine

## 2019-11-30 ENCOUNTER — Encounter: Payer: BC Managed Care – PPO | Admitting: Internal Medicine

## 2019-12-07 ENCOUNTER — Other Ambulatory Visit: Payer: Self-pay

## 2019-12-07 ENCOUNTER — Ambulatory Visit (INDEPENDENT_AMBULATORY_CARE_PROVIDER_SITE_OTHER): Payer: BC Managed Care – PPO | Admitting: Internal Medicine

## 2019-12-07 ENCOUNTER — Encounter: Payer: Self-pay | Admitting: Internal Medicine

## 2019-12-07 VITALS — BP 128/76 | HR 84 | Temp 97.9°F | Ht 66.5 in | Wt 191.8 lb

## 2019-12-07 DIAGNOSIS — Z79899 Other long term (current) drug therapy: Secondary | ICD-10-CM

## 2019-12-07 DIAGNOSIS — L03032 Cellulitis of left toe: Secondary | ICD-10-CM

## 2019-12-07 DIAGNOSIS — E789 Disorder of lipoprotein metabolism, unspecified: Secondary | ICD-10-CM | POA: Diagnosis not present

## 2019-12-07 DIAGNOSIS — L089 Local infection of the skin and subcutaneous tissue, unspecified: Secondary | ICD-10-CM | POA: Diagnosis not present

## 2019-12-07 DIAGNOSIS — S90425A Blister (nonthermal), left lesser toe(s), initial encounter: Secondary | ICD-10-CM

## 2019-12-07 MED ORDER — CEPHALEXIN 500 MG PO CAPS: 500.0000 mg | ORAL_CAPSULE | Freq: Four times a day (QID) | ORAL | 0 refills | Status: DC

## 2019-12-07 NOTE — Patient Instructions (Signed)
Warm soaks as possibe antibiotic topical ok  Add antibiotic  Oral    Elevation  If not improving over the  Next 48 hours.   Culture pending

## 2019-12-07 NOTE — Progress Notes (Signed)
This visit occurred during the SARS-CoV-2 public health emergency.  Safety protocols were in place, including screening questions prior to the visit, additional usage of staff PPE, and extensive cleaning of exam room while observing appropriate contact time as indicated for disinfecting solutions.    Chief Complaint  Patient presents with  . Blister    Left top of long toe, blister, possibly from new sandals    HPI: Melissa PETTAWAY 54 y.o. come in for new problem  Onset 4 days ago when got blister from new [place] shoes   And since then increasing pain and swelling   soaking some   Having to be up and around   Came in today with no shoe  To fit .  ROS: See pertinent positives and negatives per HPI. Has had r hip bothersome prev off and on from poss over use  No spec injury  Works [employer]   Past Medical History:  Diagnosis Date  . ADD (attention deficit disorder)    poss  . Depression   . Mood disorder ([facility])   . Psoriasis   . Psoriatic arthritis ([facility]) 10/2012    Family History  Problem Relation Age of Onset  . Anxiety disorder Other   . Diabetes Father   . Glaucoma Father   . Myasthenia gravis Mother   . Other Mother        [place]  . Other Brother        devic disease NMO neuromyelitis optica  . Hashimoto's thyroiditis Daughter   . Psoriasis Sister   . Depression Sister   . Arthritis Other        parents     Social History   Socioeconomic History  . Marital status: Married    Spouse name: Not on file  . Number of children: Not on file  . Years of education: Not on file  . Highest education level: Not on file  Occupational History  . Not on file  Tobacco Use  . Smoking status: Never Smoker  . Smokeless tobacco: Never Used  Substance and Sexual Activity  . Alcohol use: Yes    Comment: Socially   . Drug use: No  . Sexual activity: Yes  Other Topics Concern  . Not on file  Social History Narrative    Married   No caffeine   Managing HH   Of 4    Pet dog    Regular exercise- yes  Not much in winter    BA degree      Social Determinants of Health   Financial Resource Strain:   . Difficulty of Paying Living Expenses:   Food Insecurity:   . Worried About Programme researcher, broadcasting/film/video in the Last Year:   . Barista in the Last Year:   Transportation Needs:   . Freight forwarder (Medical):   [name] Lack of Transportation (Non-Medical):   Physical Activity:   . Days of Exercise per Week:   . Minutes of Exercise per Session:   Stress:   . Feeling of Stress :   Social Connections:   . Frequency of Communication with Friends and Family:   . Frequency of Social Gatherings with Friends and Family:   . Attends Religious Services:   . Active Member of Clubs or Organizations:   . Attends Banker Meetings:   [name] Marital Status:     Outpatient Medications Prior to Visit  Medication Sig Dispense Refill  . aspirin EC 81  MG tablet Take 81 mg by mouth daily.    . carbamazepine (CARBATROL) 200 MG 12 hr capsule TAKE 1 CAPSULE EVERY MORNING AND TAKE 2 CAPSULES AT BEDTIME    . lamoTRIgine (LAMICTAL) 100 MG tablet Take 200 mg by mouth daily.    [name] lamoTRIgine (LAMICTAL) 200 MG tablet Take 2 tablets by mouth daily.  4  . losartan (COZAAR) 100 MG tablet TAKE 1 TABLET BY MOUTH EVERY DAY 30 tablet 0  . omeprazole (PRILOSEC) 40 MG capsule     . sucralfate (CARAFATE) 1 g tablet   0  . traZODone (DESYREL) 50 MG tablet Take 50-150 mg by mouth at bedtime as needed for sleep.   3  . zolpidem (AMBIEN) 5 MG tablet Take 1 tablet (5 mg total) by mouth at bedtime as needed for sleep. 15 tablet 0  . dexlansoprazole (DEXILANT) 60 MG capsule Take 60 mg by mouth daily.    . carbamazepine (TEGRETOL XR) 200 MG 12 hr tablet Take 3 tablets by mouth daily.  3   No facility-administered medications prior to visit.     EXAM:  BP 128/76   Pulse 84   Temp 97.9 F (36.6 C) (Temporal)   Ht 5' 6.5" (1.689 m)   Wt 191 lb 12.8 oz (87 kg)   SpO2 98%   BMI 30.49  kg/m   Body mass index is 30.49 kg/m.  GENERAL: vitals reviewed and listed above, alert, oriented, appears well hydrated and in no acute distress HEENT: atraumatic, conjunctiva  clear, no obvious abnormalities on inspection of external nose and ears OP : masked  NECK: no obvious masses on inspection palpation  LUNGS: clear to auscultation bilaterally, no wheezes, rales or rhonchi, good air movement  MS: moves all extremities left middle toe with 1 cm blister cloudy  yesllow  and surrounding redness and to edema faint redness in foot  No clellultis line and no abscess   Unroofed with 20 g needle and couldy yellow white dc sejt for culture  Lab Results  Component Value Date   WBC 3.5 (L) 08/24/2019   HGB 11.3 (L) 08/24/2019   HCT 34.5 (L) 08/24/2019   PLT 229.0 08/24/2019   GLUCOSE 102 (H) 08/24/2019   CHOL 239 (H) 01/24/2019   TRIG 60.0 01/24/2019   HDL 143.30 01/24/2019   LDLDIRECT 51.0 09/16/2012   LDLCALC 83 01/24/2019   ALT 12 01/24/2019   AST 15 01/24/2019   NA 135 08/24/2019   K 4.4 08/24/2019   CL 100 08/24/2019   CREATININE 1.01 08/24/2019   BUN 12 08/24/2019   CO2 28 08/24/2019   TSH 0.31 (L) 08/24/2019   HGBA1C 5.3 01/24/2019   BP Readings from Last 3 Encounters:  12/07/19 128/76  08/24/19 128/70  01/24/19 132/84    ASSESSMENT AND PLAN:  Discussed the following assessment and plan:  Blister of toe of left foot with infection, initial encounter - Plan: WOUND CULTURE  Cellulitis of toe of left foot  Elevated serum cholesterol high HDL - Plan: Lipid panel, Hepatic function panel  Medication management - Plan: Lipid panel, Hepatic function panel Will make lab appt has upcoming cpx  In may  Add lipid a lft Infection with sort tissue poss early cellulitis  No alarm sx  Dressed ns  elevated soak warm not hot keflex 500 qid for 5 days contact is worse or not improved in 48 hours or as needed  See dr Katrinka Blazing about hip pain if needed   -Patient advised to  return or notify health care team  if  new concerns arise.  Patient Instructions  Warm soaks as possibe antibiotic topical ok  Add antibiotic  Oral    Elevation  If not improving over the  Next 48 hours.   Culture pending     Standley Brooking. Ellison Rieth M.D.

## 2019-12-10 LAB — WOUND CULTURE
MICRO NUMBER:: 10416059
SPECIMEN QUALITY:: ADEQUATE

## 2019-12-12 NOTE — Progress Notes (Signed)
Culture showed staph bacteria  sensitive to  antibiotic given . Hopefully getting better with this medication

## 2019-12-14 ENCOUNTER — Telehealth: Payer: Self-pay | Admitting: Internal Medicine

## 2019-12-14 ENCOUNTER — Other Ambulatory Visit: Payer: BC Managed Care – PPO

## 2019-12-14 MED ORDER — CLINDAMYCIN HCL 300 MG PO CAPS: 300.0000 mg | ORAL_CAPSULE | Freq: Three times a day (TID) | ORAL | 0 refills | Status: DC

## 2019-12-14 NOTE — Telephone Encounter (Signed)
See  Med send in clindamycine for 1 week but seems to be localizing now and less rednss

## 2019-12-14 NOTE — Telephone Encounter (Signed)
Pt called requesting to speak with Dr.Panosh advised I could put a message in for her. She has a staph infection that she was being treated for but it is getting worse instead of better. She is catching a flight in a few hours.

## 2019-12-14 NOTE — Telephone Encounter (Signed)
Looks a bit better more localized but can send in clindamycin for 7 day  I [place] find the pharmacy [place]  Please send to the pharamcy she identified

## 2019-12-14 NOTE — Telephone Encounter (Signed)
Called patient and she is leaving for the airport now and she did send a pharmacy in the area that she will be in and she said her toe is looking and feeling worse. Please see MyChart message with picture attached.  Patient may not have access to her MyChart and asked for me to call her back and leave a voice message.

## 2019-12-21 ENCOUNTER — Other Ambulatory Visit (INDEPENDENT_AMBULATORY_CARE_PROVIDER_SITE_OTHER): Payer: BC Managed Care – PPO

## 2019-12-21 ENCOUNTER — Other Ambulatory Visit: Payer: Self-pay

## 2019-12-21 DIAGNOSIS — D509 Iron deficiency anemia, unspecified: Secondary | ICD-10-CM

## 2019-12-21 DIAGNOSIS — F3181 Bipolar II disorder: Secondary | ICD-10-CM | POA: Diagnosis not present

## 2019-12-21 DIAGNOSIS — Z79899 Other long term (current) drug therapy: Secondary | ICD-10-CM

## 2019-12-21 DIAGNOSIS — E789 Disorder of lipoprotein metabolism, unspecified: Secondary | ICD-10-CM | POA: Diagnosis not present

## 2019-12-21 DIAGNOSIS — E871 Hypo-osmolality and hyponatremia: Secondary | ICD-10-CM

## 2019-12-21 DIAGNOSIS — E0590 Thyrotoxicosis, unspecified without thyrotoxic crisis or storm: Secondary | ICD-10-CM

## 2019-12-21 LAB — CBC WITH DIFFERENTIAL/PLATELET
Basophils Absolute: 0.1 10*3/uL (ref 0.0–0.1)
Basophils Relative: 2.2 % (ref 0.0–3.0)
Eosinophils Absolute: 0.3 10*3/uL (ref 0.0–0.7)
Eosinophils Relative: 6.7 % — ABNORMAL HIGH (ref 0.0–5.0)
HCT: 31.6 % — ABNORMAL LOW (ref 36.0–46.0)
Hemoglobin: 10.5 g/dL — ABNORMAL LOW (ref 12.0–15.0)
Lymphocytes Relative: 30.3 % (ref 12.0–46.0)
Lymphs Abs: 1.2 10*3/uL (ref 0.7–4.0)
MCHC: 33.2 g/dL (ref 30.0–36.0)
MCV: 83.9 fl (ref 78.0–100.0)
Monocytes Absolute: 0.5 10*3/uL (ref 0.1–1.0)
Monocytes Relative: 11.4 % (ref 3.0–12.0)
Neutro Abs: 2 10*3/uL (ref 1.4–7.7)
Neutrophils Relative %: 49.4 % (ref 43.0–77.0)
Platelets: 262 10*3/uL (ref 150.0–400.0)
RBC: 3.77 Mil/uL — ABNORMAL LOW (ref 3.87–5.11)
RDW: 15.4 % (ref 11.5–15.5)
WBC: 4 10*3/uL (ref 4.0–10.5)

## 2019-12-21 LAB — IBC PANEL
Iron: 41 ug/dL — ABNORMAL LOW (ref 42–145)
Saturation Ratios: 8.6 % — ABNORMAL LOW (ref 20.0–50.0)
Transferrin: 339 mg/dL (ref 212.0–360.0)

## 2019-12-21 LAB — LIPID PANEL
Cholesterol: 246 mg/dL — ABNORMAL HIGH (ref 0–200)
HDL: 133.8 mg/dL (ref >39.00)
LDL Cholesterol: 102 mg/dL — ABNORMAL HIGH (ref 0–99)
NonHDL: 111.99
Total CHOL/HDL Ratio: 2
Triglycerides: 48 mg/dL (ref 0.0–149.0)
VLDL: 9.6 mg/dL (ref 0.0–40.0)

## 2019-12-21 LAB — T4, FREE: Free T4: 0.85 ng/dL (ref 0.60–1.60)

## 2019-12-21 LAB — BASIC METABOLIC PANEL
BUN: 10 mg/dL (ref 6–23)
CO2: 27 mEq/L (ref 19–32)
Calcium: 8.8 mg/dL (ref 8.4–10.5)
Chloride: 96 mEq/L (ref 96–112)
Creatinine, Ser: 0.61 mg/dL (ref 0.40–1.20)
GFR: 101.63 mL/min (ref >60.00)
Glucose, Bld: 92 mg/dL (ref 70–99)
Potassium: 4.4 mEq/L (ref 3.5–5.1)
Sodium: 130 mEq/L — ABNORMAL LOW (ref 135–145)

## 2019-12-21 LAB — HEPATIC FUNCTION PANEL
ALT: 12 U/L (ref 0–35)
AST: 14 U/L (ref 0–37)
Albumin: 4.1 g/dL (ref 3.5–5.2)
Alkaline Phosphatase: 84 U/L (ref 39–117)
Bilirubin, Direct: 0.1 mg/dL (ref 0.0–0.3)
Total Bilirubin: 0.3 mg/dL (ref 0.2–1.2)
Total Protein: 6.2 g/dL (ref 6.0–8.3)

## 2019-12-21 LAB — FERRITIN: Ferritin: 7.2 ng/mL — ABNORMAL LOW (ref 10.0–291.0)

## 2019-12-21 LAB — TSH: TSH: 0.59 u[IU]/mL (ref 0.35–4.50)

## 2019-12-21 NOTE — Progress Notes (Signed)
Iron level is low   with anemia  still     thyroid ok   Discuss at    visit end of may  for needed follow up

## 2019-12-29 ENCOUNTER — Ambulatory Visit (INDEPENDENT_AMBULATORY_CARE_PROVIDER_SITE_OTHER): Payer: BC Managed Care – PPO | Admitting: Family Medicine

## 2019-12-29 ENCOUNTER — Ambulatory Visit: Payer: Self-pay

## 2019-12-29 ENCOUNTER — Other Ambulatory Visit: Payer: Self-pay

## 2019-12-29 ENCOUNTER — Encounter: Payer: Self-pay | Admitting: Family Medicine

## 2019-12-29 VITALS — BP 140/88 | HR 71 | Ht 66.5 in | Wt 202.2 lb

## 2019-12-29 DIAGNOSIS — L4050 Arthropathic psoriasis, unspecified: Secondary | ICD-10-CM

## 2019-12-29 DIAGNOSIS — M79674 Pain in right toe(s): Secondary | ICD-10-CM

## 2019-12-29 NOTE — Progress Notes (Signed)
I, Christoper Fabian, LAT, ATC, am serving as scribe for Dr. Clementeen Graham.  Melissa Walters is a 54 y.o. female who presents to [facility] at [facility] today for R great toe pain intermittently.  She has previously been seen by Dr. Berline Chough, Jordan Likes and Ramsey for various ailments.  Today, she note R great toe pain intermittently over the past few months/years.  She has a hx of psoriatic arthritis.  Pt states she had a prior L bunion surgery 6-8 years ago.  Radiating pain: Not really R great toe swelling: No Aggravating factors: walking; stationary biking Treatments tried: IBU; Pennsaid   Pertinent review of systems: No fevers or chills  Relevant historical information: History of psoriatic arthritis not currently managed.  Its been years since she was seen by rheumatologist. History left bunionectomy   Exam:  BP 140/88 (BP Location: Right Arm, Patient Position: Sitting, Cuff Size: Large)   Pulse 71   Ht 5' 6.5" (1.689 m)   Wt 202 lb 3.2 oz (91.7 kg)   SpO2 96%   BMI 32.15 kg/m  General: Well Developed, well nourished, and in no acute distress.   MSK: Right great toe bunion formation.  Not erythematous with mild effusion present. Tender palpation dorsal aspect of first MTP. Decreased motion to dorsiflexion plantarflexion. Intact strength. Pulses capillary refill and sensation are intact distally    Lab and Radiology Results X-ray images right great toe ordered and will be done at a later date.  Diagnostic Limited MSK Ultrasound of: Right first MTP Large joint effusion extending proximally from the first MTP with significant synovitis present. Impression: Synovitis and joint effusion first MTP   Procedure: Real-time Ultrasound Guided Injection of right first MTP Device: Philips Affiniti 50G Images permanently stored and available for review in the ultrasound unit. Verbal informed consent obtained.  Discussed risks and benefits of procedure. Warned about  infection bleeding damage to structures skin hypopigmentation and fat atrophy among others. Patient expresses understanding and agreement Time-out conducted.   Noted no overlying erythema, induration, or other signs of local infection.   Skin prepped in a sterile fashion.   Local anesthesia: Topical Ethyl chloride.   With sterile technique and under real time ultrasound guidance:  40 mg of Depo-Medrol and 0.5 mL of Marcaine total volume 1 mL injected easily.   Completed without difficulty   Pain immediately resolved suggesting accurate placement of the medication.   Advised to call if fevers/chills, erythema, induration, drainage, or persistent bleeding.   Images permanently stored and available for review in the ultrasound unit.  Impression: Technically successful ultrasound guided injection.     Assessment and Plan: 54 y.o. female with right first MTP pain.  Multifactorial.  Patient has evidence of hallux rigidus and bunion on exam.  However the most dominant and surprising finding is synovitis and joint effusion.  Given her history of psoriatic arthritis I am concerned that there may be a component of steroid arthritis causing some of her pain today.  Plan for injection as above, turf toe steel insoles.  Additionally discussed modification of footwear and activity. I do think it is a good idea for her to reestablish care with a rheumatologist.  Referral placed to [facility].  Recheck back with me as needed.   PDMP not reviewed this encounter. Orders Placed This Encounter  Procedures  . [place] LIMITED JOINT SPACE STRUCTURES LOW RIGHT(NO LINKED CHARGES)    Order Specific Question:   Reason for Exam (SYMPTOM  OR  DIAGNOSIS REQUIRED)    Answer:   R great toe pain    Order Specific Question:   Preferred imaging location?    Answer:   [place]  . DG Toe Great Right    Standing Status:   Future    Standing Expiration Date:   02/27/2021     Order Specific Question:   Reason for Exam (SYMPTOM  OR DIAGNOSIS REQUIRED)    Answer:   eval toe pain    Order Specific Question:   Is patient pregnant?    Answer:   No    Order Specific Question:   Preferred imaging location?    Answer:   [facility]    Order Specific Question:   Radiology Contrast Protocol - do NOT remove file path    Answer:   [email or web address]  . Ambulatory referral to Rheumatology    Referral Priority:   Routine    Referral Type:   Consultation    Referral Reason:   Specialty Services Required    Requested Specialty:   Rheumatology    Number of Visits Requested:   1   No orders of the defined types were placed in this encounter.    Discussed warning signs or symptoms. Please see discharge instructions. Patient expresses understanding.   The above documentation has been reviewed and is accurate and complete Lynne Leader, M.D.

## 2019-12-29 NOTE — Patient Instructions (Addendum)
Thank you for coming in today. You had an injection in your R great toe.  Call or go to the ER if you develop a large red swollen joint with extreme pain or oozing puss.   You have been referred to rheumatology.  They will call you to schedule an appt but this can take a while.  Please perform the exercise program that we have prepared for you and gone over in detail on a daily basis.  In addition to the handout you were provided you can access your program through: [email or web address]   Your unique program code is:  [email or web address]   Please get an X-ray of your R foot either today at the [place] office or at our office next week.   Use turf toe insoles.    Get a Steel Turf Toe insole.    Do a Microbiologist for [employer]

## 2020-01-06 ENCOUNTER — Encounter: Payer: BC Managed Care – PPO | Admitting: Internal Medicine

## 2020-01-06 DIAGNOSIS — L570 Actinic keratosis: Secondary | ICD-10-CM | POA: Diagnosis not present

## 2020-01-06 DIAGNOSIS — D485 Neoplasm of uncertain behavior of skin: Secondary | ICD-10-CM | POA: Diagnosis not present

## 2020-01-06 DIAGNOSIS — L309 Dermatitis, unspecified: Secondary | ICD-10-CM | POA: Diagnosis not present

## 2020-01-06 DIAGNOSIS — B079 Viral wart, unspecified: Secondary | ICD-10-CM | POA: Diagnosis not present

## 2020-01-30 NOTE — Progress Notes (Deleted)
No chief complaint on file.   HPI: [name]  54 y.o. comes in today for Preventive Health Care visit   Health Maintenance  Topic Date Due  . Hepatitis C Screening  Never done  . COVID-19 Vaccine (1) Never done  . HIV Screening  Never done  . MAMMOGRAM  07/11/2018  . PAP SMEAR-Modifier  07/31/2019  . INFLUENZA VACCINE  03/11/2020  . TETANUS/TDAP  09/08/2021  . COLONOSCOPY  06/16/2028   Health Maintenance Review LIFESTYLE:  Exercise:   Tobacco/ETS: Alcohol:  Sugar beverages: Sleep: Drug use: no HH of  Work:    ROS:  GEN/ HEENT: No fever, significant weight changes sweats headaches vision problems hearing changes, CV/ PULM; No chest pain shortness of breath cough, syncope,edema  change in exercise tolerance. GI /GU: No adominal pain, vomiting, change in bowel habits. No blood in the stool. No significant GU symptoms. SKIN/HEME: ,no acute skin rashes suspicious lesions or bleeding. No lymphadenopathy, nodules, masses.  NEURO/ PSYCH:  No neurologic signs such as weakness numbness. No depression anxiety. IMM/ Allergy: No unusual infections.  Allergy .   REST of 12 system review negative except as per HPI   Past Medical History:  Diagnosis Date  . ADD (attention deficit disorder)    poss  . Depression   . Mood disorder (HCC)   . Psoriasis   . Psoriatic arthritis (HCC) 10/2012    Past Surgical History:  Procedure Laterality Date  . BUNIONECTOMY     bone spur left foot  . ENDOMETRIAL ABLATION    . UPPER GI ENDOSCOPY  06/16/2018   see report    Family History  Problem Relation Age of Onset  . Anxiety disorder Other   . Diabetes Father   . Glaucoma Father   . Myasthenia gravis Mother   . Other Mother        [place]  . Other Brother        devic disease NMO neuromyelitis optica  . Hashimoto's thyroiditis Daughter   . Psoriasis Sister   . Depression Sister   . Arthritis Other        parents     Social History   Socioeconomic History  .  Marital status: Married    Spouse name: Not on file  . Number of children: Not on file  . Years of education: Not on file  . Highest education level: Not on file  Occupational History  . Not on file  Tobacco Use  . Smoking status: Never Smoker  . Smokeless tobacco: Never Used  Vaping Use  . Vaping Use: Never used  Substance and Sexual Activity  . Alcohol use: Yes    Comment: Socially   . Drug use: No  . Sexual activity: Yes  Other Topics Concern  . Not on file  Social History Narrative    Married   No caffeine   Managing HH   Of 4    Pet dog   Regular exercise- yes  Not much in winter    BA degree      Social Determinants of Health   Financial Resource Strain:   . Difficulty of Paying Living Expenses:   Food Insecurity:   . Worried About Programme researcher, broadcasting/film/video in the Last Year:   . Barista in the Last Year:   Transportation Needs:   . Freight forwarder (Medical):   [name] Lack of Transportation (Non-Medical):   Physical Activity:   . Days  of Exercise per Week:   . Minutes of Exercise per Session:   Stress:   . Feeling of Stress :   Social Connections:   . Frequency of Communication with Friends and Family:   . Frequency of Social Gatherings with Friends and Family:   . Attends Religious Services:   . Active Member of Clubs or Organizations:   . Attends Banker Meetings:   [name] Marital Status:     Outpatient Medications Prior to Visit  Medication Sig Dispense Refill  . aspirin EC 81 MG tablet Take 81 mg by mouth daily.    . carbamazepine (CARBATROL) 200 MG 12 hr capsule TAKE 1 CAPSULE EVERY MORNING AND TAKE 2 CAPSULES AT BEDTIME    . cephALEXin (KEFLEX) 500 MG capsule Take 1 capsule (500 mg total) by mouth 4 (four) times daily. 20 capsule 0  . clindamycin (CLEOCIN) 300 MG capsule Take 1 capsule (300 mg total) by mouth 3 (three) times daily. 21 capsule 0  . dexlansoprazole (DEXILANT) 60 MG capsule Take 60 mg by mouth daily.    [name] lamoTRIgine  (LAMICTAL) 100 MG tablet Take 200 mg by mouth daily.    [name] lamoTRIgine (LAMICTAL) 200 MG tablet Take 2 tablets by mouth daily.  4  . losartan (COZAAR) 100 MG tablet TAKE 1 TABLET BY MOUTH EVERY DAY 30 tablet 0  . omeprazole (PRILOSEC) 40 MG capsule     . sucralfate (CARAFATE) 1 g tablet   0  . traZODone (DESYREL) 50 MG tablet Take 50-150 mg by mouth at bedtime as needed for sleep.   3  . zolpidem (AMBIEN) 5 MG tablet Take 1 tablet (5 mg total) by mouth at bedtime as needed for sleep. 15 tablet 0   No facility-administered medications prior to visit.     EXAM:  There were no vitals taken for this visit.  There is no height or weight on file to calculate BMI. Wt Readings from Last 3 Encounters:  12/29/19 202 lb 3.2 oz (91.7 kg)  12/07/19 191 lb 12.8 oz (87 kg)  08/24/19 199 lb 3.2 oz (90.4 kg)    Physical Exam: Vital signs reviewed YQM:VHQI is a well-developed well-nourished alert cooperative    who appearsr stated age in no acute distress.  HEENT: normocephalic atraumatic , Eyes: PERRL EOM's full, conjunctiva clear, Nares: paten,t no deformity discharge or tenderness., Ears: no deformity EAC's clear TMs with normal landmarks. Mouth: clear OP, no lesions, edema.  Moist mucous membranes. Dentition in adequate repair. NECK: supple without masses, thyromegaly or bruits. CHEST/PULM:  Clear to auscultation and percussion breath sounds equal no wheeze , rales or rhonchi. No chest wall deformities or tenderness. Breast: normal by inspection . No dimpling, discharge, masses, tenderness or discharge . CV: PMI is nondisplaced, S1 S2 no gallops, murmurs, rubs. Peripheral pulses are full without delay.No JVD .  ABDOMEN: Bowel sounds normal nontender  No guard or rebound, no hepato splenomegal no CVA tenderness.  No hernia. Extremtities:  No clubbing cyanosis or edema, no acute joint swelling or redness no focal atrophy NEURO:  Oriented x3, cranial nerves 3-12 appear to be intact, no obvious focal  weakness,gait within normal limits no abnormal reflexes or asymmetrical SKIN: No acute rashes normal turgor, color, no bruising or petechiae. PSYCH: Oriented, good eye contact, no obvious depression anxiety, cognition and judgment appear normal. LN: no cervical axillary inguinal adenopathy  Lab Results  Component Value Date   WBC 4.0 12/21/2019   HGB 10.5 (L) 12/21/2019  HCT 31.6 (L) 12/21/2019   PLT 262.0 12/21/2019   GLUCOSE 92 12/21/2019   CHOL 246 (H) 12/21/2019   TRIG 48.0 12/21/2019   HDL 133.80 12/21/2019   LDLDIRECT 51.0 09/16/2012   LDLCALC 102 (H) 12/21/2019   ALT 12 12/21/2019   AST 14 12/21/2019   NA 130 (L) 12/21/2019   K 4.4 12/21/2019   CL 96 12/21/2019   CREATININE 0.61 12/21/2019   BUN 10 12/21/2019   CO2 27 12/21/2019   TSH 0.59 12/21/2019   HGBA1C 5.3 01/24/2019    BP Readings from Last 3 Encounters:  12/29/19 140/88  12/07/19 128/76  08/24/19 128/70    Lab results reviewed with patient   ASSESSMENT AND PLAN:  Discussed the following assessment and plan:    ICD-10-CM   1. Visit for preventive health examination  Z00.00   2. Medication management  Z79.899    No follow-ups on file.  Patient Care Team: Adalei Novell, Standley Brooking, MD as PCP - Cyndia Diver, MD as PCP - Cardiology (Cardiology) Eliseo Gum, MD (Psychiatry) Olga Millers, MD as Attending Physician (Obstetrics and Gynecology) Juanita Craver, MD as Consulting Physician (Gastroenterology) There are no Patient Instructions on file for this visit.  Standley Brooking. Fayette Hamada M.D.

## 2020-01-31 ENCOUNTER — Encounter: Payer: BC Managed Care – PPO | Admitting: Internal Medicine

## 2020-01-31 DIAGNOSIS — L723 Sebaceous cyst: Secondary | ICD-10-CM | POA: Diagnosis not present

## 2020-01-31 DIAGNOSIS — L408 Other psoriasis: Secondary | ICD-10-CM | POA: Diagnosis not present

## 2020-01-31 DIAGNOSIS — L570 Actinic keratosis: Secondary | ICD-10-CM | POA: Diagnosis not present

## 2020-01-31 DIAGNOSIS — L219 Seborrheic dermatitis, unspecified: Secondary | ICD-10-CM | POA: Diagnosis not present

## 2020-02-10 ENCOUNTER — Other Ambulatory Visit: Payer: Self-pay | Admitting: Internal Medicine

## 2020-02-21 NOTE — Progress Notes (Signed)
Chief Complaint  Patient presents with  . Annual Exam    Doing well    HPI: [name]  54 y.o. comes in today for Preventive Health Care visit  No new concerns  Toe is better  Was supposed to have rheum referral from  [facility] for   Psoriatic arthritis   thyroid dno change  Taking  prilosec   And ocass [place] for gi dyspepsia  Prev dx with UD per dr Collene Mares a few years ago   No bleeding ( had ablation gyne) and no gi bleed noted   Takes asa qd because with evaluation for palpitations although not AF advised to take ASA   Needs refill losartan  bp has been ok.  Hx of varicella more than once ? About the shingles vaccine  Health Maintenance  Topic Date Due  . Hepatitis C Screening  Never done  . HIV Screening  Never done  . MAMMOGRAM  07/11/2018  . PAP SMEAR-Modifier  07/31/2019  . INFLUENZA VACCINE  03/11/2020  . TETANUS/TDAP  09/08/2021  . COLONOSCOPY  06/16/2028  . COVID-19 Vaccine  Completed   Health Maintenance Review LIFESTYLE:  Exercise:  Yes pe[place]  3 x per week  Or so  Bikes  Tobacco/ETS: Alcohol: n Sugar beverages:n Sleep:y Drug use: no HH of 3 Work: sporadic     ROS:  GEN/ HEENT: No fever, significant weight changes sweats headaches vision problems hearing changes, CV/ PULM; No chest pain shortness of breath cough, syncope,edema  change in exercise tolerance. GI /GU: No adominal pain, vomiting, change in bowel habits. No blood in the stool. No significant GU symptoms. SKIN/HEME: ,no acute skin rashes suspicious lesions or bleeding. No lymphadenopathy, nodules, masses.  NEURO/ PSYCH:  No neurologic signs such as weakness numbness. No depression anxiety. IMM/ Allergy: No unusual infections.  Allergy .   REST of 12 system review negative except as per HPI   Past Medical History:  Diagnosis Date  . ADD (attention deficit disorder)    poss  . Depression   . Mood disorder ([place])   . Psoriasis   . Psoriatic arthritis ([place]) 10/2012     Past Surgical History:  Procedure Laterality Date  . BUNIONECTOMY     bone spur left foot  . ENDOMETRIAL ABLATION    . UPPER GI ENDOSCOPY  06/16/2018   see report    Family History  Problem Relation Age of Onset  . Anxiety disorder Other   . Diabetes Father   . Glaucoma Father   . Myasthenia gravis Mother   . Other Mother        [place]  . Other Brother        devic disease NMO neuromyelitis optica  . Hashimoto's thyroiditis Daughter   . Psoriasis Sister   . Depression Sister   . Arthritis Other        parents     Social History   Socioeconomic History  . Marital status: Married    Spouse name: Not on file  . Number of children: Not on file  . Years of education: Not on file  . Highest education level: Not on file  Occupational History  . Not on file  Tobacco Use  . Smoking status: Never Smoker  . Smokeless tobacco: Never Used  Vaping Use  . Vaping Use: Never used  Substance and Sexual Activity  . Alcohol use: Yes    Comment: Socially   . Drug use: No  . Sexual  activity: Yes  Other Topics Concern  . Not on file  Social History Narrative    Married   No caffeine   Managing HH   Of 4    Pet dog   Regular exercise- yes  Not much in winter    BA degree      Social Determinants of Health   Financial Resource Strain:   . Difficulty of Paying Living Expenses:   Food Insecurity:   . Worried About Charity fundraiser in the Last Year:   . Arboriculturist in the Last Year:   Transportation Needs:   . Film/video editor (Medical):   [name] Lack of Transportation (Non-Medical):   Physical Activity:   . Days of Exercise per Week:   . Minutes of Exercise per Session:   Stress:   . Feeling of Stress :   Social Connections:   . Frequency of Communication with Friends and Family:   . Frequency of Social Gatherings with Friends and Family:   . Attends Religious Services:   . Active Member of Clubs or Organizations:   . Attends Archivist Meetings:    [name] Marital Status:     Outpatient Medications Prior to Visit  Medication Sig Dispense Refill  . aspirin EC 81 MG tablet Take 81 mg by mouth daily.    . carbamazepine (CARBATROL) 200 MG 12 hr capsule TAKE 1 CAPSULE EVERY MORNING AND TAKE 2 CAPSULES AT BEDTIME    . fluorouracil (EFUDEX) 5 % cream Apply topically.    . lamoTRIgine (LAMICTAL) 100 MG tablet Take 200 mg by mouth daily.    [name] lamoTRIgine (LAMICTAL) 200 MG tablet Take 2 tablets by mouth daily.  4  . omeprazole (PRILOSEC) 40 MG capsule     . sucralfate (CARAFATE) 1 g tablet   0  . tacrolimus (PROTOPIC) 0.1 % ointment SMARTSIG:1 Topical Every Night    . traZODone (DESYREL) 50 MG tablet Take 50-150 mg by mouth at bedtime as needed for sleep.   3  . zolpidem (AMBIEN) 5 MG tablet Take 1 tablet (5 mg total) by mouth at bedtime as needed for sleep. 15 tablet 0  . losartan (COZAAR) 100 MG tablet TAKE 1 TABLET BY MOUTH EVERY DAY 30 tablet 0  . cephALEXin (KEFLEX) 500 MG capsule Take 1 capsule (500 mg total) by mouth 4 (four) times daily. 20 capsule 0  . clindamycin (CLEOCIN) 300 MG capsule Take 1 capsule (300 mg total) by mouth 3 (three) times daily. 21 capsule 0  . dexlansoprazole (DEXILANT) 60 MG capsule Take 60 mg by mouth daily.     No facility-administered medications prior to visit.     EXAM:  BP 126/82   Pulse 78   Temp 98.6 F (37 C) (Oral)   Ht 5' 6.75" (1.695 m)   Wt 191 lb 9.6 oz (86.9 kg)   SpO2 96%   BMI 30.23 kg/m   Body mass index is 30.23 kg/m. Wt Readings from Last 3 Encounters:  02/22/20 191 lb 9.6 oz (86.9 kg)  12/29/19 202 lb 3.2 oz (91.7 kg)  12/07/19 191 lb 12.8 oz (87 kg)    Physical Exam: Vital signs reviewed VQQ:VZDG is a well-developed well-nourished alert cooperative    who appearsr stated age in no acute distress.  HEENT: normocephalic atraumatic , Eyes: PERRL EOM's full, conjunctiva clear, Nares: paten,t no deformity discharge or tenderness., Ears: no deformity EAC's clear TMs with normal  landmarks. Mouth: clear OP,masked NECK: supple without  masses, thyromegaly or bruits. CHEST/PULM:  Clear to auscultation and percussion breath sounds equal no wheeze , rales or rhonchi. No chest wall deformities or tenderness. Breast: normal by inspection . No dimpling, discharge, masses, tenderness or discharge . CV: PMI is nondisplaced, S1 S2 no gallops, murmurs, rubs. Peripheral pulses are full without delay.No JVD .  ABDOMEN: Bowel sounds normal nontender  No guard or rebound, no hepato splenomegal no CVA tenderness.   Extremtities:  No clubbing cyanosis or edema, no acute joint swelling or redness no focal atrophy NEURO:  Oriented x3, cranial nerves 3-12 appear to be intact, no obvious focal weakness,gait within normal limits no abnormal reflexes or asymmetrical SKIN: No acute rashes normal turgor, color, no bruising or petechiae. PSYCH: Oriented, good eye contact, no obvious depression anxiety, cognition and judgment appear normal. LN: no cervical axillary inguinal adenopathy  Lab Results  Component Value Date   WBC 4.0 12/21/2019   HGB 10.5 (L) 12/21/2019   HCT 31.6 (L) 12/21/2019   PLT 262.0 12/21/2019   GLUCOSE 92 12/21/2019   CHOL 246 (H) 12/21/2019   TRIG 48.0 12/21/2019   HDL 133.80 12/21/2019   LDLDIRECT 51.0 09/16/2012   LDLCALC 102 (H) 12/21/2019   ALT 12 12/21/2019   AST 14 12/21/2019   NA 130 (L) 12/21/2019   K 4.4 12/21/2019   CL 96 12/21/2019   CREATININE 0.61 12/21/2019   BUN 10 12/21/2019   CO2 27 12/21/2019   TSH 0.59 12/21/2019   HGBA1C 5.3 01/24/2019    BP Readings from Last 3 Encounters:  02/22/20 126/82  12/29/19 140/88  12/07/19 128/76    Lab results reviewed with patient   ASSESSMENT AND PLAN:  Discussed the following assessment and plan:    ICD-10-CM   1. Visit for preventive health examination  Z00.00   2. Medication management  Z79.899   3. Psoriasis  L40.9   4. Subclinical hyperthyroidism  E05.90   5. Iron deficiency anemia,  unspecified iron deficiency anemia type  D50.9 CBC with Differential/Platelet    Iron, TIBC and Ferritin Panel    Vitamin B12  6. Hyperlipidemia, unspecified hyperlipidemia type  E78.5   7. Anemia, unspecified type  D64.9 CBC with Differential/Platelet    Iron, TIBC and Ferritin Panel    Vitamin B12   Return for blood work  at [place] for anemia fu. , shiungrix vaccine when [place]. Rheum referral   Tracking   agree with reevlatuation Anemia fu labs   stop asa  Hx of gastric ulcer?   prob referral back to dr Collene Mares  Waiting on lab results  Gyne pap next year or other .  Refill losartan today  Patient Care Team: Panosh, Standley Brooking, MD as PCP - Cyndia Diver, MD as PCP - Cardiology (Cardiology) Eliseo Gum, MD (Psychiatry) Olga Millers, MD as Attending Physician (Obstetrics and Gynecology) Juanita Craver, MD as Consulting Physician (Gastroenterology) Patient Instructions  Stop the asa for now .   Get lab at [place] lab in next week  Or so .   Plan fu poss dr Collene Mares about the anemia .   Get [place] your form     shingrix   Vaccine when convenient   Can get pap at gyne or here net year or as per dr Harrington Challenger office .  Get your mammogram .   Preventive Care 73-79 Years Old, Female Preventive care refers to visits with your health care provider and lifestyle choices that can promote health and wellness. This  includes:  A yearly physical exam. This may also be called an annual well check.  Regular dental visits and eye exams.  Immunizations.  Screening for certain conditions.  Healthy lifestyle choices, such as eating a healthy diet, getting regular exercise, not using drugs or products that contain nicotine and tobacco, and limiting alcohol use. What can I expect for my preventive care visit? Physical exam Your health care provider will check your:  Height and weight. This may be used to calculate body mass index (BMI), which tells if you are at a healthy  weight.  Heart rate and blood pressure.  Skin for abnormal spots. Counseling Your health care provider may ask you questions about your:  Alcohol, tobacco, and drug use.  Emotional well-being.  Home and relationship well-being.  Sexual activity.  Eating habits.  Work and work Statistician.  Method of birth control.  Menstrual cycle.  Pregnancy history. What immunizations do I need?  Influenza (flu) vaccine  This is recommended every year. Tetanus, diphtheria, and pertussis (Tdap) vaccine  You may need a Td booster every 10 years. Varicella (chickenpox) vaccine  You may need this if you have not been vaccinated. Zoster (shingles) vaccine  You may need this after age 89. Measles, mumps, and rubella (MMR) vaccine  You may need at least one dose of MMR if you were born in 1957 or later. You may also need a second dose. Pneumococcal conjugate (PCV13) vaccine  You may need this if you have certain conditions and were not previously vaccinated. Pneumococcal polysaccharide (PPSV23) vaccine  You may need one or two doses if you smoke cigarettes or if you have certain conditions. Meningococcal conjugate (MenACWY) vaccine  You may need this if you have certain conditions. Hepatitis A vaccine  You may need this if you have certain conditions or if you travel or work in places where you may be exposed to hepatitis A. Hepatitis B vaccine  You may need this if you have certain conditions or if you travel or work in places where you may be exposed to hepatitis B. Haemophilus influenzae type b (Hib) vaccine  You may need this if you have certain conditions. Human papillomavirus (HPV) vaccine  If recommended by your health care provider, you may need three doses over 6 months. You may receive vaccines as individual doses or as more than one vaccine together in one shot (combination vaccines). Talk with your health care provider about the risks and benefits of combination  vaccines. What tests do I need? Blood tests  Lipid and cholesterol levels. These may be checked every 5 years, or more frequently if you are over 39 years old.  Hepatitis C test.  Hepatitis B test. Screening  Lung cancer screening. You may have this screening every year starting at age 31 if you have a 30-pack-year history of smoking and currently smoke or have quit within the past 15 years.  Colorectal cancer screening. All adults should have this screening starting at age 68 and continuing until age 57. Your health care provider may recommend screening at age 47 if you are at increased risk. You will have tests every 1-10 years, depending on your results and the type of screening test.  Diabetes screening. This is done by checking your blood sugar (glucose) after you have not eaten for a while (fasting). You may have this done every 1-3 years.  Mammogram. This may be done every 1-2 years. Talk with your health care provider about when you should start having  regular mammograms. This may depend on whether you have a family history of breast cancer.  BRCA-related cancer screening. This may be done if you have a family history of breast, ovarian, tubal, or peritoneal cancers.  Pelvic exam and Pap test. This may be done every 3 years starting at age 11. Starting at age 1, this may be done every 5 years if you have a Pap test in combination with an HPV test. Other tests  Sexually transmitted disease (STD) testing.  Bone density scan. This is done to screen for osteoporosis. You may have this scan if you are at high risk for osteoporosis. Follow these instructions at home: Eating and drinking  Eat a diet that includes fresh fruits and vegetables, whole grains, lean protein, and low-fat dairy.  Take vitamin and mineral supplements as recommended by your health care provider.  Do not drink alcohol if: ? Your health care provider tells you not to drink. ? You are pregnant, may be  pregnant, or are planning to become pregnant.  If you drink alcohol: ? Limit how much you have to 0-1 drink a day. ? Be aware of how much alcohol is in your drink. In the [place] one drink equals one 12 oz bottle of beer (355 mL), one 5 oz glass of wine (148 mL), or one 1 oz glass of hard liquor (44 mL). Lifestyle  Take daily care of your teeth and gums.  Stay active. Exercise for at least 30 minutes on 5 or more days each week.  Do not use any products that contain nicotine or tobacco, such as cigarettes, e-cigarettes, and chewing tobacco. If you need help quitting, ask your health care provider.  If you are sexually active, practice safe sex. Use a condom or other form of birth control (contraception) in order to prevent pregnancy and STIs (sexually transmitted infections).  If told by your health care provider, take low-dose aspirin daily starting at age 59. What's next?  Visit your health care provider once a year for a well check visit.  Ask your health care provider how often you should have your eyes and teeth checked.  Stay up to date on all vaccines. This information is not intended to replace advice given to you by your health care provider. Make sure you discuss any questions you have with your health care provider. Document Revised: 04/08/2018 Document Reviewed: 04/08/2018 Elsevier Patient Education  2020 [place] Rori Goar M.D.

## 2020-02-22 ENCOUNTER — Other Ambulatory Visit: Payer: Self-pay

## 2020-02-22 ENCOUNTER — Encounter: Payer: Self-pay | Admitting: Internal Medicine

## 2020-02-22 ENCOUNTER — Ambulatory Visit (INDEPENDENT_AMBULATORY_CARE_PROVIDER_SITE_OTHER): Payer: BC Managed Care – PPO | Admitting: Internal Medicine

## 2020-02-22 VITALS — BP 126/82 | HR 78 | Temp 98.6°F | Ht 66.75 in | Wt 191.6 lb

## 2020-02-22 DIAGNOSIS — Z79899 Other long term (current) drug therapy: Secondary | ICD-10-CM | POA: Diagnosis not present

## 2020-02-22 DIAGNOSIS — L409 Psoriasis, unspecified: Secondary | ICD-10-CM | POA: Diagnosis not present

## 2020-02-22 DIAGNOSIS — D509 Iron deficiency anemia, unspecified: Secondary | ICD-10-CM

## 2020-02-22 DIAGNOSIS — Z0000 Encounter for general adult medical examination without abnormal findings: Secondary | ICD-10-CM

## 2020-02-22 DIAGNOSIS — E785 Hyperlipidemia, unspecified: Secondary | ICD-10-CM

## 2020-02-22 DIAGNOSIS — E0590 Thyrotoxicosis, unspecified without thyrotoxic crisis or storm: Secondary | ICD-10-CM | POA: Diagnosis not present

## 2020-02-22 DIAGNOSIS — D649 Anemia, unspecified: Secondary | ICD-10-CM

## 2020-02-22 MED ORDER — LOSARTAN POTASSIUM 100 MG PO TABS: ORAL_TABLET | ORAL | 2 refills | Status: DC

## 2020-02-22 NOTE — Patient Instructions (Addendum)
Stop the asa for now .   Get lab at [place] lab in next week  Or so .   Plan fu poss dr Collene Mares about the anemia .   Get [place] your form     shingrix   Vaccine when convenient   Can get pap at gyne or here net year or as per dr Harrington Challenger office .  Get your mammogram .   Preventive Care 13-56 Years Old, Female Preventive care refers to visits with your health care provider and lifestyle choices that can promote health and wellness. This includes:  A yearly physical exam. This may also be called an annual well check.  Regular dental visits and eye exams.  Immunizations.  Screening for certain conditions.  Healthy lifestyle choices, such as eating a healthy diet, getting regular exercise, not using drugs or products that contain nicotine and tobacco, and limiting alcohol use. What can I expect for my preventive care visit? Physical exam Your health care provider will check your:  Height and weight. This may be used to calculate body mass index (BMI), which tells if you are at a healthy weight.  Heart rate and blood pressure.  Skin for abnormal spots. Counseling Your health care provider may ask you questions about your:  Alcohol, tobacco, and drug use.  Emotional well-being.  Home and relationship well-being.  Sexual activity.  Eating habits.  Work and work Statistician.  Method of birth control.  Menstrual cycle.  Pregnancy history. What immunizations do I need?  Influenza (flu) vaccine  This is recommended every year. Tetanus, diphtheria, and pertussis (Tdap) vaccine  You may need a Td booster every 10 years. Varicella (chickenpox) vaccine  You may need this if you have not been vaccinated. Zoster (shingles) vaccine  You may need this after age 44. Measles, mumps, and rubella (MMR) vaccine  You may need at least one dose of MMR if you were born in 1957 or later. You may also need a second dose. Pneumococcal conjugate (PCV13) vaccine  You may need this if you  have certain conditions and were not previously vaccinated. Pneumococcal polysaccharide (PPSV23) vaccine  You may need one or two doses if you smoke cigarettes or if you have certain conditions. Meningococcal conjugate (MenACWY) vaccine  You may need this if you have certain conditions. Hepatitis A vaccine  You may need this if you have certain conditions or if you travel or work in places where you may be exposed to hepatitis A. Hepatitis B vaccine  You may need this if you have certain conditions or if you travel or work in places where you may be exposed to hepatitis B. Haemophilus influenzae type b (Hib) vaccine  You may need this if you have certain conditions. Human papillomavirus (HPV) vaccine  If recommended by your health care provider, you may need three doses over 6 months. You may receive vaccines as individual doses or as more than one vaccine together in one shot (combination vaccines). Talk with your health care provider about the risks and benefits of combination vaccines. What tests do I need? Blood tests  Lipid and cholesterol levels. These may be checked every 5 years, or more frequently if you are over 42 years old.  Hepatitis C test.  Hepatitis B test. Screening  Lung cancer screening. You may have this screening every year starting at age 53 if you have a 30-pack-year history of smoking and currently smoke or have quit within the past 15 years.  Colorectal cancer screening. All  adults should have this screening starting at age 78 and continuing until age 52. Your health care provider may recommend screening at age 53 if you are at increased risk. You will have tests every 1-10 years, depending on your results and the type of screening test.  Diabetes screening. This is done by checking your blood sugar (glucose) after you have not eaten for a while (fasting). You may have this done every 1-3 years.  Mammogram. This may be done every 1-2 years. Talk with your  health care provider about when you should start having regular mammograms. This may depend on whether you have a family history of breast cancer.  BRCA-related cancer screening. This may be done if you have a family history of breast, ovarian, tubal, or peritoneal cancers.  Pelvic exam and Pap test. This may be done every 3 years starting at age 69. Starting at age 13, this may be done every 5 years if you have a Pap test in combination with an HPV test. Other tests  Sexually transmitted disease (STD) testing.  Bone density scan. This is done to screen for osteoporosis. You may have this scan if you are at high risk for osteoporosis. Follow these instructions at home: Eating and drinking  Eat a diet that includes fresh fruits and vegetables, whole grains, lean protein, and low-fat dairy.  Take vitamin and mineral supplements as recommended by your health care provider.  Do not drink alcohol if: ? Your health care provider tells you not to drink. ? You are pregnant, may be pregnant, or are planning to become pregnant.  If you drink alcohol: ? Limit how much you have to 0-1 drink a day. ? Be aware of how much alcohol is in your drink. In the [place] one drink equals one 12 oz bottle of beer (355 mL), one 5 oz glass of wine (148 mL), or one 1 oz glass of hard liquor (44 mL). Lifestyle  Take daily care of your teeth and gums.  Stay active. Exercise for at least 30 minutes on 5 or more days each week.  Do not use any products that contain nicotine or tobacco, such as cigarettes, e-cigarettes, and chewing tobacco. If you need help quitting, ask your health care provider.  If you are sexually active, practice safe sex. Use a condom or other form of birth control (contraception) in order to prevent pregnancy and STIs (sexually transmitted infections).  If told by your health care provider, take low-dose aspirin daily starting at age 59. What's next?  Visit your health care provider once  a year for a well check visit.  Ask your health care provider how often you should have your eyes and teeth checked.  Stay up to date on all vaccines. This information is not intended to replace advice given to you by your health care provider. Make sure you discuss any questions you have with your health care provider. Document Revised: 04/08/2018 Document Reviewed: 04/08/2018 Elsevier Patient Education  2020 [employer].

## 2020-06-20 DIAGNOSIS — F3181 Bipolar II disorder: Secondary | ICD-10-CM | POA: Diagnosis not present

## 2020-08-21 DIAGNOSIS — L821 Other seborrheic keratosis: Secondary | ICD-10-CM | POA: Diagnosis not present

## 2020-08-21 DIAGNOSIS — Z85828 Personal history of other malignant neoplasm of skin: Secondary | ICD-10-CM | POA: Diagnosis not present

## 2020-08-21 DIAGNOSIS — L814 Other melanin hyperpigmentation: Secondary | ICD-10-CM | POA: Diagnosis not present

## 2020-08-21 DIAGNOSIS — B078 Other viral warts: Secondary | ICD-10-CM | POA: Diagnosis not present

## 2020-08-21 DIAGNOSIS — L409 Psoriasis, unspecified: Secondary | ICD-10-CM | POA: Diagnosis not present

## 2020-08-29 DIAGNOSIS — M79672 Pain in left foot: Secondary | ICD-10-CM | POA: Diagnosis not present

## 2020-08-29 DIAGNOSIS — R5382 Chronic fatigue, unspecified: Secondary | ICD-10-CM | POA: Diagnosis not present

## 2020-08-29 DIAGNOSIS — M79642 Pain in left hand: Secondary | ICD-10-CM | POA: Diagnosis not present

## 2020-08-29 DIAGNOSIS — M79641 Pain in right hand: Secondary | ICD-10-CM | POA: Diagnosis not present

## 2020-08-29 DIAGNOSIS — M2550 Pain in unspecified joint: Secondary | ICD-10-CM | POA: Diagnosis not present

## 2020-08-29 DIAGNOSIS — L401 Generalized pustular psoriasis: Secondary | ICD-10-CM | POA: Diagnosis not present

## 2020-08-29 DIAGNOSIS — M79671 Pain in right foot: Secondary | ICD-10-CM | POA: Diagnosis not present

## 2020-09-12 DIAGNOSIS — L401 Generalized pustular psoriasis: Secondary | ICD-10-CM | POA: Diagnosis not present

## 2020-09-12 DIAGNOSIS — M2550 Pain in unspecified joint: Secondary | ICD-10-CM | POA: Diagnosis not present

## 2020-09-12 DIAGNOSIS — L4059 Other psoriatic arthropathy: Secondary | ICD-10-CM | POA: Diagnosis not present

## 2020-09-12 DIAGNOSIS — R5382 Chronic fatigue, unspecified: Secondary | ICD-10-CM | POA: Diagnosis not present

## 2020-09-13 DIAGNOSIS — D485 Neoplasm of uncertain behavior of skin: Secondary | ICD-10-CM | POA: Diagnosis not present

## 2020-09-13 DIAGNOSIS — C44329 Squamous cell carcinoma of skin of other parts of face: Secondary | ICD-10-CM | POA: Diagnosis not present

## 2020-10-16 DIAGNOSIS — L570 Actinic keratosis: Secondary | ICD-10-CM | POA: Diagnosis not present

## 2020-10-16 DIAGNOSIS — D0439 Carcinoma in situ of skin of other parts of face: Secondary | ICD-10-CM | POA: Diagnosis not present

## 2020-10-16 DIAGNOSIS — D485 Neoplasm of uncertain behavior of skin: Secondary | ICD-10-CM | POA: Diagnosis not present

## 2020-10-31 DIAGNOSIS — F3181 Bipolar II disorder: Secondary | ICD-10-CM | POA: Diagnosis not present

## 2020-11-05 DIAGNOSIS — F3181 Bipolar II disorder: Secondary | ICD-10-CM | POA: Diagnosis not present

## 2020-11-05 NOTE — Progress Notes (Signed)
Chief Complaint  Patient presents with  . Annual Exam  . Eye Injury    Patient states she has an eye infection in left eye, noticed this morning    HPI: [name]  55 y.o. comes in today for Preventive Health Care visit  Has form to complete Doing pretty well  Has begun Humira x 1 mos for psoriatic arthritis   ? Name of rheumatologist  Working out a lot this month and feeling well with this. Eyes extended contacts  Left in too long and gets this  Time to time   Usually responds to antibiotic drops and exclusion of contacts  No vision change some photophobia   Asks about getting shingrix   Health Maintenance  Topic Date Due  . Hepatitis C Screening  Never done  . HIV Screening  Never done  . MAMMOGRAM  07/11/2018  . PAP SMEAR-Modifier  07/31/2019  . INFLUENZA VACCINE  12/23/2020 (Originally 03/11/2020)  . TETANUS/TDAP  09/08/2021  . COLONOSCOPY (Pts [id number] Insurance coverage will need to be confirmed)  06/16/2028  . COVID-19 Vaccine  Completed  . HPV VACCINES  Aged Out   Health Maintenance Review LIFESTYLE:  Exercise:    Sedentary to  Weeks of working out  5 day per week.  ffot  challenge .  [place].  Tobacco/ETS: Alcohol: n Sugar beverages:   n Sleep: not much   6- 7 x per night  Nocturia  Trying to push water  But not in day so much  Drug use: no HH of 2  Work:  Pt   But longer  And volunteering   [employer]   ROS: see hpi GEN/ HEENT: No fever, significant weight changes sweats headaches vision problems hearing changes, CV/ PULM; No chest pain shortness of breath cough, syncope,edema  change in exercise tolerance. GI /GU: No adominal pain, vomiting, change in bowel habits. No blood in the stool.  SKIN/HEME: ,no acute skin rashes suspicious lesions or bleeding. No lymphadenopathy, nodules, masses.  NEURO/ PSYCH:  No neurologic signs such as weakness numbness. No depression anxiety. IMM/ Allergy: No unusual infections.  Allergy .   REST of 12 system review  negative except as per HPI   Past Medical History:  Diagnosis Date  . ADD (attention deficit disorder)    poss  . Depression   . Mood disorder (HCC)   . Psoriasis   . Psoriatic arthritis (HCC) 10/2012    Past Surgical History:  Procedure Laterality Date  . BUNIONECTOMY     bone spur left foot  . ENDOMETRIAL ABLATION    . UPPER GI ENDOSCOPY  06/16/2018   see report    Family History  Problem Relation Age of Onset  . Anxiety disorder Other   . Diabetes Father   . Glaucoma Father   . Myasthenia gravis Mother   . Other Mother        [place]  . Other Brother        devic disease NMO neuromyelitis optica  . Hashimoto's thyroiditis Daughter   . Psoriasis Sister   . Depression Sister   . Arthritis Other        parents     Social History   Socioeconomic History  . Marital status: Married    Spouse name: Not on file  . Number of children: Not on file  . Years of education: Not on file  . Highest education level: Not on file  Occupational History  . Not on  file  Tobacco Use  . Smoking status: Never Smoker  . Smokeless tobacco: Never Used  Vaping Use  . Vaping Use: Never used  Substance and Sexual Activity  . Alcohol use: Yes    Comment: Socially   . Drug use: No  . Sexual activity: Yes  Other Topics Concern  . Not on file  Social History Narrative    Married   No caffeine   Managing HH   Of 4    Pet dog   Regular exercise- yes  Not much in winter    BA degree      Social Determinants of Health   Financial Resource Strain: Not on file  Food Insecurity: Not on file  Transportation Needs: Not on file  Physical Activity: Not on file  Stress: Not on file  Social Connections: Not on file    Outpatient Medications Prior to Visit  Medication Sig Dispense Refill  . carbamazepine (CARBATROL) 200 MG 12 hr capsule TAKE 1 CAPSULE EVERY MORNING AND TAKE 2 CAPSULES AT BEDTIME    . HUMIRA PEN 40 MG/0.4ML PNKT SMARTSIG:40 Milligram(s) SUB-Q Every 2 Weeks    .  lamoTRIgine (LAMICTAL) 200 MG tablet Take 2 tablets by mouth daily.  4  . losartan (COZAAR) 100 MG tablet TAKE 1 TABLET BY MOUTH EVERY DAY 90 tablet 2  . traZODone (DESYREL) 50 MG tablet Take 50-150 mg by mouth at bedtime as needed for sleep.   3  . zolpidem (AMBIEN) 5 MG tablet Take 1 tablet (5 mg total) by mouth at bedtime as needed for sleep. 15 tablet 0  . aspirin EC 81 MG tablet Take 81 mg by mouth daily.    . fluorouracil (EFUDEX) 5 % cream Apply topically.    . lamoTRIgine (LAMICTAL) 100 MG tablet Take 200 mg by mouth daily.    [name] omeprazole (PRILOSEC) 40 MG capsule     . sucralfate (CARAFATE) 1 g tablet   0  . tacrolimus (PROTOPIC) 0.1 % ointment SMARTSIG:1 Topical Every Night     No facility-administered medications prior to visit.     EXAM:  BP 126/88 (BP Location: Left Arm, Patient Position: Sitting, Cuff Size: Large)   Temp 97.7 F (36.5 C) (Oral)   Ht 5' 6.5" (1.689 m)   Wt 188 lb 9.6 oz (85.5 kg)   BMI 29.98 kg/m   Body mass index is 29.98 kg/m. Wt Readings from Last 3 Encounters:  11/06/20 188 lb 9.6 oz (85.5 kg)  02/22/20 191 lb 9.6 oz (86.9 kg)  12/29/19 202 lb 3.2 oz (91.7 kg)    Physical Exam: Vital signs reviewed NWG:NFAO is a well-developed well-nourished alert cooperative    who appearsr stated age in no acute distress.  HEENT: normocephalic atraumatic , Eyes: PERRL EOM's full, conjunctiva 1+ pink watery  Some photophobia  Nares: paten,t no deformity discharge or tenderness., Ears: no deformity EAC's clear TMs with normal landmarks. Mouth:masked NECK: supple without masses,or bruits.  Thyroid no nodules felt  CHEST/PULM:  Clear to auscultation and percussion breath sounds equal no wheeze , rales or rhonchi. No chest wall deformities or tenderness. Breast: normal by inspection . No dimpling, discharge, masses, tenderness or discharge . CV: PMI is nondisplaced, S1 S2 no gallops, murmurs, rubs. Peripheral pulses are full without delay.No JVD .  ABDOMEN: Bowel  sounds normal nontender  No guard or rebound, no hepato splenomegal no CVA tenderness.   Extremtities:  No clubbing cyanosis or edema, no acute joint swelling or redness no focal  atrophy NEURO:  Oriented x3, cranial nerves 3-12 appear to be intact, no obvious focal weakness,gait within normal limits no abnormal reflexes or asymmetrical SKIN: No acute rashes normal turgor, color, no bruising or petechiae. PSYCH: Oriented, good eye contact, no obvious depression anxiety, cognition and judgment appear normal. LN: no cervical axillary inguinal adenopathy  Lab Results  Component Value Date   WBC 4.0 12/21/2019   HGB 10.5 (L) 12/21/2019   HCT 31.6 (L) 12/21/2019   PLT 262.0 12/21/2019   GLUCOSE 92 12/21/2019   CHOL 246 (H) 12/21/2019   TRIG 48.0 12/21/2019   HDL 133.80 12/21/2019   LDLDIRECT 51.0 09/16/2012   LDLCALC 102 (H) 12/21/2019   ALT 12 12/21/2019   AST 14 12/21/2019   NA 130 (L) 12/21/2019   K 4.4 12/21/2019   CL 96 12/21/2019   CREATININE 0.61 12/21/2019   BUN 10 12/21/2019   CO2 27 12/21/2019   TSH 0.59 12/21/2019   HGBA1C 5.3 01/24/2019    BP Readings from Last 3 Encounters:  11/06/20 126/88  02/22/20 126/82  12/29/19 140/88    Lab due soon  reviewed with patient   ASSESSMENT AND PLAN:  Discussed the following assessment and plan:    ICD-10-CM   1. Visit for preventive health examination  Z00.00 Basic metabolic panel    CBC with Differential/Platelet    Hepatic function panel    Lipid panel    TSH    T4, free    IBC + Ferritin    IBC + Ferritin    T4, free    TSH    Lipid panel    Hepatic function panel    CBC with Differential/Platelet    Basic metabolic panel  2. Medication management  Z79.899 Basic metabolic panel    CBC with Differential/Platelet    Hepatic function panel    Lipid panel    TSH    T4, free    IBC + Ferritin    IBC + Ferritin    T4, free    TSH    Lipid panel    Hepatic function panel    CBC with Differential/Platelet     Basic metabolic panel  3. Iron deficiency anemia, unspecified iron deficiency anemia type  D50.9 Basic metabolic panel    CBC with Differential/Platelet    Hepatic function panel    Lipid panel    TSH    T4, free    IBC + Ferritin    IBC + Ferritin    T4, free    TSH    Lipid panel    Hepatic function panel    CBC with Differential/Platelet    Basic metabolic panel  4. Hyperlipidemia, unspecified hyperlipidemia type  E78.5 Basic metabolic panel    CBC with Differential/Platelet    Hepatic function panel    Lipid panel    TSH    T4, free    IBC + Ferritin    IBC + Ferritin    T4, free    TSH    Lipid panel    Hepatic function panel    CBC with Differential/Platelet    Basic metabolic panel  5. Psoriatic arthritis (HCC)  L40.50 Basic metabolic panel    CBC with Differential/Platelet    Hepatic function panel    Lipid panel    TSH    T4, free    IBC + Ferritin    IBC + Ferritin    T4, free    TSH  Lipid panel    Hepatic function panel    CBC with Differential/Platelet    Basic metabolic panel  6. Bilateral eye symptoms  H57.9   7. Wears contact lenses  Z97.3      Lab today   Disc eye care and  Fu needed  Is sx from contact donet rapidly resolve.   Can become  Serious. Continue lifestyle intervention healthy eating and exercise .  Doing better  Disc getting pap  Before end of year    Menopausal  Had ablation   Return in about 1 year (around 11/06/2021) for depending on results .  Patient Care Team: Brach Birdsall, Neta Mends, MD as PCP - Jerelene Redden, MD as PCP - Cardiology (Cardiology) Cherly Hensen, MD (Psychiatry) Charna Elizabeth, MD as Consulting Physician (Gastroenterology) Patient Instructions   Get mammogram   Pap as discussed due by 12 22   Will send in eye drops but no contacts until healed and if  persistent or progressive pr recurrent see eye doc .  Contact problems  can become  more severe potentially cause  Cornea scarring.   Will do  form when  Labs back . shingrix 1 today   Repeat in 2-6 months  .    Health Maintenance, Female Adopting a healthy lifestyle and getting preventive care are important in promoting health and wellness. Ask your health care provider about:  The right schedule for you to have regular tests and exams.  Things you can do on your own to prevent diseases and keep yourself healthy. What should I know about diet, weight, and exercise? Eat a healthy diet  Eat a diet that includes plenty of vegetables, fruits, low-fat dairy products, and lean protein.  Do not eat a lot of foods that are high in solid fats, added sugars, or sodium.   Maintain a healthy weight Body mass index (BMI) is used to identify weight problems. It estimates body fat based on height and weight. Your health care provider can help determine your BMI and help you achieve or maintain a healthy weight. Get regular exercise Get regular exercise. This is one of the most important things you can do for your health. Most adults should:  Exercise for at least 150 minutes each week. The exercise should increase your heart rate and make you sweat (moderate-intensity exercise).  Do strengthening exercises at least twice a week. This is in addition to the moderate-intensity exercise.  Spend less time sitting. Even light physical activity can be beneficial. Watch cholesterol and blood lipids Have your blood tested for lipids and cholesterol at 57 years of age, then have this test every 5 years. Have your cholesterol levels checked more often if:  Your lipid or cholesterol levels are high.  You are older than 57 years of age.  You are at high risk for heart disease. What should I know about cancer screening? Depending on your health history and family history, you may need to have cancer screening at various ages. This may include screening for:  Breast cancer.  Cervical cancer.  Colorectal cancer.  Skin cancer.  Lung  cancer. What should I know about heart disease, diabetes, and high blood pressure? Blood pressure and heart disease  High blood pressure causes heart disease and increases the risk of stroke. This is more likely to develop in people who have high blood pressure readings, are of African descent, or are overweight.  Have your blood pressure checked: ? Every 3-5 years if you are  35-52 years of age. ? Every year if you are 3 years old or older. Diabetes Have regular diabetes screenings. This checks your fasting blood sugar level. Have the screening done:  Once every three years after age 39 if you are at a normal weight and have a low risk for diabetes.  More often and at a younger age if you are overweight or have a high risk for diabetes. What should I know about preventing infection? Hepatitis B If you have a higher risk for hepatitis B, you should be screened for this virus. Talk with your health care provider to find out if you are at risk for hepatitis B infection. Hepatitis C Testing is recommended for:  Everyone born from [birth year] through 1967.  Anyone with known risk factors for hepatitis C. Sexually transmitted infections (STIs)  Get screened for STIs, including gonorrhea and chlamydia, if: ? You are sexually active and are younger than 57 years of age. ? You are older than 57 years of age and your health care provider tells you that you are at risk for this type of infection. ? Your sexual activity has changed since you were last screened, and you are at increased risk for chlamydia or gonorrhea. Ask your health care provider if you are at risk.  Ask your health care provider about whether you are at high risk for HIV. Your health care provider may recommend a prescription medicine to help prevent HIV infection. If you choose to take medicine to prevent HIV, you should first get tested for HIV. You should then be tested every 3 months for as long as you are taking the  medicine. Pregnancy  If you are about to stop having your period (premenopausal) and you may become pregnant, seek counseling before you get pregnant.  Take 400 to 800 micrograms (mcg) of folic acid every day if you become pregnant.  Ask for birth control (contraception) if you want to prevent pregnancy. Osteoporosis and menopause Osteoporosis is a disease in which the bones lose minerals and strength with aging. This can result in bone fractures. If you are 39 years old or older, or if you are at risk for osteoporosis and fractures, ask your health care provider if you should:  Be screened for bone loss.  Take a calcium or vitamin D supplement to lower your risk of fractures.  Be given hormone replacement therapy (HRT) to treat symptoms of menopause. Follow these instructions at home: Lifestyle  Do not use any products that contain nicotine or tobacco, such as cigarettes, e-cigarettes, and chewing tobacco. If you need help quitting, ask your health care provider.  Do not use street drugs.  Do not share needles.  Ask your health care provider for help if you need support or information about quitting drugs. Alcohol use  Do not drink alcohol if: ? Your health care provider tells you not to drink. ? You are pregnant, may be pregnant, or are planning to become pregnant.  If you drink alcohol: ? Limit how much you use to 0-1 drink a day. ? Limit intake if you are breastfeeding.  Be aware of how much alcohol is in your drink. In the [place] one drink equals one 12 oz bottle of beer (355 mL), one 5 oz glass of wine (148 mL), or one 1 oz glass of hard liquor (44 mL). General instructions  Schedule regular health, dental, and eye exams.  Stay current with your vaccines.  Tell your health care provider if: ? You  often feel depressed. ? You have ever been abused or do not feel safe at home. Summary  Adopting a healthy lifestyle and getting preventive care are important in  promoting health and wellness.  Follow your health care provider's instructions about healthy diet, exercising, and getting tested or screened for diseases.  Follow your health care provider's instructions on monitoring your cholesterol and blood pressure. This information is not intended to replace advice given to you by your health care provider. Make sure you discuss any questions you have with your health care provider. Document Revised: 07/21/2018 Document Reviewed: 07/21/2018 Elsevier Patient Education  2021 [employer].    [place]. Aspin Palomarez M.D.

## 2020-11-06 ENCOUNTER — Other Ambulatory Visit: Payer: Self-pay

## 2020-11-06 ENCOUNTER — Ambulatory Visit (INDEPENDENT_AMBULATORY_CARE_PROVIDER_SITE_OTHER): Payer: BC Managed Care – PPO | Admitting: Internal Medicine

## 2020-11-06 ENCOUNTER — Encounter: Payer: Self-pay | Admitting: Internal Medicine

## 2020-11-06 VITALS — BP 126/88 | Temp 97.7°F | Ht 66.5 in | Wt 188.6 lb

## 2020-11-06 DIAGNOSIS — D509 Iron deficiency anemia, unspecified: Secondary | ICD-10-CM | POA: Diagnosis not present

## 2020-11-06 DIAGNOSIS — Z0000 Encounter for general adult medical examination without abnormal findings: Secondary | ICD-10-CM | POA: Diagnosis not present

## 2020-11-06 DIAGNOSIS — Z79899 Other long term (current) drug therapy: Secondary | ICD-10-CM | POA: Diagnosis not present

## 2020-11-06 DIAGNOSIS — Z23 Encounter for immunization: Secondary | ICD-10-CM

## 2020-11-06 DIAGNOSIS — E0590 Thyrotoxicosis, unspecified without thyrotoxic crisis or storm: Secondary | ICD-10-CM

## 2020-11-06 DIAGNOSIS — H579 Unspecified disorder of eye and adnexa: Secondary | ICD-10-CM

## 2020-11-06 DIAGNOSIS — E785 Hyperlipidemia, unspecified: Secondary | ICD-10-CM | POA: Diagnosis not present

## 2020-11-06 DIAGNOSIS — E871 Hypo-osmolality and hyponatremia: Secondary | ICD-10-CM

## 2020-11-06 DIAGNOSIS — D649 Anemia, unspecified: Secondary | ICD-10-CM

## 2020-11-06 DIAGNOSIS — Z973 Presence of spectacles and contact lenses: Secondary | ICD-10-CM

## 2020-11-06 DIAGNOSIS — L4050 Arthropathic psoriasis, unspecified: Secondary | ICD-10-CM

## 2020-11-06 LAB — CBC WITH DIFFERENTIAL/PLATELET
Basophils Absolute: 0.1 10*3/uL (ref 0.0–0.1)
Basophils Relative: 2.3 % (ref 0.0–3.0)
Eosinophils Absolute: 0 10*3/uL (ref 0.0–0.7)
Eosinophils Relative: 1.2 % (ref 0.0–5.0)
HCT: 34.4 % — ABNORMAL LOW (ref 36.0–46.0)
Hemoglobin: 11.5 g/dL — ABNORMAL LOW (ref 12.0–15.0)
Lymphocytes Relative: 34.3 % (ref 12.0–46.0)
Lymphs Abs: 0.8 10*3/uL (ref 0.7–4.0)
MCHC: 33.4 g/dL (ref 30.0–36.0)
MCV: 78.8 fl (ref 78.0–100.0)
Monocytes Absolute: 0.3 10*3/uL (ref 0.1–1.0)
Monocytes Relative: 14.1 % — ABNORMAL HIGH (ref 3.0–12.0)
Neutro Abs: 1.2 10*3/uL — ABNORMAL LOW (ref 1.4–7.7)
Neutrophils Relative %: 48.1 % (ref 43.0–77.0)
Platelets: 236 10*3/uL (ref 150.0–400.0)
RBC: 4.37 Mil/uL (ref 3.87–5.11)
RDW: 17.4 % — ABNORMAL HIGH (ref 11.5–15.5)
WBC: 2.4 10*3/uL — ABNORMAL LOW (ref 4.0–10.5)

## 2020-11-06 LAB — HEPATIC FUNCTION PANEL
ALT: 21 U/L (ref 0–35)
AST: 17 U/L (ref 0–37)
Albumin: 4.7 g/dL (ref 3.5–5.2)
Alkaline Phosphatase: 66 U/L (ref 39–117)
Bilirubin, Direct: 0.1 mg/dL (ref 0.0–0.3)
Total Bilirubin: 0.3 mg/dL (ref 0.2–1.2)
Total Protein: 7 g/dL (ref 6.0–8.3)

## 2020-11-06 LAB — LIPID PANEL
Cholesterol: 246 mg/dL — ABNORMAL HIGH (ref 0–200)
HDL: 116.1 mg/dL (ref >39.00)
LDL Cholesterol: 119 mg/dL — ABNORMAL HIGH (ref 0–99)
NonHDL: 130.17
Total CHOL/HDL Ratio: 2
Triglycerides: 56 mg/dL (ref 0.0–149.0)
VLDL: 11.2 mg/dL (ref 0.0–40.0)

## 2020-11-06 LAB — BASIC METABOLIC PANEL
BUN: 10 mg/dL (ref 6–23)
CO2: 27 mEq/L (ref 19–32)
Calcium: 9.6 mg/dL (ref 8.4–10.5)
Chloride: 94 mEq/L — ABNORMAL LOW (ref 96–112)
Creatinine, Ser: 0.73 mg/dL (ref 0.40–1.20)
GFR: 91.94 mL/min (ref >60.00)
Glucose, Bld: 86 mg/dL (ref 70–99)
Potassium: 4.5 mEq/L (ref 3.5–5.1)
Sodium: 129 mEq/L — ABNORMAL LOW (ref 135–145)

## 2020-11-06 LAB — IBC + FERRITIN
Ferritin: 11.2 ng/mL (ref 10.0–291.0)
Iron: 22 ug/dL — ABNORMAL LOW (ref 42–145)
Saturation Ratios: 4.3 % — ABNORMAL LOW (ref 20.0–50.0)
Transferrin: 365 mg/dL — ABNORMAL HIGH (ref 212.0–360.0)

## 2020-11-06 LAB — T4, FREE: Free T4: 0.98 ng/dL (ref 0.60–1.60)

## 2020-11-06 LAB — TSH: TSH: 0.52 u[IU]/mL (ref 0.35–4.50)

## 2020-11-06 MED ORDER — POLYMYXIN B-TRIMETHOPRIM 10000-0.1 UNIT/ML-% OP SOLN: 1.0000 [drp] | Freq: Four times a day (QID) | OPHTHALMIC | 0 refills | Status: DC

## 2020-11-06 NOTE — Patient Instructions (Addendum)
Get mammogram   Pap as discussed due by 12 22   Will send in eye drops but no contacts until healed and if  persistent or progressive pr recurrent see eye doc .  Contact problems  can become  more severe potentially cause  Cornea scarring.   Will do form when  Labs back . shingrix 1 today   Repeat in 2-6 months  .    Health Maintenance, Female Adopting a healthy lifestyle and getting preventive care are important in promoting health and wellness. Ask your health care provider about:  The right schedule for you to have regular tests and exams.  Things you can do on your own to prevent diseases and keep yourself healthy. What should I know about diet, weight, and exercise? Eat a healthy diet  Eat a diet that includes plenty of vegetables, fruits, low-fat dairy products, and lean protein.  Do not eat a lot of foods that are high in solid fats, added sugars, or sodium.   Maintain a healthy weight Body mass index (BMI) is used to identify weight problems. It estimates body fat based on height and weight. Your health care provider can help determine your BMI and help you achieve or maintain a healthy weight. Get regular exercise Get regular exercise. This is one of the most important things you can do for your health. Most adults should:  Exercise for at least 150 minutes each week. The exercise should increase your heart rate and make you sweat (moderate-intensity exercise).  Do strengthening exercises at least twice a week. This is in addition to the moderate-intensity exercise.  Spend less time sitting. Even light physical activity can be beneficial. Watch cholesterol and blood lipids Have your blood tested for lipids and cholesterol at 57 years of age, then have this test every 5 years. Have your cholesterol levels checked more often if:  Your lipid or cholesterol levels are high.  You are older than 57 years of age.  You are at high risk for heart disease. What should I know  about cancer screening? Depending on your health history and family history, you may need to have cancer screening at various ages. This may include screening for:  Breast cancer.  Cervical cancer.  Colorectal cancer.  Skin cancer.  Lung cancer. What should I know about heart disease, diabetes, and high blood pressure? Blood pressure and heart disease  High blood pressure causes heart disease and increases the risk of stroke. This is more likely to develop in people who have high blood pressure readings, are of African descent, or are overweight.  Have your blood pressure checked: ? Every 3-5 years if you are 70-56 years of age. ? Every year if you are 3 years old or older. Diabetes Have regular diabetes screenings. This checks your fasting blood sugar level. Have the screening done:  Once every three years after age 51 if you are at a normal weight and have a low risk for diabetes.  More often and at a younger age if you are overweight or have a high risk for diabetes. What should I know about preventing infection? Hepatitis B If you have a higher risk for hepatitis B, you should be screened for this virus. Talk with your health care provider to find out if you are at risk for hepatitis B infection. Hepatitis C Testing is recommended for:  Everyone born from [birth year] through 1965.  Anyone with known risk factors for hepatitis C. Sexually transmitted infections (STIs)  Get  screened for STIs, including gonorrhea and chlamydia, if: ? You are sexually active and are younger than 57 years of age. ? You are older than 57 years of age and your health care provider tells you that you are at risk for this type of infection. ? Your sexual activity has changed since you were last screened, and you are at increased risk for chlamydia or gonorrhea. Ask your health care provider if you are at risk.  Ask your health care provider about whether you are at high risk for HIV. Your health care  provider may recommend a prescription medicine to help prevent HIV infection. If you choose to take medicine to prevent HIV, you should first get tested for HIV. You should then be tested every 3 months for as long as you are taking the medicine. Pregnancy  If you are about to stop having your period (premenopausal) and you may become pregnant, seek counseling before you get pregnant.  Take 400 to 800 micrograms (mcg) of folic acid every day if you become pregnant.  Ask for birth control (contraception) if you want to prevent pregnancy. Osteoporosis and menopause Osteoporosis is a disease in which the bones lose minerals and strength with aging. This can result in bone fractures. If you are 38 years old or older, or if you are at risk for osteoporosis and fractures, ask your health care provider if you should:  Be screened for bone loss.  Take a calcium or vitamin D supplement to lower your risk of fractures.  Be given hormone replacement therapy (HRT) to treat symptoms of menopause. Follow these instructions at home: Lifestyle  Do not use any products that contain nicotine or tobacco, such as cigarettes, e-cigarettes, and chewing tobacco. If you need help quitting, ask your health care provider.  Do not use street drugs.  Do not share needles.  Ask your health care provider for help if you need support or information about quitting drugs. Alcohol use  Do not drink alcohol if: ? Your health care provider tells you not to drink. ? You are pregnant, may be pregnant, or are planning to become pregnant.  If you drink alcohol: ? Limit how much you use to 0-1 drink a day. ? Limit intake if you are breastfeeding.  Be aware of how much alcohol is in your drink. In the [place] one drink equals one 12 oz bottle of beer (355 mL), one 5 oz glass of wine (148 mL), or one 1 oz glass of hard liquor (44 mL). General instructions  Schedule regular health, dental, and eye exams.  Stay current  with your vaccines.  Tell your health care provider if: ? You often feel depressed. ? You have ever been abused or do not feel safe at home. Summary  Adopting a healthy lifestyle and getting preventive care are important in promoting health and wellness.  Follow your health care provider's instructions about healthy diet, exercising, and getting tested or screened for diseases.  Follow your health care provider's instructions on monitoring your cholesterol and blood pressure. This information is not intended to replace advice given to you by your health care provider. Make sure you discuss any questions you have with your health care provider. Document Revised: 07/21/2018 Document Reviewed: 07/21/2018 Elsevier Patient Education  2021 [employer].

## 2020-11-06 NOTE — Addendum Note (Signed)
Addended by: Christy Sartorius on: 11/06/2020 01:28 PM   Modules accepted: Orders

## 2020-11-07 ENCOUNTER — Other Ambulatory Visit (HOSPITAL_BASED_OUTPATIENT_CLINIC_OR_DEPARTMENT_OTHER): Payer: Self-pay | Admitting: Obstetrics & Gynecology

## 2020-11-07 DIAGNOSIS — Z1231 Encounter for screening mammogram for malignant neoplasm of breast: Secondary | ICD-10-CM

## 2020-11-08 ENCOUNTER — Telehealth: Payer: Self-pay | Admitting: Internal Medicine

## 2020-11-08 NOTE — Telephone Encounter (Signed)
Left a detailed voice message informing the patient that her forms were completed and faxed on 3/30.

## 2020-11-08 NOTE — Telephone Encounter (Signed)
Patient is calling and stated that she left a form for the provider to fill out that has to be completed and faxed by tomorrow. Pt wanted to confirm if this was done, please advise. CB is [phone]

## 2020-11-14 ENCOUNTER — Ambulatory Visit (HOSPITAL_BASED_OUTPATIENT_CLINIC_OR_DEPARTMENT_OTHER): Payer: BC Managed Care – PPO | Admitting: Radiology

## 2020-11-14 DIAGNOSIS — L401 Generalized pustular psoriasis: Secondary | ICD-10-CM | POA: Diagnosis not present

## 2020-11-14 DIAGNOSIS — R5382 Chronic fatigue, unspecified: Secondary | ICD-10-CM | POA: Diagnosis not present

## 2020-11-14 DIAGNOSIS — F3181 Bipolar II disorder: Secondary | ICD-10-CM | POA: Diagnosis not present

## 2020-11-14 DIAGNOSIS — M2550 Pain in unspecified joint: Secondary | ICD-10-CM | POA: Diagnosis not present

## 2020-11-14 DIAGNOSIS — L4059 Other psoriatic arthropathy: Secondary | ICD-10-CM | POA: Diagnosis not present

## 2020-11-14 NOTE — Progress Notes (Signed)
Iron level slightly low , anemia  still present but better . Liver BG normal  Sodium level still low area stable  could be from  medications Thryoid normal range  Blood count : low wbc this time again .  Should have a follow up .    Plan cbc diff ibc ferritin  BMP  in 2-3 months  dx anemia low wbc iron deficiency and  hyponatremia  Take iron supplement as tolerated   in the interim

## 2020-11-15 NOTE — Addendum Note (Signed)
Addended by: Christy Sartorius on: 11/15/2020 11:33 AM   Modules accepted: Orders

## 2020-11-21 ENCOUNTER — Ambulatory Visit (HOSPITAL_BASED_OUTPATIENT_CLINIC_OR_DEPARTMENT_OTHER): Payer: BC Managed Care – PPO | Admitting: Radiology

## 2020-11-21 DIAGNOSIS — F3181 Bipolar II disorder: Secondary | ICD-10-CM | POA: Diagnosis not present

## 2020-11-29 ENCOUNTER — Ambulatory Visit (HOSPITAL_BASED_OUTPATIENT_CLINIC_OR_DEPARTMENT_OTHER): Admission: RE | Admit: 2020-11-29 | Payer: BC Managed Care – PPO | Source: Ambulatory Visit | Admitting: Radiology

## 2020-12-12 DIAGNOSIS — F3181 Bipolar II disorder: Secondary | ICD-10-CM | POA: Diagnosis not present

## 2020-12-14 ENCOUNTER — Other Ambulatory Visit: Payer: Self-pay | Admitting: Internal Medicine

## 2020-12-19 DIAGNOSIS — F3181 Bipolar II disorder: Secondary | ICD-10-CM | POA: Diagnosis not present

## 2020-12-26 DIAGNOSIS — F3181 Bipolar II disorder: Secondary | ICD-10-CM | POA: Diagnosis not present

## 2021-01-09 DIAGNOSIS — Z20822 Contact with and (suspected) exposure to covid-19: Secondary | ICD-10-CM | POA: Diagnosis not present

## 2021-01-24 ENCOUNTER — Ambulatory Visit (HOSPITAL_BASED_OUTPATIENT_CLINIC_OR_DEPARTMENT_OTHER): Payer: BC Managed Care – PPO | Admitting: Obstetrics & Gynecology

## 2021-01-24 ENCOUNTER — Ambulatory Visit (HOSPITAL_BASED_OUTPATIENT_CLINIC_OR_DEPARTMENT_OTHER): Payer: BC Managed Care – PPO | Admitting: Radiology

## 2021-01-30 DIAGNOSIS — F3181 Bipolar II disorder: Secondary | ICD-10-CM | POA: Diagnosis not present

## 2021-02-05 ENCOUNTER — Ambulatory Visit (HOSPITAL_BASED_OUTPATIENT_CLINIC_OR_DEPARTMENT_OTHER)
Admission: RE | Admit: 2021-02-05 | Discharge: 2021-02-05 | Disposition: A | Discharge status: Home / Self Care | Payer: BC Managed Care – PPO | Source: Ambulatory Visit | Attending: Obstetrics & Gynecology | Admitting: Obstetrics & Gynecology

## 2021-02-05 ENCOUNTER — Other Ambulatory Visit: Payer: Self-pay

## 2021-02-05 DIAGNOSIS — Z1231 Encounter for screening mammogram for malignant neoplasm of breast: Secondary | ICD-10-CM | POA: Insufficient documentation

## 2021-02-13 DIAGNOSIS — M79642 Pain in left hand: Secondary | ICD-10-CM | POA: Diagnosis not present

## 2021-02-13 DIAGNOSIS — L4059 Other psoriatic arthropathy: Secondary | ICD-10-CM | POA: Diagnosis not present

## 2021-02-13 DIAGNOSIS — R5382 Chronic fatigue, unspecified: Secondary | ICD-10-CM | POA: Diagnosis not present

## 2021-02-13 DIAGNOSIS — L401 Generalized pustular psoriasis: Secondary | ICD-10-CM | POA: Diagnosis not present

## 2021-02-26 ENCOUNTER — Ambulatory Visit (HOSPITAL_BASED_OUTPATIENT_CLINIC_OR_DEPARTMENT_OTHER): Payer: BC Managed Care – PPO

## 2021-03-06 DIAGNOSIS — F3181 Bipolar II disorder: Secondary | ICD-10-CM | POA: Diagnosis not present

## 2021-03-20 DIAGNOSIS — F3181 Bipolar II disorder: Secondary | ICD-10-CM | POA: Diagnosis not present

## 2021-03-26 DIAGNOSIS — K5904 Chronic idiopathic constipation: Secondary | ICD-10-CM | POA: Diagnosis not present

## 2021-03-26 DIAGNOSIS — Z800 Family history of malignant neoplasm of digestive organs: Secondary | ICD-10-CM | POA: Diagnosis not present

## 2021-03-26 DIAGNOSIS — D509 Iron deficiency anemia, unspecified: Secondary | ICD-10-CM | POA: Diagnosis not present

## 2021-03-26 DIAGNOSIS — Z86010 Personal history of colonic polyps: Secondary | ICD-10-CM | POA: Diagnosis not present

## 2021-03-26 DIAGNOSIS — K219 Gastro-esophageal reflux disease without esophagitis: Secondary | ICD-10-CM | POA: Diagnosis not present

## 2021-04-11 ENCOUNTER — Ambulatory Visit (HOSPITAL_BASED_OUTPATIENT_CLINIC_OR_DEPARTMENT_OTHER): Payer: BC Managed Care – PPO | Admitting: Obstetrics & Gynecology

## 2021-04-17 DIAGNOSIS — F3181 Bipolar II disorder: Secondary | ICD-10-CM | POA: Diagnosis not present

## 2021-04-24 ENCOUNTER — Other Ambulatory Visit: Payer: Self-pay

## 2021-04-24 ENCOUNTER — Other Ambulatory Visit (HOSPITAL_COMMUNITY)
Admission: RE | Admit: 2021-04-24 | Discharge: 2021-04-24 | Disposition: A | Discharge status: Home / Self Care | Payer: BC Managed Care – PPO | Source: Ambulatory Visit | Attending: Obstetrics & Gynecology | Admitting: Obstetrics & Gynecology

## 2021-04-24 ENCOUNTER — Encounter (HOSPITAL_BASED_OUTPATIENT_CLINIC_OR_DEPARTMENT_OTHER): Payer: Self-pay | Admitting: Obstetrics & Gynecology

## 2021-04-24 ENCOUNTER — Ambulatory Visit (INDEPENDENT_AMBULATORY_CARE_PROVIDER_SITE_OTHER): Payer: BC Managed Care – PPO | Admitting: Obstetrics & Gynecology

## 2021-04-24 VITALS — BP 192/82 | HR 70 | Ht 66.0 in | Wt 189.4 lb

## 2021-04-24 DIAGNOSIS — Z124 Encounter for screening for malignant neoplasm of cervix: Secondary | ICD-10-CM | POA: Insufficient documentation

## 2021-04-24 DIAGNOSIS — R030 Elevated blood-pressure reading, without diagnosis of hypertension: Secondary | ICD-10-CM

## 2021-04-24 DIAGNOSIS — N959 Unspecified menopausal and perimenopausal disorder: Secondary | ICD-10-CM | POA: Diagnosis not present

## 2021-04-24 DIAGNOSIS — L4050 Arthropathic psoriasis, unspecified: Secondary | ICD-10-CM

## 2021-04-24 DIAGNOSIS — Z79890 Hormone replacement therapy (postmenopausal): Secondary | ICD-10-CM | POA: Diagnosis not present

## 2021-04-24 DIAGNOSIS — Z01419 Encounter for gynecological examination (general) (routine) without abnormal findings: Secondary | ICD-10-CM

## 2021-04-24 MED ORDER — PROGESTERONE 200 MG PO CAPS
200.0000 mg | ORAL_CAPSULE | Freq: Every day | ORAL | 2 refills | Status: DC
Start: 2021-04-24 — End: 2021-11-25

## 2021-04-24 MED ORDER — EST ESTROGENS-METHYLTEST 0.625-1.25 MG PO TABS: 1.0000 | ORAL_TABLET | Freq: Every day | ORAL | 3 refills | Status: DC

## 2021-04-24 NOTE — Patient Instructions (Signed)
[facility]  Dr. Quillian Quince

## 2021-04-24 NOTE — Progress Notes (Signed)
55 y.o. G18P4 Married White or Caucasian female here for annual exam/new patient exam.  Denies vaginal bleeding.  H/o endometrial ablation.  Really hasn't had bleeding since that time.  She was on HRT for a short while.  Feels her hormones are "wacked".  Weight is hard to control.  Libido is very low.  She just feels "off".  Doesn't have hot flashes.  Would like to consider restarting HRT.  Would like to work on weight.  Information about Healthy Weight and Wellness discussed with pt.  Has bipolar d/o followed by Dr. Quintella Reichert, in [facility].    No LMP recorded. Patient has had an ablation.          Sexually active: Yes.    The current method of family planning is post menopausal status.    Exercising: Yes.    [place] cycling Smoker:  no  Health Maintenance: Pap:  07/30/2016  History of abnormal Pap:  no MMG:  02/05/2021 Negative Colonoscopy:  06/16/2018, follow up 7 years but has another one scheduled due to newer symptoms TDaP:  2013 Shingrix:   09/2020, has not finished.  Discussed today Hep C testing: with rheumatology Screening Labs: 11/2020   reports that she has never smoked. She has never used smokeless tobacco. She reports current alcohol use. She reports that she does not use drugs.  Past Medical History:  Diagnosis Date   ADD (attention deficit disorder)    poss   Depression    Mood disorder (HCC)    Psoriasis    Psoriatic arthritis (HCC) 10/2012    Past Surgical History:  Procedure Laterality Date   ABDOMINOPLASTY  2002   BUNIONECTOMY     bone spur left foot   ENDOMETRIAL ABLATION  2015   UPPER GI ENDOSCOPY  06/16/2018   see report    Current Outpatient Medications  Medication Sig Dispense Refill   carbamazepine (CARBATROL) 200 MG 12 hr capsule TAKE 1 CAPSULE EVERY MORNING AND TAKE 2 CAPSULES AT BEDTIME     estrogen-methylTESTOSTERone 0.625-1.25 MG tablet Take 1 tablet by mouth daily. 30 tablet 3   HUMIRA PEN 40 MG/0.4ML PNKT SMARTSIG:40 Milligram(s) SUB-Q Every  2 Weeks     lamoTRIgine (LAMICTAL) 200 MG tablet Take 2 tablets by mouth daily.  4   losartan (COZAAR) 100 MG tablet TAKE 1 TABLET BY MOUTH EVERY DAY 90 tablet 2   progesterone (PROMETRIUM) 200 MG capsule Take 1 capsule (200 mg total) by mouth daily. 30 capsule 2   traZODone (DESYREL) 50 MG tablet Take 50-150 mg by mouth at bedtime as needed for sleep.   3   zolpidem (AMBIEN) 5 MG tablet Take 1 tablet (5 mg total) by mouth at bedtime as needed for sleep. 15 tablet 0   meloxicam (MOBIC) 15 MG tablet Take 1 tablet (15 mg total) by mouth daily. 21 tablet 0   sulfamethoxazole-trimethoprim (BACTRIM DS) 800-160 MG tablet Take 1 tablet by mouth 2 (two) times daily for 5 days. 10 tablet 0   trimethoprim-polymyxin b (POLYTRIM) ophthalmic solution Place 1 drop into both eyes every 6 (six) hours. (Patient not taking: Reported on 04/24/2021) 10 mL 0   No current facility-administered medications for this visit.    Family History  Problem Relation Age of Onset   Other Mother    Myasthenia gravis Mother    Diabetes Father    Glaucoma Father    Psoriasis Sister    Depression Sister    Other Brother  optic disease NMO neuromyelitis optica   Hashimoto's thyroiditis Daughter    Anxiety disorder Other    Arthritis Other        parents     Review of Systems  All other systems reviewed and are negative.  Exam:   BP (!) 192/82 (BP Location: Left Arm, Patient Position: Sitting, Cuff Size: Large)   Pulse 70   Ht 5\' 6"  (1.676 m)   Wt 189 lb 6.4 oz (85.9 kg)   BMI 30.57 kg/m   Height: 5\' 6"  (167.6 cm)  General appearance: alert, cooperative and appears stated age Head: Normocephalic, without obvious abnormality, atraumatic Neck: no adenopathy, supple, symmetrical, trachea midline and thyroid normal to inspection and palpation Lungs: clear to auscultation bilaterally Breasts: normal appearance, no masses or tenderness Heart: regular rate and rhythm Abdomen: soft, non-tender; bowel sounds  normal; no masses,  no organomegaly Extremities: extremities normal, atraumatic, no cyanosis or edema Skin: Skin color, texture, turgor normal. No rashes or lesions Lymph nodes: Cervical, supraclavicular, and axillary nodes normal. No abnormal inguinal nodes palpated Neurologic: Grossly normal   Pelvic: External genitalia:  no lesions              Urethra:  normal appearing urethra with no masses, tenderness or lesions              Bartholins and Skenes: normal                 Vagina: normal appearing vagina with normal color and no discharge, no lesions              Cervix: no lesions              Pap taken: Yes.   Bimanual Exam:  Uterus:  normal size, contour, position, consistency, mobility, non-tender              Adnexa: normal adnexa and no mass, fullness, tenderness               Rectovaginal: Confirms               Anus:  normal sphincter tone, no lesions  Chaperone, , CMA, was present for exam.  Assessment/Plan: 1. Well woman exam with routine gynecological exam - pap and HR HPV obtained today - MMG 02/05/2021 - colonoscoyp 2019 but repeat is planned - BMD not needed yet - Vaccines reviewed.  Started shingrix but has not completed.  Advised to just finish and not restart series. - lab work done with rheumatology and Dr. 02/07/2021  2. Postmenopausal symptoms - will start HRT with progesterone (PROMETRIUM) 200 MG capsule; Take 1 capsule (200 mg total) by mouth daily.  Dispense: 30 capsule; Refill: 2 - estrogen-methylTESTOSTERone 0.625-1.25 MG tablet; Take 1 tablet by mouth daily.  Dispense: 30 tablet; Refill: 3 - risks/side effects reviewed - recheck 2-3 months  3. Hormone replacement therapy (HRT)  4. Psoriatic arthritis (HCC) - followed by rheumatology  5. Elevated blood pressure reading - advised to follow up with Dr. Fabian Sharp if still elevated outside office

## 2021-04-29 ENCOUNTER — Telehealth: Payer: Self-pay | Admitting: *Deleted

## 2021-04-29 ENCOUNTER — Ambulatory Visit (INDEPENDENT_AMBULATORY_CARE_PROVIDER_SITE_OTHER): Payer: BC Managed Care – PPO | Admitting: Sports Medicine

## 2021-04-29 ENCOUNTER — Other Ambulatory Visit: Payer: Self-pay

## 2021-04-29 ENCOUNTER — Ambulatory Visit (INDEPENDENT_AMBULATORY_CARE_PROVIDER_SITE_OTHER): Payer: BC Managed Care – PPO

## 2021-04-29 VITALS — BP 162/90 | HR 81 | Wt 189.0 lb

## 2021-04-29 DIAGNOSIS — M79662 Pain in left lower leg: Secondary | ICD-10-CM

## 2021-04-29 DIAGNOSIS — S80812A Abrasion, left lower leg, initial encounter: Secondary | ICD-10-CM | POA: Diagnosis not present

## 2021-04-29 LAB — CYTOLOGY - PAP
Comment: NEGATIVE
Diagnosis: NEGATIVE
High risk HPV: NEGATIVE

## 2021-04-29 MED ORDER — SULFAMETHOXAZOLE-TRIMETHOPRIM 800-160 MG PO TABS: 1.0000 | ORAL_TABLET | Freq: Two times a day (BID) | ORAL | 0 refills | Status: AC

## 2021-04-29 MED ORDER — MELOXICAM 15 MG PO TABS: 15.0000 mg | ORAL_TABLET | Freq: Every day | ORAL | 0 refills | Status: DC
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
## 2021-04-29 NOTE — Telephone Encounter (Signed)
Pt called stating she is beginning to have chills and her wound is now warm to touch. She would like an abx called into [facility] on [address].

## 2021-04-29 NOTE — Patient Instructions (Signed)
Thank you for coming in today Start meloxicam 15 mg every morning for 1 week.  Take with food.  May use remaining 2 weeks of medication as needed for pain control Relative rest Keep abrasions clean and covered when in public, use triple antibiotic ointment.  Contact clinic if you start to have purulent drainage and/or new redness surrounding abrasion areas. Follow-up as needed if no improvement or worsening of symptoms.

## 2021-04-30 ENCOUNTER — Encounter (HOSPITAL_BASED_OUTPATIENT_CLINIC_OR_DEPARTMENT_OTHER): Payer: Self-pay | Admitting: Obstetrics & Gynecology

## 2021-04-30 DIAGNOSIS — Z79890 Hormone replacement therapy (postmenopausal): Secondary | ICD-10-CM | POA: Insufficient documentation

## 2021-05-01 DIAGNOSIS — F3181 Bipolar II disorder: Secondary | ICD-10-CM | POA: Diagnosis not present

## 2021-05-15 ENCOUNTER — Other Ambulatory Visit: Payer: Self-pay | Admitting: Sports Medicine

## 2021-06-05 DIAGNOSIS — F3181 Bipolar II disorder: Secondary | ICD-10-CM | POA: Diagnosis not present

## 2021-06-19 DIAGNOSIS — F3181 Bipolar II disorder: Secondary | ICD-10-CM | POA: Diagnosis not present

## 2021-07-08 DIAGNOSIS — R37 Sexual dysfunction, unspecified: Secondary | ICD-10-CM | POA: Diagnosis not present

## 2021-07-08 DIAGNOSIS — K317 Polyp of stomach and duodenum: Secondary | ICD-10-CM | POA: Diagnosis not present

## 2021-07-08 DIAGNOSIS — K2100 Gastro-esophageal reflux disease with esophagitis, without bleeding: Secondary | ICD-10-CM | POA: Diagnosis not present

## 2021-07-08 DIAGNOSIS — D509 Iron deficiency anemia, unspecified: Secondary | ICD-10-CM | POA: Diagnosis not present

## 2021-07-08 DIAGNOSIS — F3181 Bipolar II disorder: Secondary | ICD-10-CM | POA: Diagnosis not present

## 2021-07-08 DIAGNOSIS — Z1211 Encounter for screening for malignant neoplasm of colon: Secondary | ICD-10-CM | POA: Diagnosis not present

## 2021-07-08 DIAGNOSIS — K449 Diaphragmatic hernia without obstruction or gangrene: Secondary | ICD-10-CM | POA: Diagnosis not present

## 2021-07-08 LAB — HM COLONOSCOPY

## 2021-07-10 DIAGNOSIS — F3181 Bipolar II disorder: Secondary | ICD-10-CM | POA: Diagnosis not present

## 2021-07-15 ENCOUNTER — Ambulatory Visit (INDEPENDENT_AMBULATORY_CARE_PROVIDER_SITE_OTHER): Payer: BC Managed Care – PPO | Admitting: Obstetrics & Gynecology

## 2021-07-15 ENCOUNTER — Encounter (HOSPITAL_BASED_OUTPATIENT_CLINIC_OR_DEPARTMENT_OTHER): Payer: Self-pay | Admitting: Obstetrics & Gynecology

## 2021-07-15 ENCOUNTER — Other Ambulatory Visit: Payer: Self-pay

## 2021-07-15 VITALS — BP 153/84 | HR 77 | Ht 67.0 in | Wt 194.4 lb

## 2021-07-15 DIAGNOSIS — Z79890 Hormone replacement therapy (postmenopausal): Secondary | ICD-10-CM

## 2021-07-18 ENCOUNTER — Ambulatory Visit (HOSPITAL_BASED_OUTPATIENT_CLINIC_OR_DEPARTMENT_OTHER): Payer: BC Managed Care – PPO | Admitting: Obstetrics & Gynecology

## 2021-07-18 NOTE — Progress Notes (Signed)
GYNECOLOGY  VISIT  CC:   discuss HRT  HPI: 55 y.o. G97P4 Married White or Caucasian female here for recheck after starting HRT after 04/24/2021 new patient appointment.  At that time, pt reported she felt her hormones were not normal with decreased libido.  She did not have hot flashes.  She had use HRT in the past and felt this might help symptoms.  After discussion of risks and benefits, decided to start estratest, standard dosing, and Prometrium at night.  She started soon after that appointment.  Pt reports none of her symptoms are improved.  Does not feel like testosterone has helped at all with libido.  As was not having hot flashes, this has not changed.  Does not feel has changes emotions.   Given dosage, I do not make a dosage change will make a difference at this is a standard HRT dosage.  As well, there are risks for continuing her HRT with no clear benefit.  After discussion, decided it would be best to stop HRT as has not helped any symptoms.  Pt comfortable with plan.  GYNECOLOGIC HISTORY: No LMP recorded. Patient has had an ablation.   Patient Active Problem List   Diagnosis Date Noted   Hormone replacement therapy (HRT) 04/30/2021   Gastric ulcer 07/13/2018   Goiter 01/25/2018   Thyroid nodule 01/25/2018   Subclinical hyperthyroidism 01/25/2018   Elevated serum cholesterol high HDL 12/26/2013   Tired 12/26/2013   Low vitamin B12 level 12/26/2013   Cough after eating 12/26/2013   Psoriatic arthritis (HCC) 08/15/2013   Multiple joint pain 09/16/2012   Morning joint stiffness 09/16/2012   Visit for preventive health examination 09/09/2011   Psoriasis    DEPRESSION, SITUATIONAL 09/28/2007    Past Medical History:  Diagnosis Date   ADD (attention deficit disorder)    poss   Depression    Mood disorder (HCC)    Psoriasis    Psoriatic arthritis (HCC) 10/2012    Past Surgical History:  Procedure Laterality Date   ABDOMINOPLASTY  2002   BUNIONECTOMY     bone spur  left foot   ENDOMETRIAL ABLATION  2015   UPPER GI ENDOSCOPY  06/16/2018   see report    MEDS:   Current Outpatient Medications on File Prior to Visit  Medication Sig Dispense Refill   carbamazepine (CARBATROL) 200 MG 12 hr capsule TAKE 1 CAPSULE EVERY MORNING AND TAKE 2 CAPSULES AT BEDTIME     estrogen-methylTESTOSTERone 0.625-1.25 MG tablet Take 1 tablet by mouth daily. 30 tablet 3   HUMIRA PEN 40 MG/0.4ML PNKT SMARTSIG:40 Milligram(s) SUB-Q Every 2 Weeks     lamoTRIgine (LAMICTAL) 200 MG tablet Take 2 tablets by mouth daily.  4   losartan (COZAAR) 100 MG tablet TAKE 1 TABLET BY MOUTH EVERY DAY 90 tablet 2   progesterone (PROMETRIUM) 200 MG capsule Take 1 capsule (200 mg total) by mouth daily. 30 capsule 2   traZODone (DESYREL) 50 MG tablet Take 50-150 mg by mouth at bedtime as needed for sleep.   3   zolpidem (AMBIEN) 5 MG tablet Take 1 tablet (5 mg total) by mouth at bedtime as needed for sleep. 15 tablet 0   meloxicam (MOBIC) 15 MG tablet Take 1 tablet (15 mg total) by mouth daily. (Patient not taking: Reported on 07/15/2021) 21 tablet 0   trimethoprim-polymyxin b (POLYTRIM) ophthalmic solution Place 1 drop into both eyes every 6 (six) hours. (Patient not taking: Reported on 04/24/2021) 10 mL 0   No  current facility-administered medications on file prior to visit.    ALLERGIES: Compazine  Family History  Problem Relation Age of Onset   Other Mother    Myasthenia gravis Mother    Diabetes Father    Glaucoma Father    Psoriasis Sister    Depression Sister    Other Brother        optic disease NMO neuromyelitis optica   Hashimoto's thyroiditis Daughter    Anxiety disorder Other    Arthritis Other        parents     SH:  married, non smoker  Review of Systems  Constitutional: Negative.   Genitourinary: Negative.    PHYSICAL EXAMINATION:    BP (!) 153/84 (BP Location: Right Arm, Patient Position: Sitting, Cuff Size: Large)   Pulse 77   Ht 5\' 7"  (1.702 m) Comment:  reported  Wt 194 lb 6.4 oz (88.2 kg)   BMI 30.45 kg/m     General appearance: alert, cooperative and appears stated age No other physical exam performed  Assessment/Plan: 1. Hormone replacement therapy (HRT) - decided to stop today after discussion regarding no changes in symptoms.

## 2021-07-22 ENCOUNTER — Encounter: Payer: Self-pay | Admitting: Internal Medicine

## 2021-08-19 DIAGNOSIS — R5382 Chronic fatigue, unspecified: Secondary | ICD-10-CM | POA: Diagnosis not present

## 2021-08-19 DIAGNOSIS — L401 Generalized pustular psoriasis: Secondary | ICD-10-CM | POA: Diagnosis not present

## 2021-08-19 DIAGNOSIS — M2550 Pain in unspecified joint: Secondary | ICD-10-CM | POA: Diagnosis not present

## 2021-08-19 DIAGNOSIS — L4059 Other psoriatic arthropathy: Secondary | ICD-10-CM | POA: Diagnosis not present

## 2021-08-19 LAB — BASIC METABOLIC PANEL
BUN: 9 (ref 4–21)
Chloride: 96 — AB (ref 99–108)
Creatinine: 0.6 (ref 0.5–1.1)
Glucose: 86
Potassium: 4.2 (ref 3.4–5.3)
Sodium: 130 — AB (ref 137–147)

## 2021-08-19 LAB — HEPATIC FUNCTION PANEL
ALT: 19 (ref 7–35)
AST: 22 (ref 13–35)
Alkaline Phosphatase: 69 (ref 25–125)
Bilirubin, Total: 0.2

## 2021-08-19 LAB — COMPREHENSIVE METABOLIC PANEL
Albumin: 4.7 (ref 3.5–5.0)
Calcium: 9.2 (ref 8.7–10.7)
GFR calc non Af Amer: 103
Globulin: 2.2

## 2021-08-19 LAB — CBC: RBC: 3.83 — AB (ref 3.87–5.11)

## 2021-08-21 DIAGNOSIS — L814 Other melanin hyperpigmentation: Secondary | ICD-10-CM | POA: Diagnosis not present

## 2021-08-22 DIAGNOSIS — D0439 Carcinoma in situ of skin of other parts of face: Secondary | ICD-10-CM | POA: Diagnosis not present

## 2021-08-22 LAB — CBC AND DIFFERENTIAL
HCT: 30 — AB (ref 36–46)
Hemoglobin: 9.5 — AB (ref 12.0–16.0)
Neutrophils Absolute: 1.5
Platelets: 250 (ref 150–399)
WBC: 3.3

## 2021-08-30 ENCOUNTER — Encounter: Payer: Self-pay | Admitting: Internal Medicine

## 2021-09-11 DIAGNOSIS — F3181 Bipolar II disorder: Secondary | ICD-10-CM | POA: Diagnosis not present

## 2021-09-14 ENCOUNTER — Other Ambulatory Visit: Payer: Self-pay | Admitting: Internal Medicine

## 2021-09-25 DIAGNOSIS — F3181 Bipolar II disorder: Secondary | ICD-10-CM | POA: Diagnosis not present

## 2021-10-22 ENCOUNTER — Telehealth: Payer: Self-pay | Admitting: Internal Medicine

## 2021-10-22 DIAGNOSIS — Z79899 Other long term (current) drug therapy: Secondary | ICD-10-CM

## 2021-10-22 DIAGNOSIS — D649 Anemia, unspecified: Secondary | ICD-10-CM

## 2021-10-22 DIAGNOSIS — E0590 Thyrotoxicosis, unspecified without thyrotoxic crisis or storm: Secondary | ICD-10-CM

## 2021-10-22 DIAGNOSIS — E785 Hyperlipidemia, unspecified: Secondary | ICD-10-CM

## 2021-10-22 DIAGNOSIS — L4050 Arthropathic psoriasis, unspecified: Secondary | ICD-10-CM

## 2021-10-22 DIAGNOSIS — Z0000 Encounter for general adult medical examination without abnormal findings: Secondary | ICD-10-CM

## 2021-10-22 DIAGNOSIS — D509 Iron deficiency anemia, unspecified: Secondary | ICD-10-CM

## 2021-10-22 NOTE — Telephone Encounter (Signed)
Lab orders placed ?Fasting if possible .  ?

## 2021-10-22 NOTE — Addendum Note (Signed)
Addended byBerniece Andreas K on: 10/22/2021 04:40 PM ? ? Modules accepted: Orders ? ?

## 2021-10-22 NOTE — Telephone Encounter (Signed)
Pt is calling and has cpe sch for [phone] and would like to have her cpe labs in advance. Please advise ?

## 2021-10-22 NOTE — Telephone Encounter (Signed)
Please advise 

## 2021-11-25 ENCOUNTER — Encounter: Payer: Self-pay | Admitting: Internal Medicine

## 2021-11-25 ENCOUNTER — Ambulatory Visit (INDEPENDENT_AMBULATORY_CARE_PROVIDER_SITE_OTHER): Payer: BC Managed Care – PPO | Admitting: Internal Medicine

## 2021-11-25 VITALS — BP 126/84 | HR 81 | Temp 98.6°F | Ht 66.5 in | Wt 195.6 lb

## 2021-11-25 DIAGNOSIS — Z79899 Other long term (current) drug therapy: Secondary | ICD-10-CM

## 2021-11-25 DIAGNOSIS — Z0000 Encounter for general adult medical examination without abnormal findings: Secondary | ICD-10-CM | POA: Diagnosis not present

## 2021-11-25 DIAGNOSIS — E0590 Thyrotoxicosis, unspecified without thyrotoxic crisis or storm: Secondary | ICD-10-CM | POA: Diagnosis not present

## 2021-11-25 DIAGNOSIS — E785 Hyperlipidemia, unspecified: Secondary | ICD-10-CM | POA: Diagnosis not present

## 2021-11-25 DIAGNOSIS — Z6831 Body mass index (BMI) 31.0-31.9, adult: Secondary | ICD-10-CM

## 2021-11-25 DIAGNOSIS — D649 Anemia, unspecified: Secondary | ICD-10-CM

## 2021-11-25 DIAGNOSIS — L4050 Arthropathic psoriasis, unspecified: Secondary | ICD-10-CM

## 2021-11-25 DIAGNOSIS — E669 Obesity, unspecified: Secondary | ICD-10-CM

## 2021-11-25 DIAGNOSIS — R635 Abnormal weight gain: Secondary | ICD-10-CM

## 2021-11-25 LAB — TSH: TSH: 0.34 u[IU]/mL — ABNORMAL LOW (ref 0.35–5.50)

## 2021-11-25 LAB — CBC WITH DIFFERENTIAL/PLATELET
Basophils Absolute: 0.1 K/uL (ref 0.0–0.1)
Basophils Relative: 1.2 % (ref 0.0–3.0)
Eosinophils Absolute: 0 K/uL (ref 0.0–0.7)
Eosinophils Relative: 0.7 % (ref 0.0–5.0)
HCT: 30.4 % — ABNORMAL LOW (ref 36.0–46.0)
Hemoglobin: 10.2 g/dL — ABNORMAL LOW (ref 12.0–15.0)
Lymphocytes Relative: 27.9 % (ref 12.0–46.0)
Lymphs Abs: 1.2 K/uL (ref 0.7–4.0)
MCHC: 33.6 g/dL (ref 30.0–36.0)
MCV: 77.4 fl — ABNORMAL LOW (ref 78.0–100.0)
Monocytes Absolute: 0.5 K/uL (ref 0.1–1.0)
Monocytes Relative: 12.6 % — ABNORMAL HIGH (ref 3.0–12.0)
Neutro Abs: 2.4 K/uL (ref 1.4–7.7)
Neutrophils Relative %: 57.6 % (ref 43.0–77.0)
Platelets: 294 K/uL (ref 150.0–400.0)
RBC: 3.92 Mil/uL (ref 3.87–5.11)
RDW: 17.5 % — ABNORMAL HIGH (ref 11.5–15.5)
WBC: 4.2 K/uL (ref 4.0–10.5)

## 2021-11-25 LAB — LIPID PANEL
Cholesterol: 252 mg/dL — ABNORMAL HIGH (ref 0–200)
HDL: 135.8 mg/dL (ref >39.00)
LDL Cholesterol: 105 mg/dL — ABNORMAL HIGH (ref 0–99)
NonHDL: 115.88
Total CHOL/HDL Ratio: 2
Triglycerides: 55 mg/dL (ref 0.0–149.0)
VLDL: 11 mg/dL (ref 0.0–40.0)

## 2021-11-25 LAB — BASIC METABOLIC PANEL
BUN: 12 mg/dL (ref 6–23)
CO2: 25 mEq/L (ref 19–32)
Calcium: 9.3 mg/dL (ref 8.4–10.5)
Chloride: 94 mEq/L — ABNORMAL LOW (ref 96–112)
Creatinine, Ser: 0.68 mg/dL (ref 0.40–1.20)
GFR: 96.65 mL/min (ref >60.00)
Glucose, Bld: 83 mg/dL (ref 70–99)
Potassium: 4.6 mEq/L (ref 3.5–5.1)
Sodium: 128 mEq/L — ABNORMAL LOW (ref 135–145)

## 2021-11-25 LAB — T4, FREE: Free T4: 0.97 ng/dL (ref 0.60–1.60)

## 2021-11-25 LAB — HEMOGLOBIN A1C: Hgb A1c MFr Bld: 5.5 % (ref 4.6–6.5)

## 2021-11-25 LAB — VITAMIN B12: Vitamin B-12: 322 pg/mL (ref 211–911)

## 2021-11-25 NOTE — Progress Notes (Signed)
? ?Chief Complaint  ?Patient presents with  ? Annual Exam  ?  fasting  ? ? ?HPI: ?[name]  56 y.o. comes in today for Preventive Health Care visit  ?She is overdue for assessment for her insurance premium reduction. ? ?Bp  up and down but mostly in range ?She is significantly concerned about her weight has been difficult to get it off and is weighed more than she has had in her life her twin has been able to maintain a lower body weight. ?She is exercising trying to take care of of dietary changes. ?She noticed the biggest problems within weeks after she began the carbamazepine   Wonders if that is related.doesn't feel full and satiety issues no binge eating insurance may cover ssaxenda ?She still has the bipolar syndrome sleep is very difficult racing and since she has been on 5 mg of Ambien not very helpful for sleep.  Her depression appears to be seasonal mostly in the winter. ?She had a GI work-up for anemia that included a colon and Endo.  No diagnoses of significance ?This is a chronic problem. ?On Humira for psoriasis ? ? ?Health Maintenance  ?Topic Date Due  ? TETANUS/TDAP  05/27/2022 (Originally 09/08/2021)  ? Hepatitis C Screening  05/27/2022 (Originally 06/10/1982)  ? HIV Screening  05/27/2022 (Originally 06/11/1979)  ? Zoster Vaccines- Shingrix (2 of 2) 05/27/2022 (Originally 01/01/2021)  ? INFLUENZA VACCINE  03/11/2022  ? MAMMOGRAM  02/06/2023  ? PAP SMEAR-Modifier  04/24/2024  ? COLONOSCOPY (Pts 45-41yrs Insurance coverage will need to be confirmed)  07/09/2031  ? COVID-19 Vaccine  Completed  ? HPV VACCINES  Aged Out  ? ?Health Maintenance Review ?LIFESTYLE:  ?Exercise:   at least 4 days per week  ?Tobacco/ETS:n ?Alcohol: weekends ?Sugar beverages::  diet. coke  ?Sleep: always  a problem   on ambien  ?Drug use: no ?HH of   2  no pets  ?Work:[employer]  about 25 hours per week  ? ? ?ROS:  ?GEN/ HEENT: No fever, significant weight changes sweats headaches vision problems hearing  changes, ?CV/ PULM; No chest pain shortness of breath cough, syncope,edema  change in exercise tolerance. ?GI /GU: No adominal pain, vomiting, change in bowel habits. No blood in the stool. No significant GU symptoms. ?SKIN/HEME: ,no acute skin rashes suspicious lesions or bleeding. No lymphadenopathy, nodules, masses.  ?NEURO/ PSYCH:  No neurologic signs such as weakness numbness.  ?IMM/ Allergy: No unusual infections.  Allergy .   ?REST of 12 system review negative except as per HPI ? ? ?Past Medical History:  ?Diagnosis Date  ? ADD (attention deficit disorder)   ? poss  ? Depression   ? Mood disorder (HCC)   ? Psoriasis   ? Psoriatic arthritis (HCC) 10/2012  ? ? ?Past Surgical History:  ?Procedure Laterality Date  ? ABDOMINOPLASTY  2002  ? BUNIONECTOMY    ? bone spur left foot  ? ENDOMETRIAL ABLATION  2015  ? UPPER GI ENDOSCOPY  06/16/2018  ? see report  ? ? ?Family History  ?Problem Relation Age of Onset  ? Other Mother   ? Myasthenia gravis Mother   ? Diabetes Father   ? Glaucoma Father   ? Psoriasis Sister   ? Depression Sister   ? Other Brother   ?     optic disease NMO neuromyelitis optica  ? Hashimoto's thyroiditis Daughter   ? Anxiety disorder Other   ? Arthritis Other   ?  parents   ? ? ?Social History  ? ?Socioeconomic History  ? Marital status: Married  ?  Spouse name: Not on file  ? Number of children: Not on file  ? Years of education: Not on file  ? Highest education level: Not on file  ?Occupational History  ? Not on file  ?Tobacco Use  ? Smoking status: Never  ? Smokeless tobacco: Never  ?Vaping Use  ? Vaping Use: Never used  ?Substance and Sexual Activity  ? Alcohol use: Yes  ?  Comment: Socially   ? Drug use: No  ? Sexual activity: Yes  ?Other Topics Concern  ? Not on file  ?Social History Narrative  ?  Married  ? No caffeine  ? Managing HH   Of 4   ? Pet dog  ? Regular exercise- yes  Not much in winter   ? BA degree  ?   ? ?Social Determinants of Health  ? ?Financial Resource Strain: Not on  file  ?Food Insecurity: Not on file  ?Transportation Needs: Not on file  ?Physical Activity: Not on file  ?Stress: Not on file  ?Social Connections: Not on file  ? ? ?Outpatient Medications Prior to Visit  ?Medication Sig Dispense Refill  ? carbamazepine (CARBATROL) 200 MG 12 hr capsule TAKE 1 CAPSULE EVERY MORNING AND TAKE 2 CAPSULES AT BEDTIME    ? HUMIRA PEN 40 MG/0.4ML PNKT SMARTSIG:40 Milligram(s) SUB-Q Every 2 Weeks    ? lamoTRIgine (LAMICTAL) 200 MG tablet Take 2 tablets by mouth daily.  4  ? losartan (COZAAR) 100 MG tablet TAKE 1 TABLET BY MOUTH EVERY DAY 90 tablet 0  ? traZODone (DESYREL) 50 MG tablet Take 50-150 mg by mouth at bedtime as needed for sleep.   3  ? trimethoprim-polymyxin b (POLYTRIM) ophthalmic solution Place 1 drop into both eyes every 6 (six) hours. 10 mL 0  ? zolpidem (AMBIEN) 5 MG tablet Take 1 tablet (5 mg total) by mouth at bedtime as needed for sleep. 15 tablet 0  ? estrogen-methylTESTOSTERone 0.625-1.25 MG tablet Take 1 tablet by mouth daily. (Patient not taking: Reported on 11/25/2021) 30 tablet 3  ? meloxicam (MOBIC) 15 MG tablet Take 1 tablet (15 mg total) by mouth daily. (Patient not taking: Reported on 11/25/2021) 21 tablet 0  ? progesterone (PROMETRIUM) 200 MG capsule Take 1 capsule (200 mg total) by mouth daily. (Patient not taking: Reported on 11/25/2021) 30 capsule 2  ? ?No facility-administered medications prior to visit.  ? ? ? ?EXAM: ? ?BP 126/84 (BP Location: Left Arm, Patient Position: Sitting, Cuff Size: Normal)   Pulse 81   Temp 98.6 ?F (37 ?C) (Oral)   Ht 5' 6.5" (1.689 m)   Wt 195 lb 9.6 oz (88.7 kg)   SpO2 95%   BMI 31.10 kg/m?  ? ?Body mass index is 31.1 kg/m?. ?Wt Readings from Last 3 Encounters:  ?11/25/21 195 lb 9.6 oz (88.7 kg)  ?07/15/21 194 lb 6.4 oz (88.2 kg)  ?04/29/21 189 lb (85.7 kg)  ? ? ?Physical Exam: ?Vital signs reviewed ?[id number] is a well-developed well-nourished alert cooperative    who appearsr stated age in no acute distress.  ?HEENT:  normocephalic atraumatic , Eyes: PERRL EOM's full, conjunctiva clear, Nares: paten,t no deformity discharge or tenderness., Ears: no deformity EAC's clear TMs with normal landmarks. NECK: supple without masses, thyromegaly or bruits. ?CHEST/PULM:  Clear to auscultation and percussion breath sounds equal no wheeze , rales or rhonchi. No chest wall deformities or tenderness. ?  Breast: normal by inspection . No dimpling, discharge, masses, tenderness or discharge . ?CV: PMI is nondisplaced, S1 S2 no gallops, murmurs, rubs. Peripheral pulses are full without delay.No JVD .  ?ABDOMEN: Bowel sounds normal nontender  No guard or rebound, no hepato splenomegal no CVA tenderness.   ?Extremtities:  No clubbing cyanosis or edema, no acute joint swelling or redness no focal atrophy ?NEURO:  Oriented x3, cranial nerves 3-12 appear to be intact, no obvious focal weakness,gait within normal limits no abnormal reflexes or asymmetrical ?SKIN: No acute rashes normal turgor, color, no bruising or petechiae. ?PSYCH: Oriented, good eye contact, no obvious depression anxiety, cognition and judgment appear normal. ?LN: no cervical axillary inguinal adenopathy ? ?Lab Results  ?Component Value Date  ? WBC 3.3 08/19/2021  ? HGB 9.5 (A) 08/19/2021  ? HCT 30 (A) 08/19/2021  ? PLT 250 08/19/2021  ? GLUCOSE 86 11/06/2020  ? CHOL 246 (H) 11/06/2020  ? TRIG 56.0 11/06/2020  ? HDL 116.10 11/06/2020  ? LDLDIRECT 51.0 09/16/2012  ? LDLCALC 119 (H) 11/06/2020  ? ALT 19 08/19/2021  ? AST 22 08/19/2021  ? NA 130 (A) 08/19/2021  ? K 4.2 08/19/2021  ? CL 96 (A) 08/19/2021  ? CREATININE 0.6 08/19/2021  ? BUN 9 08/19/2021  ? CO2 27 11/06/2020  ? TSH 0.52 11/06/2020  ? HGBA1C 5.3 01/24/2019  ? ? ?BP Readings from Last 3 Encounters:  ?11/25/21 126/84  ?07/15/21 (!) 153/84  ?04/29/21 (!) 162/90  ? ? ?Lab results reviewed ?Lab Results  ?Component Value Date  ? [id number] 10/31/2016  ? ? ?ASSESSMENT AND PLAN: ? ?Discussed the following assessment and  plan: ? ?  ICD-10-CM   ?1. Visit for preventive health examination  123456 Basic metabolic panel  ?  CBC with Differential/Platelet  ?  Hemoglobin A1c  ?  Lipid panel  ?  TSH  ?  T4, free  ?  Iron, TIBC and Ferritin Panel  ?  Vitam

## 2021-11-25 NOTE — Patient Instructions (Addendum)
Good to see you today . ?Lab pending and will complete form with this.  ? ?Continue lifestyle intervention healthy eating and exercise .  ? ?Weight loss meds  not a panacea and not always helpful.But can consider  ?Ozempic type meds   can decrease   satiety but not actually a suppressant .  ?Good options if pre diabetic.  ? ? ? ?

## 2021-11-26 LAB — IRON,TIBC AND FERRITIN PANEL
%SAT: 7 % (calc) — ABNORMAL LOW (ref 16–45)
Ferritin: 10 ng/mL — ABNORMAL LOW (ref 16–232)
Iron: 33 ug/dL — ABNORMAL LOW (ref 45–160)
TIBC: 450 mcg/dL (calc) (ref 250–450)

## 2021-11-27 ENCOUNTER — Telehealth: Payer: Self-pay | Admitting: Internal Medicine

## 2021-12-01 NOTE — Progress Notes (Signed)
Thyroid  shows subclinical hyperthyroid again .   Anemia  iron deficiency improved . ?Will discuss at FU visit . ?Make sure not taking biotin so we can recheck the thyroid tests.

## 2021-12-04 ENCOUNTER — Encounter: Payer: Self-pay | Admitting: Internal Medicine

## 2021-12-04 ENCOUNTER — Telehealth (INDEPENDENT_AMBULATORY_CARE_PROVIDER_SITE_OTHER): Payer: BC Managed Care – PPO | Admitting: Internal Medicine

## 2021-12-04 DIAGNOSIS — E611 Iron deficiency: Secondary | ICD-10-CM | POA: Diagnosis not present

## 2021-12-04 DIAGNOSIS — E0590 Thyrotoxicosis, unspecified without thyrotoxic crisis or storm: Secondary | ICD-10-CM

## 2021-12-04 DIAGNOSIS — E669 Obesity, unspecified: Secondary | ICD-10-CM

## 2021-12-04 DIAGNOSIS — Z6831 Body mass index (BMI) 31.0-31.9, adult: Secondary | ICD-10-CM

## 2021-12-04 DIAGNOSIS — Z79899 Other long term (current) drug therapy: Secondary | ICD-10-CM

## 2021-12-04 DIAGNOSIS — D649 Anemia, unspecified: Secondary | ICD-10-CM

## 2021-12-04 DIAGNOSIS — E871 Hypo-osmolality and hyponatremia: Secondary | ICD-10-CM

## 2021-12-04 MED ORDER — SEMAGLUTIDE-WEIGHT MANAGEMENT 0.25 MG/0.5ML ~~LOC~~ SOAJ: 0.2500 mg | SUBCUTANEOUS | 1 refills | Status: DC

## 2021-12-04 NOTE — Progress Notes (Signed)
?Virtual Visit via Video Note ? ?I connected with Melissa Walters on 12/04/21 at  9:30 AM EDT by a video enabled telemedicine application and verified that I am speaking with the correct person using two identifiers. ?Location patient:work  ?Location provider:work office ?Persons participating in the virtual visit: patient, provider ? ?[place] national recommendations  regarding COVID 19 pandemic   video visit is advised over in office visit for this patient.  ?Patient aware  of the limitations of evaluation and management by telemedicine and  availability of in person appointments. and agreed to proceed. ? ? ?HPI: ?Melissa Walters presents for video visit for lab results and follow-up of obesity weight management. ?No change in health ?Thyroid remote history of seeing endocrinologist for subclinical hyperthyroid but not felt to need intervention at the time does not remember having a scan done. ?Anemia has been having a problem for a while iron causes GI side effects so hard to take.  Has never been checked for celiac as far she knows. ? ?In regard to weight she continues to exercise regularly has in-home ability to exercise and other does have problem with appetite control.  :Hard to suppress medications are aggravators.  Has done weight management and path with some help but not continued Opdivo and weight watchers in the past although recently off.  Some of them work for a while but is hard to maintain. ? ? ?ROS: See pertinent positives and negatives per HPI. ? ?Past Medical History:  ?Diagnosis Date  ? ADD (attention deficit disorder)   ? poss  ? Depression   ? Mood disorder (HCC)   ? Psoriasis   ? Psoriatic arthritis (HCC) 10/2012  ? ? ?Past Surgical History:  ?Procedure Laterality Date  ? ABDOMINOPLASTY  2002  ? BUNIONECTOMY    ? bone spur left foot  ? ENDOMETRIAL ABLATION  2015  ? UPPER GI ENDOSCOPY  06/16/2018  ? see report  ? ? ?Family History  ?Problem Relation Age of Onset  ? Other Mother   ? Myasthenia  gravis Mother   ? Diabetes Father   ? Glaucoma Father   ? Psoriasis Sister   ? Depression Sister   ? Other Brother   ?     optic disease NMO neuromyelitis optica  ? Hashimoto's thyroiditis Daughter   ? Anxiety disorder Other   ? Arthritis Other   ?     parents   ? ? ?Social History  ? ?Tobacco Use  ? Smoking status: Never  ? Smokeless tobacco: Never  ?Vaping Use  ? Vaping Use: Never used  ?Substance Use Topics  ? Alcohol use: Yes  ?  Comment: Socially   ? Drug use: No  ? ? ? ? ?Current Outpatient Medications:  ?  carbamazepine (CARBATROL) 200 MG 12 hr capsule, TAKE 1 CAPSULE EVERY MORNING AND TAKE 2 CAPSULES AT BEDTIME, Disp: , Rfl:  ?  HUMIRA PEN 40 MG/0.4ML PNKT, SMARTSIG:40 Milligram(s) SUB-Q Every 2 Weeks, Disp: , Rfl:  ?  lamoTRIgine (LAMICTAL) 200 MG tablet, Take 2 tablets by mouth daily., Disp: , Rfl: 4 ?  losartan (COZAAR) 100 MG tablet, TAKE 1 TABLET BY MOUTH EVERY DAY, Disp: 90 tablet, Rfl: 0 ?  traZODone (DESYREL) 50 MG tablet, Take 50-150 mg by mouth at bedtime as needed for sleep. , Disp: , Rfl: 3 ?  trimethoprim-polymyxin b (POLYTRIM) ophthalmic solution, Place 1 drop into both eyes every 6 (six) hours., Disp: 10 mL, Rfl: 0 ?  zolpidem (AMBIEN) 5 MG  tablet, Take 1 tablet (5 mg total) by mouth at bedtime as needed for sleep., Disp: 15 tablet, Rfl: 0 ?  Semaglutide-Weight Management 0.25 MG/0.5ML SOAJ, Inject 0.25 mg into the skin once a week., Disp: 2 mL, Rfl: 1 ? ?EXAM: ?BP Readings from Last 3 Encounters:  ?11/25/21 126/84  ?07/15/21 (!) 153/84  ?04/29/21 (!) 162/90  ? ? ?VITALS per patient if applicable: ? ?GENERAL: alert, oriented, appears well and in no acute distress ? ?HEENT: atraumatic, conjunttiva clear, no obvious abnormalities on inspection of external nose and ears ? ?NECK: normal movements of the head and neck ? ?LUNGS: on inspection no signs of respiratory distress, breathing rate appears normal, no obvious gross SOB, gasping or wheezing ? ?CV: no obvious cyanosis ? ?MS: moves all visible  extremities without noticeable abnormality ? ?PSYCH/NEURO: pleasant and cooperative, no obvious depression or anxiety, speech and thought processing grossly intact ?Lab Results  ?Component Value Date  ? WBC 4.2 11/25/2021  ? HGB 10.2 (L) 11/25/2021  ? HCT 30.4 (L) 11/25/2021  ? PLT 294.0 11/25/2021  ? GLUCOSE 83 11/25/2021  ? CHOL 252 (H) 11/25/2021  ? TRIG 55.0 11/25/2021  ? HDL 135.80 11/25/2021  ? LDLDIRECT 51.0 09/16/2012  ? LDLCALC 105 (H) 11/25/2021  ? ALT 19 08/19/2021  ? AST 22 08/19/2021  ? NA 128 (L) 11/25/2021  ? K 4.6 11/25/2021  ? CL 94 (L) 11/25/2021  ? CREATININE 0.68 11/25/2021  ? BUN 12 11/25/2021  ? CO2 25 11/25/2021  ? TSH 0.34 (L) 11/25/2021  ? HGBA1C 5.5 11/25/2021  ? ?Lab Results  ?Component Value Date  ? IRON 33 (L) 11/25/2021  ? TIBC 450 11/25/2021  ? FERRITIN 10 (L) 11/25/2021  ? ?Lab Results  ?Component Value Date  ? [id number] 322 11/25/2021  ? ? ? ?ASSESSMENT AND PLAN: ? ?Discussed the following assessment and plan: ? ?  ICD-10-CM   ?1. Class 1 obesity with serious comorbidity and body mass index (BMI) of 31.0 to 31.9 in adult, unspecified obesity type  E66.9 Semaglutide-Weight Management 0.25 MG/0.5ML SOAJ  ? Z68.31   ?  ?2. Anemia, unspecified type  D64.9 Basic metabolic panel  ?  T4, free  ?  TSH  ?  T3, free  ?  Celiac Disease Comprehensive Panel with Reflexes  ?  ?3. Iron deficiency  E61.1 Basic metabolic panel  ?  T4, free  ?  TSH  ?  T3, free  ?  Celiac Disease Comprehensive Panel with Reflexes  ?  ?4. Subclinical hyperthyroidism  E05.90 Basic metabolic panel  ?  T4, free  ?  TSH  ?  T3, free  ?  Celiac Disease Comprehensive Panel with Reflexes  ? nl uptake exam in 2019 dr G   ?  ?5. Hyponatremia  E87.1 Basic metabolic panel  ?  T4, free  ?  TSH  ?  T3, free  ?  Celiac Disease Comprehensive Panel with Reflexes  ?  ?6. Medication management  Z79.899 Basic metabolic panel  ?  T4, free  ?  TSH  ?  T3, free  ?  Celiac Disease Comprehensive Panel with Reflexes  ?  Semaglutide-Weight  Management 0.25 MG/0.5ML SOAJ  ?  ? ? ? ?Uncertain about significant side effects if using appetite suppressant such as phentermine with her history of bipolar and other medications. ?Reasonable to use Wegovy because she has tried other methods is actively exercising and will go back on the weight watchers tracking program interventions ?Thyroid will need follow-up  although not sure it is related to weight she has not been taking biotin.  We may need endocrine consult again. ?Future labs should include a celiac panel.  Is a seen none listed in the record. ?Wegovy sent into her pharmacy with a notation that she has tried and is on program and additionally has medications that tend to weight gain. ?She states that low sodium has been off and on for a while and in the past the medicine was changed because of that but has no symptoms of such.  We will follow she is not on a diuretic. ?Counseled.  ? Expectant management and discussion of plan and treatment with opportunity to ask questions and all were answered. The patient agreed with the plan and demonstrated an understanding of the instructions. ?  ?Advised to call back or seek an in-person evaluation if worsening  or having  further concerns  in interim. ?Return for future labs   touch base inabout  1 month visit or other  on medication . ? ? ? ?Berniece Andreas, MD  ?

## 2021-12-04 NOTE — Addendum Note (Signed)
Addended byMadelin Headings on: 12/04/2021 09:58 AM ? ? Modules accepted: Orders ? ?

## 2021-12-05 ENCOUNTER — Telehealth: Payer: Self-pay | Admitting: Internal Medicine

## 2021-12-05 MED ORDER — LOSARTAN POTASSIUM 100 MG PO TABS: 100.0000 mg | ORAL_TABLET | Freq: Every day | ORAL | 0 refills | Status: DC

## 2021-12-05 NOTE — Telephone Encounter (Signed)
Rx sent to the pharmacy.

## 2021-12-05 NOTE — Telephone Encounter (Signed)
Pt requesting refill losartan (COZAAR) 100 MG tablet    ?[facility] #[id number] [place], [place] - [address] AT [facility]: [address] OF [facility] [address] & [facility] [address] Phone:  [phone]  ?Fax:  [phone]  ?  ? ?

## 2021-12-09 ENCOUNTER — Encounter: Payer: Self-pay | Admitting: Internal Medicine

## 2021-12-09 NOTE — Telephone Encounter (Signed)
Pt advised that a PA is needed in order for her to use her savings card. Pt verbalized understanding. Pt wants to make sure that when the new Rx is written after the denial, pt wants it to go to:  ? ?[facility] #[id number] - [place], [place] - [address] AT [facility] OF [address] ?

## 2021-12-10 ENCOUNTER — Ambulatory Visit: Payer: BC Managed Care – PPO | Admitting: Internal Medicine

## 2021-12-11 ENCOUNTER — Other Ambulatory Visit (INDEPENDENT_AMBULATORY_CARE_PROVIDER_SITE_OTHER): Payer: BC Managed Care – PPO

## 2021-12-11 DIAGNOSIS — E611 Iron deficiency: Secondary | ICD-10-CM | POA: Diagnosis not present

## 2021-12-11 DIAGNOSIS — Z79899 Other long term (current) drug therapy: Secondary | ICD-10-CM | POA: Diagnosis not present

## 2021-12-11 DIAGNOSIS — E871 Hypo-osmolality and hyponatremia: Secondary | ICD-10-CM | POA: Diagnosis not present

## 2021-12-11 DIAGNOSIS — D649 Anemia, unspecified: Secondary | ICD-10-CM | POA: Diagnosis not present

## 2021-12-11 DIAGNOSIS — F3181 Bipolar II disorder: Secondary | ICD-10-CM | POA: Diagnosis not present

## 2021-12-11 DIAGNOSIS — E0590 Thyrotoxicosis, unspecified without thyrotoxic crisis or storm: Secondary | ICD-10-CM

## 2021-12-11 LAB — BASIC METABOLIC PANEL
BUN: 8 mg/dL (ref 6–23)
CO2: 24 mEq/L (ref 19–32)
Calcium: 8.7 mg/dL (ref 8.4–10.5)
Chloride: 95 mEq/L — ABNORMAL LOW (ref 96–112)
Creatinine, Ser: 0.6 mg/dL (ref 0.40–1.20)
GFR: 99.58 mL/min (ref >60.00)
Glucose, Bld: 96 mg/dL (ref 70–99)
Potassium: 4.1 mEq/L (ref 3.5–5.1)
Sodium: 125 mEq/L — ABNORMAL LOW (ref 135–145)

## 2021-12-11 LAB — T3, FREE: T3, Free: 2.7 pg/mL (ref 2.3–4.2)

## 2021-12-11 LAB — TSH: TSH: 0.4 u[IU]/mL (ref 0.35–5.50)

## 2021-12-11 LAB — T4, FREE: Free T4: 0.95 ng/dL (ref 0.60–1.60)

## 2021-12-15 NOTE — Progress Notes (Signed)
Thyroid is better   but sodium level is even lower .    Need   more workup .   ?Please  order serum osmolarity ,  bmp and URINE sodium, urine osmolarity and Urine creatinine. ( Dont have to fast)  ?Dx hyponatremia  ?We may need you to see  endocrine or nephrology if  persistent at this level ?

## 2021-12-16 ENCOUNTER — Other Ambulatory Visit: Payer: Self-pay

## 2021-12-16 DIAGNOSIS — E611 Iron deficiency: Secondary | ICD-10-CM

## 2021-12-16 DIAGNOSIS — F3181 Bipolar II disorder: Secondary | ICD-10-CM | POA: Diagnosis not present

## 2021-12-16 DIAGNOSIS — E871 Hypo-osmolality and hyponatremia: Secondary | ICD-10-CM

## 2021-12-16 LAB — THYROID STIMULATING IMMUNOGLOBULIN: TSI: <89 % baseline (ref <140)

## 2021-12-16 LAB — CELIAC DISEASE COMPREHENSIVE PANEL WITH REFLEXES
(tTG) Ab, IgA: <1 U/mL
Immunoglobulin A: 215 mg/dL (ref 47–310)

## 2021-12-16 LAB — THYROID PEROXIDASE ANTIBODY: Thyroperoxidase Ab SerPl-aCnc: <1 IU/mL (ref <9)

## 2021-12-17 NOTE — Progress Notes (Signed)
Labs ordered.

## 2021-12-25 DIAGNOSIS — F3181 Bipolar II disorder: Secondary | ICD-10-CM | POA: Diagnosis not present

## 2021-12-30 ENCOUNTER — Telehealth: Payer: Self-pay | Admitting: Internal Medicine

## 2021-12-30 NOTE — Telephone Encounter (Signed)
Pt is calling and would like to increase wegovy to next mg. Pt is not having any side effects  [facility] #[id number] [place], [place] - [address] AT [facility] OF [address] & [facility] [address] Phone:  [phone]  Fax:  [phone]

## 2021-12-31 NOTE — Telephone Encounter (Signed)
Last Vv 12/04/21 PA was denied b/c  This request cannot be processed due to the medication is not covered by the plan. Please advise

## 2022-01-01 NOTE — Telephone Encounter (Signed)
Tell patient about denial  and she can call  her insurance and pharmacy to see what can be done to get approved   not sure why denied

## 2022-01-01 NOTE — Telephone Encounter (Signed)
Follow up message sent to PCP

## 2022-01-02 NOTE — Telephone Encounter (Signed)
Pt is aware md out of the office until Tuesday 01-07-2022

## 2022-01-07 ENCOUNTER — Other Ambulatory Visit: Payer: Self-pay

## 2022-01-07 DIAGNOSIS — Z79899 Other long term (current) drug therapy: Secondary | ICD-10-CM

## 2022-01-07 DIAGNOSIS — E669 Obesity, unspecified: Secondary | ICD-10-CM

## 2022-01-07 MED ORDER — SEMAGLUTIDE-WEIGHT MANAGEMENT 0.25 MG/0.5ML ~~LOC~~ SOAJ: 0.2500 mg | SUBCUTANEOUS | 1 refills | Status: DC

## 2022-01-08 ENCOUNTER — Other Ambulatory Visit: Payer: Self-pay | Admitting: Internal Medicine

## 2022-01-08 NOTE — Telephone Encounter (Signed)
Rx sent to the pharmacy 01/07/22

## 2022-01-08 NOTE — Telephone Encounter (Signed)
Rx was sent to pharmacy. 

## 2022-01-09 ENCOUNTER — Other Ambulatory Visit: Payer: Self-pay

## 2022-01-09 ENCOUNTER — Telehealth: Payer: Self-pay | Admitting: Internal Medicine

## 2022-01-09 DIAGNOSIS — Z79899 Other long term (current) drug therapy: Secondary | ICD-10-CM

## 2022-01-09 DIAGNOSIS — E669 Obesity, unspecified: Secondary | ICD-10-CM

## 2022-01-09 MED ORDER — SEMAGLUTIDE(0.25 OR 0.5MG/DOS) 2 MG/3ML ~~LOC~~ SOPN
1.0000 [IU] | PEN_INJECTOR | SUBCUTANEOUS | 1 refills | Status: DC
Start: 2022-01-09 — End: 2022-01-13

## 2022-01-09 NOTE — Telephone Encounter (Signed)
Pt requesting this prescription be written for 1.0mg , not the .25 that was sent to the pharmacy. Semaglutide-Weight Management 0.25 MG/0.5ML [facility] #[id number] [place], [place] - [address] AT [facility] OF [address] & [facility] [address] Phone:  [phone]  Fax:  [phone]

## 2022-01-10 ENCOUNTER — Encounter: Payer: Self-pay | Admitting: Internal Medicine

## 2022-01-10 MED ORDER — LOSARTAN POTASSIUM 100 MG PO TABS: 100.0000 mg | ORAL_TABLET | Freq: Every day | ORAL | 0 refills | Status: DC

## 2022-01-13 ENCOUNTER — Other Ambulatory Visit: Payer: BC Managed Care – PPO

## 2022-01-13 MED ORDER — WEGOVY 1 MG/0.5ML ~~LOC~~ SOAJ: 1.0000 mg | SUBCUTANEOUS | 1 refills | Status: DC

## 2022-01-13 NOTE — Telephone Encounter (Signed)
Sounds good

## 2022-01-13 NOTE — Telephone Encounter (Signed)
Rx sent in for 1 mg. 

## 2022-01-21 ENCOUNTER — Other Ambulatory Visit: Payer: Self-pay | Admitting: Internal Medicine

## 2022-01-21 NOTE — Telephone Encounter (Signed)
Judeth Cornfield from [employer] on [address] called to clarify whether pt is supposed to be taking Semaglutide-Weight Management (WEGOVY) 1 MG/0.5ML SOAJ or Ozempic...  [facility] #[id number] [place], [place] - [address] AT [facility] OF [address] & [facility] [address] Phone:  [phone]  Fax:  [phone]

## 2022-01-22 NOTE — Telephone Encounter (Signed)
Do not know how to answer this message .  Prescriptions have been written for [facility] weight management the dosage needs to be a clarified  see last message from patient that was sent to Dr. Clent Ridges in my absence Please clarify if she is  taking 0.5 or 1 mg this month

## 2022-01-23 NOTE — Telephone Encounter (Signed)
Spoke to patient to verify. Patient states she was on 0.25mg  WEGOVY last month initially. Currently ran out. Suppose to be on the increasing dose. Confirm that the increasing dose of 1mg  is on back order. Mention the  0.25mg  dose also on back order.   FYI

## 2022-01-24 NOTE — Telephone Encounter (Signed)
error 

## 2022-01-27 NOTE — Telephone Encounter (Signed)
So ok to order for her  0.5 mg per week  for one month of wegovy in whatever  options available to continue medication .    Prepare new order for me to sign if needed .

## 2022-01-28 NOTE — Telephone Encounter (Signed)
Spoke to [employer]( [facility]). He states they only have 2.4mg . Okay to send?  Medication order and pend.

## 2022-01-30 NOTE — Telephone Encounter (Signed)
Noted  

## 2022-02-25 DIAGNOSIS — M2550 Pain in unspecified joint: Secondary | ICD-10-CM | POA: Diagnosis not present

## 2022-02-25 DIAGNOSIS — L4059 Other psoriatic arthropathy: Secondary | ICD-10-CM | POA: Diagnosis not present

## 2022-02-25 DIAGNOSIS — R5382 Chronic fatigue, unspecified: Secondary | ICD-10-CM | POA: Diagnosis not present

## 2022-02-25 DIAGNOSIS — L401 Generalized pustular psoriasis: Secondary | ICD-10-CM | POA: Diagnosis not present

## 2022-02-26 DIAGNOSIS — F3181 Bipolar II disorder: Secondary | ICD-10-CM | POA: Diagnosis not present

## 2022-03-17 ENCOUNTER — Other Ambulatory Visit: Payer: Self-pay

## 2022-03-24 ENCOUNTER — Other Ambulatory Visit: Payer: Self-pay | Admitting: Internal Medicine

## 2022-03-25 NOTE — Telephone Encounter (Signed)
Please clarify dosing and  make a fu .   Dosing says begin October  and this is a step up dose.  She needs a fu virtual   1-2 months after beginning  the med from June? and I dont see that on the schedule

## 2022-03-31 NOTE — Telephone Encounter (Signed)
Ok to try 1 mg weekly dosing  x 1 month but if getting  significant side effects needs to stop .  And change to every other week until tolerated   I cannot advise going from 0.5 to 2.4 mg per week at this time .  Concern about adverse side effects.  [name]

## 2022-04-01 ENCOUNTER — Encounter: Payer: Self-pay | Admitting: Internal Medicine

## 2022-04-01 ENCOUNTER — Telehealth (INDEPENDENT_AMBULATORY_CARE_PROVIDER_SITE_OTHER): Payer: BC Managed Care – PPO | Admitting: Internal Medicine

## 2022-04-01 VITALS — HR 68 | Ht 66.5 in | Wt 186.0 lb

## 2022-04-01 DIAGNOSIS — Z79899 Other long term (current) drug therapy: Secondary | ICD-10-CM

## 2022-04-01 DIAGNOSIS — L4050 Arthropathic psoriasis, unspecified: Secondary | ICD-10-CM

## 2022-04-01 DIAGNOSIS — E871 Hypo-osmolality and hyponatremia: Secondary | ICD-10-CM

## 2022-04-01 DIAGNOSIS — E669 Obesity, unspecified: Secondary | ICD-10-CM

## 2022-04-01 DIAGNOSIS — Z6831 Body mass index (BMI) 31.0-31.9, adult: Secondary | ICD-10-CM

## 2022-04-01 DIAGNOSIS — E611 Iron deficiency: Secondary | ICD-10-CM

## 2022-04-01 MED ORDER — TIRZEPATIDE 2.5 MG/0.5ML ~~LOC~~ SOAJ: 2.5000 mg | SUBCUTANEOUS | 0 refills | Status: DC

## 2022-04-01 NOTE — Addendum Note (Signed)
Addended byMadelin Headings on: 04/01/2022 05:18 PM   Modules accepted: Orders

## 2022-04-01 NOTE — Progress Notes (Signed)
Virtual Visit via Video Note  I connected with Melissa Walters on 04/01/22 at  3:00 PM EDT by a video enabled telemedicine application and verified that I am speaking with the correct person using two identifiers. Location patient: home Location provider:work office Persons participating in the virtual visit: patient, provider  [place] national recommendations  regarding COVID 19 pandemic   video visit is advised over in office visit for this patient.  Patient aware  of the limitations of evaluation and management by telemedicine and  availability of in person appointments. and agreed to proceed.   HPI: Melissa Walters presents for video visit follow-up of semaglutide for weight loss and prediabetic situation. She was able to take the starter dose 0.25 for a month but had to pay out-of-pocket 100s of dollars had no side effects.  The increased dose I will go V is not available she could not buy this and was asking if even an higher dose could be ordered. She is looking into other options.  Is willing to try [facility] or even the oral Rybelsus depending on the cost.  Her behavioral health medications have been changed to mitigate potential hyponatremia.  The changes occurred over the last 3 weeks or so.Melissa Walters  He is willing to follow-up with repeat labs.  No other major changes in health.  It is frustrating for her as she is working out a good bed and is only been able to lose about 8 pounds.  ROS: See pertinent positives and negatives per HPI.  Past Medical History:  Diagnosis Date   ADD (attention deficit disorder)    poss   Depression    Mood disorder (HCC)    Psoriasis    Psoriatic arthritis (HCC) 10/2012    Past Surgical History:  Procedure Laterality Date   ABDOMINOPLASTY  2002   BUNIONECTOMY     bone spur left foot   ENDOMETRIAL ABLATION  2015   UPPER GI ENDOSCOPY  06/16/2018   see report    Family History  Problem Relation Age of Onset   Other Mother    Myasthenia gravis  Mother    Diabetes Father    Glaucoma Father    Psoriasis Sister    Depression Sister    Other Brother        optic disease NMO neuromyelitis optica   Hashimoto's thyroiditis Daughter    Anxiety disorder Other    Arthritis Other        parents     Social History   Tobacco Use   Smoking status: Never   Smokeless tobacco: Never  Vaping Use   Vaping Use: Never used  Substance Use Topics   Alcohol use: Yes    Comment: Socially    Drug use: No      Current Outpatient Medications:    HUMIRA PEN 40 MG/0.4ML PNKT, SMARTSIG:40 Milligram(s) SUB-Q Every 2 Weeks, Disp: , Rfl:    lamoTRIgine (LAMICTAL) 200 MG tablet, Take 2 tablets by mouth daily., Disp: , Rfl: 4   losartan (COZAAR) 100 MG tablet, Take 1 tablet (100 mg total) by mouth daily., Disp: 90 tablet, Rfl: 0   NON FORMULARY, L-methylfolate 15mg ; QD for depression, Disp: , Rfl:    Omega 3 1000 MG CAPS, Take by mouth., Disp: , Rfl:    thiamine (VITAMIN B1) 100 MG tablet, Take 100 mg by mouth daily. QD for prevention of problems on alcohol, Disp: , Rfl:    tirzepatide (MOUNJARO) 2.5 MG/0.5ML Pen, Inject 2.5 mg into  the skin once a week., Disp: 2 mL, Rfl: 0   traZODone (DESYREL) 50 MG tablet, Take 50-150 mg by mouth at bedtime as needed for sleep. , Disp: , Rfl: 3   zolpidem (AMBIEN) 5 MG tablet, Take 1 tablet (5 mg total) by mouth at bedtime as needed for sleep., Disp: 15 tablet, Rfl: 0  EXAM: BP Readings from Last 3 Encounters:  11/25/21 126/84  07/15/21 (!) 153/84  04/29/21 (!) 162/90   Wt Readings from Last 3 Encounters:  04/01/22 186 lb (84.4 kg)  11/25/21 195 lb 9.6 oz (88.7 kg)  07/15/21 194 lb 6.4 oz (88.2 kg)     VITALS per patient if applicable:  GENERAL: alert, oriented, appears well and in no acute distress  HEENT: atraumatic, conjunttiva clear, no obvious abnormalities on inspection of external nose and ears  NECK: normal movements of the head and neck  LUNGS: on inspection no signs of respiratory  distress, breathing rate appears normal, no obvious gross SOB, gasping or wheezing  CV: no obvious cyanosis  MS: moves all visible extremities without noticeable abnormality  PSYCH/NEURO: pleasant and cooperative, no obvious depression or anxiety, speech and thought processing grossly intact Lab Results  Component Value Date   WBC 4.2 11/25/2021   HGB 10.2 (L) 11/25/2021   HCT 30.4 (L) 11/25/2021   PLT 294.0 11/25/2021   GLUCOSE 96 12/11/2021   CHOL 252 (H) 11/25/2021   TRIG 55.0 11/25/2021   HDL 135.80 11/25/2021   LDLDIRECT 51.0 09/16/2012   LDLCALC 105 (H) 11/25/2021   ALT 19 08/19/2021   AST 22 08/19/2021   NA 125 (L) 12/11/2021   K 4.1 12/11/2021   CL 95 (L) 12/11/2021   CREATININE 0.60 12/11/2021   BUN 8 12/11/2021   CO2 24 12/11/2021   TSH 0.40 12/11/2021   HGBA1C 5.5 11/25/2021   Lab Results  Component Value Date   FERRITIN 10 (L) 11/25/2021     ASSESSMENT AND PLAN:  Discussed the following assessment and plan:    ICD-10-CM   1. Class 1 obesity with serious comorbidity and body mass index (BMI) of 31.0 to 31.9 in adult, unspecified obesity type  E66.9 Basic metabolic panel   C12.75 Osmolality, urine    Osmolality    Sodium, urine, random    CANCELED: Osmolality    CANCELED: Sodium, urine, random    CANCELED: Osmolality, urine   was able to loose  8 # although goal is more    2. Medication management  Z79.899 Basic metabolic panel    Osmolality, urine    Osmolality    Sodium, urine, random    CANCELED: Osmolality    CANCELED: Sodium, urine, random    CANCELED: Osmolality, urine    3. Hyponatremia  E87.1 Basic metabolic panel    Osmolality, urine    Osmolality    Sodium, urine, random    CANCELED: Osmolality    CANCELED: Sodium, urine, random    CANCELED: Osmolality, urine    4. Psoriatic arthritis (HCC)  L40.50 Basic metabolic panel    Osmolality, urine    Osmolality    Sodium, urine, random    CANCELED: Osmolality    CANCELED: Sodium,  urine, random    CANCELED: Osmolality, urine    5. Iron deficiency  E61.1 Basic metabolic panel    Osmolality, urine    Osmolality    Sodium, urine, random    CBC with Differential/Platelet    CANCELED: Osmolality    CANCELED: Sodium, urine, random  CANCELED: Osmolality, urine     Has been able to lose some weight but is somewhat frustrated by the lack of availability of medication.  Discussed other options that can be used off label but uncertain cost we will print out a prescription for [facility] begin her dose 0.5 mg to try weekly.  She can shop for best price. She will get an appointment and get follow-up labs to check her sodium level and anemia check.  Since medications have been changed that could cause hyponatremia Let us know how you are doing after a month and then plan follow-up visits. Counseled.   Expectant management and discussion of plan and treatment with opportunity to ask questions and all were answered. The patient agreed with the plan and demonstrated an understanding of the instructions.   Advised to call back or seek an in-person evaluation if worsening  or having  further concerns  in interim. Return for depending on results and after a month on new med .    Berniece Andreas, MD

## 2022-04-17 DIAGNOSIS — F3181 Bipolar II disorder: Secondary | ICD-10-CM | POA: Diagnosis not present

## 2022-04-25 ENCOUNTER — Telehealth: Payer: Self-pay | Admitting: Internal Medicine

## 2022-04-25 NOTE — Telephone Encounter (Signed)
Pt called to inform MD that she started  [facility] last month and has had no side effects to date.  Pt would like a refill.  LVV:  04/01/2022 LOV:  11/25/2021 = CPE  Please advise.  Please send to: [facility] [address] [place], [place] - [address] [place]. AT [facility] [address] & [address] Phone:  [phone]  Fax:  [phone]

## 2022-04-28 NOTE — Telephone Encounter (Signed)
Scheduled a f/u office visit with Dr. Regis Bill tomorrow.

## 2022-04-29 ENCOUNTER — Encounter: Payer: Self-pay | Admitting: Internal Medicine

## 2022-04-29 ENCOUNTER — Ambulatory Visit (INDEPENDENT_AMBULATORY_CARE_PROVIDER_SITE_OTHER): Payer: BC Managed Care – PPO | Admitting: Internal Medicine

## 2022-04-29 VITALS — BP 140/92 | HR 81 | Temp 98.2°F | Wt 174.8 lb

## 2022-04-29 DIAGNOSIS — E871 Hypo-osmolality and hyponatremia: Secondary | ICD-10-CM | POA: Diagnosis not present

## 2022-04-29 DIAGNOSIS — Z79899 Other long term (current) drug therapy: Secondary | ICD-10-CM

## 2022-04-29 DIAGNOSIS — D649 Anemia, unspecified: Secondary | ICD-10-CM | POA: Diagnosis not present

## 2022-04-29 DIAGNOSIS — E669 Obesity, unspecified: Secondary | ICD-10-CM

## 2022-04-29 DIAGNOSIS — R030 Elevated blood-pressure reading, without diagnosis of hypertension: Secondary | ICD-10-CM

## 2022-04-29 DIAGNOSIS — Z6831 Body mass index (BMI) 31.0-31.9, adult: Secondary | ICD-10-CM

## 2022-04-29 LAB — BASIC METABOLIC PANEL
BUN: 9 mg/dL (ref 6–23)
CO2: 27 mEq/L (ref 19–32)
Calcium: 10.8 mg/dL — ABNORMAL HIGH (ref 8.4–10.5)
Chloride: 103 mEq/L (ref 96–112)
Creatinine, Ser: 0.7 mg/dL (ref 0.40–1.20)
GFR: 95.69 mL/min (ref >60.00)
Glucose, Bld: 81 mg/dL (ref 70–99)
Potassium: 4.2 mEq/L (ref 3.5–5.1)
Sodium: 139 mEq/L (ref 135–145)

## 2022-04-29 LAB — CBC WITH DIFFERENTIAL/PLATELET
Basophils Absolute: 0.1 10*3/uL (ref 0.0–0.1)
Basophils Relative: 1.7 % (ref 0.0–3.0)
Eosinophils Absolute: 0.1 10*3/uL (ref 0.0–0.7)
Eosinophils Relative: 2.1 % (ref 0.0–5.0)
HCT: 28.2 % — ABNORMAL LOW (ref 36.0–46.0)
Hemoglobin: 9.2 g/dL — ABNORMAL LOW (ref 12.0–15.0)
Lymphocytes Relative: 22.9 % (ref 12.0–46.0)
Lymphs Abs: 0.7 10*3/uL (ref 0.7–4.0)
MCHC: 32.6 g/dL (ref 30.0–36.0)
MCV: 78 fl (ref 78.0–100.0)
Monocytes Absolute: 0.3 10*3/uL (ref 0.1–1.0)
Monocytes Relative: 11.3 % (ref 3.0–12.0)
Neutro Abs: 1.9 10*3/uL (ref 1.4–7.7)
Neutrophils Relative %: 62 % (ref 43.0–77.0)
Platelets: 202 10*3/uL (ref 150.0–400.0)
RBC: 3.61 Mil/uL — ABNORMAL LOW (ref 3.87–5.11)
RDW: 18.6 % — ABNORMAL HIGH (ref 11.5–15.5)
WBC: 3 10*3/uL — ABNORMAL LOW (ref 4.0–10.5)

## 2022-04-29 MED ORDER — TIRZEPATIDE 2.5 MG/0.5ML ~~LOC~~ SOAJ
2.5000 mg | SUBCUTANEOUS | 0 refills | Status: DC
Start: 2022-04-29 — End: 2022-05-20

## 2022-04-29 NOTE — Patient Instructions (Addendum)
Wt Readings from Last 3 Encounters:  04/29/22 174 lb 12.8 oz (79.3 kg)  04/01/22 186 lb (84.4 kg)  11/25/21 195 lb 9.6 oz (88.7 kg)   Medication can decrease  gastric emptying so caution  small amount food at a time and long before bed  Can try adding pepcid at night twice a day ik .  Checking sodium level today.   Heart healthy diet  continue  to follow BP  incase we ned to add bp medication.

## 2022-04-29 NOTE — Progress Notes (Signed)
Chief Complaint  Patient presents with   Follow-up    Med check for mounjaro.     HPI: Melissa Walters 56 y.o. come in for fu  hyponatremia  (   carbamazepine)  and mounjarao  Tolerating so fare  paying out of [place] wwith coupon    acid reflux is worse but has been on  prilosec.  No vomiting dose some tem fasting and the early afternoon eating  Still exercising life style.  Would like a refill of Mounjaro and stay on it. She is on losartan for hypertension. ROS: See pertinent positives and negatives per HPI.  Past Medical History:  Diagnosis Date   ADD (attention deficit disorder)    poss   Depression    Mood disorder (HCC)    Psoriasis    Psoriatic arthritis ([place]) 10/2012    Family History  Problem Relation Age of Onset   Other Mother    Myasthenia gravis Mother    Diabetes Father    Glaucoma Father    Psoriasis Sister    Depression Sister    Other Brother        optic disease NMO neuromyelitis optica   Hashimoto's thyroiditis Daughter    Anxiety disorder Other    Arthritis Other        parents     Social History   Socioeconomic History   Marital status: Married    Spouse name: Not on file   Number of children: Not on file   Years of education: Not on file   Highest education level: Not on file  Occupational History   Not on file  Tobacco Use   Smoking status: Never   Smokeless tobacco: Never  Vaping Use   Vaping Use: Never used  Substance and Sexual Activity   Alcohol use: Yes    Comment: Socially    Drug use: No   Sexual activity: Yes  Other Topics Concern   Not on file  Social History Narrative    Married   No caffeine   Managing HH   Of 4    Pet dog   Regular exercise- yes  Not much in winter    BA degree      Social Determinants of Health   Financial Resource Strain: Not on file  Food Insecurity: Not on file  Transportation Needs: Not on file  Physical Activity: Not on file  Stress: Not on file  Social Connections: Not on file     Outpatient Medications Prior to Visit  Medication Sig Dispense Refill   HUMIRA PEN 40 MG/0.4ML PNKT SMARTSIG:40 Milligram(s) SUB-Q Every 2 Weeks     lamoTRIgine (LAMICTAL) 200 MG tablet Take 2 tablets by mouth daily.  4   losartan (COZAAR) 100 MG tablet Take 1 tablet (100 mg total) by mouth daily. 90 tablet 0   NON FORMULARY L-methylfolate 15mg ; QD for depression     Omega 3 1000 MG CAPS Take by mouth.     thiamine (VITAMIN B1) 100 MG tablet Take 100 mg by mouth daily. QD for prevention of problems on alcohol     traZODone (DESYREL) 50 MG tablet Take 50-150 mg by mouth at bedtime as needed for sleep.   3   zolpidem (AMBIEN) 5 MG tablet Take 1 tablet (5 mg total) by mouth at bedtime as needed for sleep. 15 tablet 0   tirzepatide (MOUNJARO) 2.5 MG/0.5ML Pen Inject 2.5 mg into the skin once a week. 2 mL 0   No  facility-administered medications prior to visit.     EXAM:  BP (!) 140/92 (BP Location: Right Arm, Cuff Size: Normal)   Pulse 81   Temp 98.2 F (36.8 C) (Oral)   Wt 174 lb 12.8 oz (79.3 kg)   SpO2 92%   BMI 27.79 kg/m   Body mass index is 27.79 kg/m. Wt Readings from Last 3 Encounters:  04/29/22 174 lb 12.8 oz (79.3 kg)  04/01/22 186 lb (84.4 kg)  11/25/21 195 lb 9.6 oz (88.7 kg)    GENERAL: vitals reviewed and listed above, alert, oriented, appears well hydrated and in no acute distress HEENT: atraumatic, conjunctiva  clear, no obvious abnormalities on inspection of external nose and ears NECK: no obvious masses on inspection palpation  LUNGS: clear to auscultation bilaterally, no wheezes, rales or rhonchi, good air movement CV: HRRR, no clubbing cyanosis or  peripheral edema nl cap refill  Abdomen soft without again megaly guarding or rebound MS: moves all extremities without noticeable focal  abnormality PSYCH: pleasant and cooperative, no obvious depression or anxiety Lab Results  Component Value Date   WBC 3.0 (L) 04/29/2022   HGB 9.2 (L) 04/29/2022   HCT  28.2 (L) 04/29/2022   PLT 202.0 04/29/2022   GLUCOSE 81 04/29/2022   CHOL 252 (H) 11/25/2021   TRIG 55.0 11/25/2021   HDL 135.80 11/25/2021   LDLDIRECT 51.0 09/16/2012   LDLCALC 105 (H) 11/25/2021   ALT 19 08/19/2021   AST 22 08/19/2021   NA 139 04/29/2022   K 4.2 04/29/2022   CL 103 04/29/2022   CREATININE 0.70 04/29/2022   BUN 9 04/29/2022   CO2 27 04/29/2022   TSH 0.40 12/11/2021   HGBA1C 5.5 11/25/2021   BP Readings from Last 3 Encounters:  04/29/22 (!) 140/92  11/25/21 126/84  07/15/21 (!) 153/84    ASSESSMENT AND PLAN:  Discussed the following assessment and plan:  Medication management - Plan: Basic metabolic panel, Sodium, urine, random, Osmolality, Osmolality, urine, CBC with Differential/Platelet, Sodium, urine, random, CBC with Differential/Platelet, Osmolality, urine, Osmolality, Basic metabolic panel  Hyponatremia - Plan: Basic metabolic panel, Sodium, urine, random, Osmolality, Osmolality, urine, CBC with Differential/Platelet, Sodium, urine, random, CBC with Differential/Platelet, Osmolality, urine, Osmolality, Basic metabolic panel  Anemia, unspecified type - Iron deficiency negative celiac panel now is off the PPI we will follow no bleeding reported.  On high risk medicine - Plan: Basic metabolic panel, Sodium, urine, random, Osmolality, Osmolality, urine, CBC with Differential/Platelet, Sodium, urine, random, CBC with Differential/Platelet, Osmolality, urine, Osmolality, Basic metabolic panel  Class 1 obesity with serious comorbidity and body mass index (BMI) of 31.0 to 31.9 in adult, unspecified obesity type  Elevated blood pressure reading She is pleased with some of her weight loss although it has been difficult.  Denies any significant side effect except may be increase in her heartburn. Discussed trying Pepcid twice daily since she is off the Prilosec small amounts of food at a time which is probably happening because of the side effects of medicine  No  other alarm symptoms. In regard to hyponatremia needs follow-up labs since been taking off medications needs a follow-up. Not need to increase sodium for that reason Monitor blood pressure and blood pressure control.  Record review monitoring 30 minutes Will review lab when available  -Patient advised to return or notify health care team  if  new concerns arise.  Patient Instructions   Wt Readings from Last 3 Encounters:  04/29/22 174 lb 12.8 oz (79.3 kg)  04/01/22  186 lb (84.4 kg)  11/25/21 195 lb 9.6 oz (88.7 kg)   Medication can decrease  gastric emptying so caution  small amount food at a time and long before bed  Can try adding pepcid at night twice a day ik .  Checking sodium level today.   Heart healthy diet  continue  to follow BP  incase we ned to add bp medication.    Standley Brooking. Hali Balgobin M.D.

## 2022-04-30 LAB — SODIUM, URINE, RANDOM: Sodium, Ur: 25 mmol/L — ABNORMAL LOW (ref 28–272)

## 2022-04-30 LAB — OSMOLALITY: Osmolality: 294 mOsm/kg (ref 278–305)

## 2022-04-30 LAB — OSMOLALITY, URINE: Osmolality, Ur: 181 mOsm/kg (ref 50–1200)

## 2022-05-05 NOTE — Progress Notes (Signed)
Sodium is still better but now your anemia is worse not sure why the medicine or rheumatologic condition  I will be out of the town for 2 weeks Make a video visit for 3 to 4 weeks from now and we will figure out what to do about the anemia and slightly elevated calcium.

## 2022-05-20 ENCOUNTER — Other Ambulatory Visit: Payer: Self-pay | Admitting: Internal Medicine

## 2022-05-23 MED ORDER — TIRZEPATIDE 2.5 MG/0.5ML ~~LOC~~ SOAJ: 2.5000 mg | SUBCUTANEOUS | 0 refills | Status: DC

## 2022-05-23 MED ORDER — LOSARTAN POTASSIUM 100 MG PO TABS: 100.0000 mg | ORAL_TABLET | Freq: Every day | ORAL | 0 refills | Status: DC

## 2022-05-28 ENCOUNTER — Other Ambulatory Visit: Payer: Self-pay

## 2022-05-28 ENCOUNTER — Other Ambulatory Visit: Payer: Self-pay | Admitting: Internal Medicine

## 2022-05-28 ENCOUNTER — Telehealth: Payer: Self-pay | Admitting: Internal Medicine

## 2022-05-28 DIAGNOSIS — F3181 Bipolar II disorder: Secondary | ICD-10-CM | POA: Diagnosis not present

## 2022-05-28 MED ORDER — TIRZEPATIDE 2.5 MG/0.5ML ~~LOC~~ SOAJ: 2.5000 mg | SUBCUTANEOUS | 0 refills | Status: DC

## 2022-05-28 MED ORDER — TIRZEPATIDE 2.5 MG/0.5ML ~~LOC~~ SOAJ: 2.5000 mg | SUBCUTANEOUS | 1 refills | Status: DC

## 2022-05-28 NOTE — Telephone Encounter (Signed)
Prescription order was printed and signed by Dr. Regis Bill. Patient picked it up. Patient verified the correct dosage of Mounjaro.

## 2022-05-28 NOTE — Telephone Encounter (Signed)
Pt checking on where the prescription for tirzepatide [facility]) 2.5 MG/0.5ML Pen is. Written 05/23/22

## 2022-05-31 ENCOUNTER — Other Ambulatory Visit: Payer: Self-pay | Admitting: Internal Medicine

## 2022-06-08 NOTE — Progress Notes (Deleted)
No chief complaint on file.   HPI: Melissa Walters 56 y.o. come in for  ROS: See pertinent positives and negatives per HPI.  Past Medical History:  Diagnosis Date   ADD (attention deficit disorder)    poss   Depression    Mood disorder (HCC)    Psoriasis    Psoriatic arthritis (HCC) 10/2012    Family History  Problem Relation Age of Onset   Other Mother    Myasthenia gravis Mother    Diabetes Father    Glaucoma Father    Psoriasis Sister    Depression Sister    Other Brother        optic disease NMO neuromyelitis optica   Hashimoto's thyroiditis Daughter    Anxiety disorder Other    Arthritis Other        parents     Social History   Socioeconomic History   Marital status: Married    Spouse name: Not on file   Number of children: Not on file   Years of education: Not on file   Highest education level: Not on file  Occupational History   Not on file  Tobacco Use   Smoking status: Never   Smokeless tobacco: Never  Vaping Use   Vaping Use: Never used  Substance and Sexual Activity   Alcohol use: Yes    Comment: Socially    Drug use: No   Sexual activity: Yes  Other Topics Concern   Not on file  Social History Narrative    Married   No caffeine   Managing HH   Of 4    Pet dog   Regular exercise- yes  Not much in winter    BA degree      Social Determinants of Health   Financial Resource Strain: Not on file  Food Insecurity: Not on file  Transportation Needs: Not on file  Physical Activity: Not on file  Stress: Not on file  Social Connections: Not on file    Outpatient Medications Prior to Visit  Medication Sig Dispense Refill   HUMIRA PEN 40 MG/0.4ML PNKT SMARTSIG:40 Milligram(s) SUB-Q Every 2 Weeks     lamoTRIgine (LAMICTAL) 200 MG tablet Take 2 tablets by mouth daily.  4   losartan (COZAAR) 100 MG tablet Take 1 tablet (100 mg total) by mouth daily. 90 tablet 0   NON FORMULARY L-methylfolate 15mg ; QD for depression     Omega 3 1000 MG  CAPS Take by mouth.     thiamine (VITAMIN B1) 100 MG tablet Take 100 mg by mouth daily. QD for prevention of problems on alcohol     tirzepatide (MOUNJARO) 2.5 MG/0.5ML Pen Inject 2.5 mg into the skin once a week. 2 mL 1   traZODone (DESYREL) 50 MG tablet Take 50-150 mg by mouth at bedtime as needed for sleep.   3   zolpidem (AMBIEN) 5 MG tablet Take 1 tablet (5 mg total) by mouth at bedtime as needed for sleep. 15 tablet 0   No facility-administered medications prior to visit.     EXAM:  There were no vitals taken for this visit.  There is no height or weight on file to calculate BMI.  GENERAL: vitals reviewed and listed above, alert, oriented, appears well hydrated and in no acute distress HEENT: atraumatic, conjunctiva  clear, no obvious abnormalities on inspection of external nose and ears OP : no lesion edema or exudate  NECK: no obvious masses on inspection palpation  LUNGS: clear  to auscultation bilaterally, no wheezes, rales or rhonchi, good air movement CV: HRRR, no clubbing cyanosis or  peripheral edema nl cap refill  MS: moves all extremities without noticeable focal  abnormality PSYCH: pleasant and cooperative, no obvious depression or anxiety Lab Results  Component Value Date   WBC 3.0 (L) 04/29/2022   HGB 9.2 (L) 04/29/2022   HCT 28.2 (L) 04/29/2022   PLT 202.0 04/29/2022   GLUCOSE 81 04/29/2022   CHOL 252 (H) 11/25/2021   TRIG 55.0 11/25/2021   HDL 135.80 11/25/2021   LDLDIRECT 51.0 09/16/2012   LDLCALC 105 (H) 11/25/2021   ALT 19 08/19/2021   AST 22 08/19/2021   NA 139 04/29/2022   K 4.2 04/29/2022   CL 103 04/29/2022   CREATININE 0.70 04/29/2022   BUN 9 04/29/2022   CO2 27 04/29/2022   TSH 0.40 12/11/2021   HGBA1C 5.5 11/25/2021   BP Readings from Last 3 Encounters:  04/29/22 (!) 140/92  11/25/21 126/84  07/15/21 (!) 153/84    ASSESSMENT AND PLAN:  Discussed the following assessment and plan:  Iron deficiency  Anemia, unspecified  type  Class 1 obesity with serious comorbidity and body mass index (BMI) of 31.0 to 31.9 in adult, unspecified obesity type  Medication management  -Patient advised to return or notify health care team  if  new concerns arise.  There are no Patient Instructions on file for this visit.   Standley Brooking. Garry Nicolini M.D.

## 2022-06-09 ENCOUNTER — Ambulatory Visit: Payer: BC Managed Care – PPO | Admitting: Internal Medicine

## 2022-06-09 DIAGNOSIS — E669 Obesity, unspecified: Secondary | ICD-10-CM

## 2022-06-09 DIAGNOSIS — D649 Anemia, unspecified: Secondary | ICD-10-CM

## 2022-06-09 DIAGNOSIS — Z79899 Other long term (current) drug therapy: Secondary | ICD-10-CM

## 2022-06-09 DIAGNOSIS — E611 Iron deficiency: Secondary | ICD-10-CM

## 2022-06-24 ENCOUNTER — Ambulatory Visit (INDEPENDENT_AMBULATORY_CARE_PROVIDER_SITE_OTHER): Payer: BC Managed Care – PPO | Admitting: Internal Medicine

## 2022-06-24 ENCOUNTER — Encounter: Payer: Self-pay | Admitting: Internal Medicine

## 2022-06-24 VITALS — BP 150/94 | HR 96 | Temp 97.8°F | Wt 166.4 lb

## 2022-06-24 DIAGNOSIS — Z23 Encounter for immunization: Secondary | ICD-10-CM | POA: Diagnosis not present

## 2022-06-24 DIAGNOSIS — D649 Anemia, unspecified: Secondary | ICD-10-CM

## 2022-06-24 DIAGNOSIS — Z79899 Other long term (current) drug therapy: Secondary | ICD-10-CM

## 2022-06-24 DIAGNOSIS — L4050 Arthropathic psoriasis, unspecified: Secondary | ICD-10-CM

## 2022-06-24 DIAGNOSIS — E669 Obesity, unspecified: Secondary | ICD-10-CM | POA: Diagnosis not present

## 2022-06-24 DIAGNOSIS — I10 Essential (primary) hypertension: Secondary | ICD-10-CM | POA: Diagnosis not present

## 2022-06-24 DIAGNOSIS — R030 Elevated blood-pressure reading, without diagnosis of hypertension: Secondary | ICD-10-CM | POA: Diagnosis not present

## 2022-06-24 DIAGNOSIS — E2839 Other primary ovarian failure: Secondary | ICD-10-CM

## 2022-06-24 DIAGNOSIS — Z6831 Body mass index (BMI) 31.0-31.9, adult: Secondary | ICD-10-CM

## 2022-06-24 MED ORDER — TIRZEPATIDE 2.5 MG/0.5ML ~~LOC~~ SOAJ: 2.5000 mg | SUBCUTANEOUS | 1 refills | Status: DC

## 2022-06-24 MED ORDER — AMLODIPINE BESYLATE 2.5 MG PO TABS: 2.5000 mg | ORAL_TABLET | Freq: Every day | ORAL | 1 refills | Status: DC

## 2022-06-24 NOTE — Patient Instructions (Addendum)
Good to see you today .  Continue lifestyle intervention healthy eating and exercise .   Refill mounjaro   Add amlodipine 2.5 to the losartan for BP control. Get a better monitor for BP.  Plan labs and fu  readings in  about 2 month  virtual ok   to check count chenistry  ( be hydrated for testing)

## 2022-06-24 NOTE — Addendum Note (Signed)
Addended byVickii Chafe on: 06/24/2022 04:55 PM   Modules accepted: Orders

## 2022-06-24 NOTE — Progress Notes (Signed)
Chief Complaint  Patient presents with   Follow-up    On blood work     HPI: Melissa Walters 56 y.o. come in for Chronic disease management  Weight management:Monounjaro   intermittent fasting   130  x 8 hour  for 6 mos and doing ok with this.  Feels better  physically and mentally  . Has more energy on Lamictal and psych stable Some red meat .  Healthier diet  BP   old machine reads high not sure ac curate  taking 100 losartan no se noted Has cough at times at night and hx of HH no swallowing issues. Takes omega 3 in am  Sees gyne  had ablation around age 41 ? If get a dexa? No vits x B1  ON humira  per rheum and gets labs ocass  seems every 3-4 months  Asks about immunizations   had only shingrix    1   ROS: See pertinent positives and negatives per HPI. No bleeding  new gi ms sx     Past Medical History:  Diagnosis Date   ADD (attention deficit disorder)    poss   Depression    Mood disorder (HCC)    Psoriasis    Psoriatic arthritis ([place]) 10/2012    Family History  Problem Relation Age of Onset   Other Mother    Myasthenia gravis Mother    Diabetes Father    Glaucoma Father    Psoriasis Sister    Depression Sister    Other Brother        optic disease NMO neuromyelitis optica   Hashimoto's thyroiditis Daughter    Anxiety disorder Other    Arthritis Other        parents     Social History   Socioeconomic History   Marital status: Married    Spouse name: Not on file   Number of children: Not on file   Years of education: Not on file   Highest education level: Not on file  Occupational History   Not on file  Tobacco Use   Smoking status: Never   Smokeless tobacco: Never  Vaping Use   Vaping Use: Never used  Substance and Sexual Activity   Alcohol use: Yes    Comment: Socially    Drug use: No   Sexual activity: Yes  Other Topics Concern   Not on file  Social History Narrative    Married   No caffeine   Managing HH   Of 4    Pet dog    Regular exercise- yes  Not much in winter    BA degree      Social Determinants of Health   Financial Resource Strain: Not on file  Food Insecurity: Not on file  Transportation Needs: Not on file  Physical Activity: Not on file  Stress: Not on file  Social Connections: Not on file    Outpatient Medications Prior to Visit  Medication Sig Dispense Refill   HUMIRA PEN 40 MG/0.4ML PNKT SMARTSIG:40 Milligram(s) SUB-Q Every 2 Weeks     lamoTRIgine (LAMICTAL) 200 MG tablet Take 2 tablets by mouth daily.  4   losartan (COZAAR) 100 MG tablet Take 1 tablet (100 mg total) by mouth daily. 90 tablet 0   NON FORMULARY L-methylfolate 15mg ; QD for depression     Omega 3 1000 MG CAPS Take by mouth.     thiamine (VITAMIN B1) 100 MG tablet Take 100 mg by mouth daily. QD  for prevention of problems on alcohol     traZODone (DESYREL) 50 MG tablet Take 50-150 mg by mouth at bedtime as needed for sleep.   3   zolpidem (AMBIEN) 5 MG tablet Take 1 tablet (5 mg total) by mouth at bedtime as needed for sleep. 15 tablet 0   tirzepatide (MOUNJARO) 2.5 MG/0.5ML Pen Inject 2.5 mg into the skin once a week. 2 mL 1   No facility-administered medications prior to visit.     EXAM:  BP (!) 150/94 (BP Location: Left Arm, Cuff Size: Normal)   Pulse 96   Temp 97.8 F (36.6 C) (Oral)   Wt 166 lb 6.4 oz (75.5 kg)   SpO2 96%   BMI 26.46 kg/m   Body mass index is 26.46 kg/m.  GENERAL: vitals reviewed and listed above, alert, oriented, appears well hydrated and in no acute distress HEENT: atraumatic, conjunctiva  clear, no obvious abnormalities on inspection of external nose and ears  NECK: no obvious masses on inspection palpation  LUNGS: clear to auscultation bilaterally, no wheezes, rales or rhonchi, good air movement CV: HRRR, no clubbing cyanosis or  peripheral edema nl cap refill  Abdomen:  Sof,t normal bowel sounds without hepatosplenomegaly, no guarding rebound or masses no CVA tenderness  MS: moves all  extremities without noticeable focal  abnormality PSYCH: pleasant and cooperative, no obvious depression or anxiety affect  more animated and well  Lab Results  Component Value Date   WBC 3.0 (L) 04/29/2022   HGB 9.2 (L) 04/29/2022   HCT 28.2 (L) 04/29/2022   PLT 202.0 04/29/2022   GLUCOSE 81 04/29/2022   CHOL 252 (H) 11/25/2021   TRIG 55.0 11/25/2021   HDL 135.80 11/25/2021   LDLDIRECT 51.0 09/16/2012   LDLCALC 105 (H) 11/25/2021   ALT 19 08/19/2021   AST 22 08/19/2021   NA 139 04/29/2022   K 4.2 04/29/2022   CL 103 04/29/2022   CREATININE 0.70 04/29/2022   BUN 9 04/29/2022   CO2 27 04/29/2022   TSH 0.40 12/11/2021   HGBA1C 5.5 11/25/2021   BP Readings from Last 3 Encounters:  06/24/22 (!) 150/94  04/29/22 (!) 140/92  11/25/21 126/84    ASSESSMENT AND PLAN:  Discussed the following assessment and plan:  Medication management - Plan: CBC with Differential/Platelet, Basic metabolic panel, Iron, TIBC and Ferritin Panel  Essential hypertension - fam hx  get monitor intensify rx - Plan: CBC with Differential/Platelet, Basic metabolic panel, Iron, TIBC and Ferritin Panel  Class 1 obesity with serious comorbidity and body mass index (BMI) of 31.0 to 31.9 in adult, unspecified obesity type - Plan: CBC with Differential/Platelet, Basic metabolic panel, Iron, TIBC and Ferritin Panel  Elevated blood pressure reading - Plan: CBC with Differential/Platelet, Basic metabolic panel, Iron, TIBC and Ferritin Panel  Psoriatic arthritis (HCC) - Plan: CBC with Differential/Platelet, Basic metabolic panel, Iron, TIBC and Ferritin Panel  Anemia, unspecified type - Plan: CBC with Differential/Platelet, Basic metabolic panel, Iron, TIBC and Ferritin Panel  Estrogen deficiency - Plan: DG Bone Density, CBC with Differential/Platelet, Basic metabolic panel, Iron, TIBC and Ferritin Panel  Hypercalcemia - Plan: CBC with Differential/Platelet, Basic metabolic panel, Iron, TIBC and Ferritin  Panel Overall doing better Continue Mounjaro same dose Intensify antihypertensive treatment stay on losartan and amlodipine 2.5 mg  has fam hx uncert effect Humira  Get home blood pressure monitor. Plan lab work in about 2 months and then follow-up visit virtual okay Elevated calcium level could be related to blood draw  will monitor.  But hyponatremia is resolved off of offending medication. Follow blood count anemia.  Poss effect humira  Dexa scan ordered at [facility]  -Patient advised to return or notify health care team  if  new concerns arise.  Patient Instructions  Good to see you today .  Continue lifestyle intervention healthy eating and exercise .   Refill mounjaro   Add amlodipine 2.5 to the losartan for BP control. Get a better monitor for BP.  Plan labs and fu  readings in  about 2 month  virtual ok   to check count chenistry  ( be hydrated for testing)    Standley Brooking. Hamsa Laurich M.D.

## 2022-08-25 ENCOUNTER — Other Ambulatory Visit: Payer: BC Managed Care – PPO

## 2022-08-27 DIAGNOSIS — L409 Psoriasis, unspecified: Secondary | ICD-10-CM | POA: Diagnosis not present

## 2022-08-27 DIAGNOSIS — L821 Other seborrheic keratosis: Secondary | ICD-10-CM | POA: Diagnosis not present

## 2022-08-27 DIAGNOSIS — L814 Other melanin hyperpigmentation: Secondary | ICD-10-CM | POA: Diagnosis not present

## 2022-08-27 DIAGNOSIS — Z85828 Personal history of other malignant neoplasm of skin: Secondary | ICD-10-CM | POA: Diagnosis not present

## 2022-08-31 ENCOUNTER — Other Ambulatory Visit: Payer: Self-pay | Admitting: Internal Medicine

## 2022-09-01 ENCOUNTER — Telehealth: Payer: BC Managed Care – PPO | Admitting: Internal Medicine

## 2022-09-03 ENCOUNTER — Other Ambulatory Visit: Payer: BC Managed Care – PPO

## 2022-09-10 ENCOUNTER — Other Ambulatory Visit (INDEPENDENT_AMBULATORY_CARE_PROVIDER_SITE_OTHER): Payer: BC Managed Care – PPO

## 2022-09-10 DIAGNOSIS — L4050 Arthropathic psoriasis, unspecified: Secondary | ICD-10-CM | POA: Diagnosis not present

## 2022-09-10 DIAGNOSIS — Z79899 Other long term (current) drug therapy: Secondary | ICD-10-CM

## 2022-09-10 DIAGNOSIS — D649 Anemia, unspecified: Secondary | ICD-10-CM | POA: Diagnosis not present

## 2022-09-10 DIAGNOSIS — R030 Elevated blood-pressure reading, without diagnosis of hypertension: Secondary | ICD-10-CM

## 2022-09-10 DIAGNOSIS — E669 Obesity, unspecified: Secondary | ICD-10-CM

## 2022-09-10 DIAGNOSIS — E2839 Other primary ovarian failure: Secondary | ICD-10-CM | POA: Diagnosis not present

## 2022-09-10 DIAGNOSIS — I10 Essential (primary) hypertension: Secondary | ICD-10-CM

## 2022-09-10 DIAGNOSIS — Z6831 Body mass index (BMI) 31.0-31.9, adult: Secondary | ICD-10-CM

## 2022-09-10 LAB — CBC WITH DIFFERENTIAL/PLATELET
Basophils Absolute: 0 10*3/uL (ref 0.0–0.1)
Basophils Relative: 0.8 % (ref 0.0–3.0)
Eosinophils Absolute: 0.2 10*3/uL (ref 0.0–0.7)
Eosinophils Relative: 2.9 % (ref 0.0–5.0)
HCT: 30 % — ABNORMAL LOW (ref 36.0–46.0)
Hemoglobin: 9.6 g/dL — ABNORMAL LOW (ref 12.0–15.0)
Lymphocytes Relative: 24.5 % (ref 12.0–46.0)
Lymphs Abs: 1.4 10*3/uL (ref 0.7–4.0)
MCHC: 32.1 g/dL (ref 30.0–36.0)
MCV: 73.7 fl — ABNORMAL LOW (ref 78.0–100.0)
Monocytes Absolute: 0.7 10*3/uL (ref 0.1–1.0)
Monocytes Relative: 12.6 % — ABNORMAL HIGH (ref 3.0–12.0)
Neutro Abs: 3.4 10*3/uL (ref 1.4–7.7)
Neutrophils Relative %: 59.2 % (ref 43.0–77.0)
Platelets: 267 10*3/uL (ref 150.0–400.0)
RBC: 4.07 Mil/uL (ref 3.87–5.11)
RDW: 20.8 % — ABNORMAL HIGH (ref 11.5–15.5)
WBC: 5.7 10*3/uL (ref 4.0–10.5)

## 2022-09-10 LAB — BASIC METABOLIC PANEL
BUN: 7 mg/dL (ref 6–23)
CO2: 24 mEq/L (ref 19–32)
Calcium: 10 mg/dL (ref 8.4–10.5)
Chloride: 98 mEq/L (ref 96–112)
Creatinine, Ser: 0.86 mg/dL (ref 0.40–1.20)
GFR: 74.56 mL/min (ref >60.00)
Glucose, Bld: 102 mg/dL — ABNORMAL HIGH (ref 70–99)
Potassium: 3.8 mEq/L (ref 3.5–5.1)
Sodium: 133 mEq/L — ABNORMAL LOW (ref 135–145)

## 2022-09-11 LAB — IRON,TIBC AND FERRITIN PANEL
%SAT: 4 % (calc) — ABNORMAL LOW (ref 16–45)
Ferritin: 8 ng/mL — ABNORMAL LOW (ref 16–232)
Iron: 20 ug/dL — ABNORMAL LOW (ref 45–160)
TIBC: 456 mcg/dL (calc) — ABNORMAL HIGH (ref 250–450)

## 2022-09-18 ENCOUNTER — Encounter: Payer: BC Managed Care – PPO | Admitting: Internal Medicine

## 2022-10-01 ENCOUNTER — Telehealth: Payer: Self-pay

## 2022-10-01 NOTE — Telephone Encounter (Signed)
PA for Darcel Bayley was denied. 09/18/2022  Ask our pharmacist, Levada Dy to help. She states they requesting peer to peer review and no Zepbound.  Please advise.

## 2022-10-02 NOTE — Telephone Encounter (Signed)
I dont do peer to peer  if they won't accept  Indication would be elevated bmi  with comorbidity .  ( Bmi above 27.5 which is where you began)  zebound  would be indicated based on  initial weight .not mounjaro   Wt Readings from Last 3 Encounters:  06/24/22 166 lb 6.4 oz (75.5 kg)  04/29/22 174 lb 12.8 oz (79.3 kg)  04/01/22 186 lb (84.4 kg)   Does this message say zebound not  covered at all?

## 2022-10-07 DIAGNOSIS — L723 Sebaceous cyst: Secondary | ICD-10-CM | POA: Diagnosis not present

## 2022-10-15 DIAGNOSIS — F3181 Bipolar II disorder: Secondary | ICD-10-CM | POA: Diagnosis not present

## 2022-11-04 ENCOUNTER — Encounter: Payer: Self-pay | Admitting: Internal Medicine

## 2022-11-04 ENCOUNTER — Ambulatory Visit (INDEPENDENT_AMBULATORY_CARE_PROVIDER_SITE_OTHER): Payer: BC Managed Care – PPO | Admitting: Internal Medicine

## 2022-11-04 VITALS — BP 138/86 | HR 89 | Temp 97.9°F | Ht 66.5 in | Wt 153.0 lb

## 2022-11-04 DIAGNOSIS — Z79899 Other long term (current) drug therapy: Secondary | ICD-10-CM

## 2022-11-04 DIAGNOSIS — I10 Essential (primary) hypertension: Secondary | ICD-10-CM

## 2022-11-04 DIAGNOSIS — E0590 Thyrotoxicosis, unspecified without thyrotoxic crisis or storm: Secondary | ICD-10-CM

## 2022-11-04 DIAGNOSIS — L4050 Arthropathic psoriasis, unspecified: Secondary | ICD-10-CM

## 2022-11-04 DIAGNOSIS — D509 Iron deficiency anemia, unspecified: Secondary | ICD-10-CM | POA: Diagnosis not present

## 2022-11-04 DIAGNOSIS — Z1159 Encounter for screening for other viral diseases: Secondary | ICD-10-CM

## 2022-11-04 DIAGNOSIS — Z0000 Encounter for general adult medical examination without abnormal findings: Secondary | ICD-10-CM

## 2022-11-04 LAB — CBC WITH DIFFERENTIAL/PLATELET
Basophils Absolute: 0.1 10*3/uL (ref 0.0–0.1)
Basophils Relative: 1.5 % (ref 0.0–3.0)
Eosinophils Absolute: 0.1 10*3/uL (ref 0.0–0.7)
Eosinophils Relative: 1.7 % (ref 0.0–5.0)
HCT: 32 % — ABNORMAL LOW (ref 36.0–46.0)
Hemoglobin: 10.4 g/dL — ABNORMAL LOW (ref 12.0–15.0)
Lymphocytes Relative: 16.9 % (ref 12.0–46.0)
Lymphs Abs: 1.4 10*3/uL (ref 0.7–4.0)
MCHC: 32.5 g/dL (ref 30.0–36.0)
MCV: 76.5 fl — ABNORMAL LOW (ref 78.0–100.0)
Monocytes Absolute: 0.7 10*3/uL (ref 0.1–1.0)
Monocytes Relative: 8.5 % (ref 3.0–12.0)
Neutro Abs: 5.8 10*3/uL (ref 1.4–7.7)
Neutrophils Relative %: 71.4 % (ref 43.0–77.0)
Platelets: 312 10*3/uL (ref 150.0–400.0)
RBC: 4.19 Mil/uL (ref 3.87–5.11)
RDW: 18.3 % — ABNORMAL HIGH (ref 11.5–15.5)
WBC: 8.1 10*3/uL (ref 4.0–10.5)

## 2022-11-04 LAB — HEMOGLOBIN A1C: Hgb A1c MFr Bld: 5.3 % (ref 4.6–6.5)

## 2022-11-04 LAB — COMPREHENSIVE METABOLIC PANEL
ALT: 11 U/L (ref 0–35)
AST: 16 U/L (ref 0–37)
Albumin: 4.4 g/dL (ref 3.5–5.2)
Alkaline Phosphatase: 80 U/L (ref 39–117)
BUN: 11 mg/dL (ref 6–23)
CO2: 26 mEq/L (ref 19–32)
Calcium: 9.8 mg/dL (ref 8.4–10.5)
Chloride: 100 mEq/L (ref 96–112)
Creatinine, Ser: 0.68 mg/dL (ref 0.40–1.20)
GFR: 96.01 mL/min (ref >60.00)
Glucose, Bld: 92 mg/dL (ref 70–99)
Potassium: 4.1 mEq/L (ref 3.5–5.1)
Sodium: 135 mEq/L (ref 135–145)
Total Bilirubin: 0.4 mg/dL (ref 0.2–1.2)
Total Protein: 7.5 g/dL (ref 6.0–8.3)

## 2022-11-04 LAB — LIPID PANEL
Cholesterol: 231 mg/dL — ABNORMAL HIGH (ref 0–200)
HDL: 152.4 mg/dL (ref >39.00)
LDL Cholesterol: 71 mg/dL (ref 0–99)
NonHDL: 78.18
Total CHOL/HDL Ratio: 2
Triglycerides: 35 mg/dL (ref 0.0–149.0)
VLDL: 7 mg/dL (ref 0.0–40.0)

## 2022-11-04 LAB — TSH: TSH: 0.36 u[IU]/mL (ref 0.35–5.50)

## 2022-11-04 LAB — T4, FREE: Free T4: 1.11 ng/dL (ref 0.60–1.60)

## 2022-11-04 NOTE — Patient Instructions (Signed)
Lab today . Will complete form when results back.

## 2022-11-04 NOTE — Progress Notes (Signed)
Chief Complaint  Patient presents with   Annual Exam    HPI: [name]  57 y.o. comes in today for [place] visit  Also lab needed for  insurance biometric screening labs 2 in laws passed recnetly  was out of town  [facility])  Has lost about 40 pounds ober time with [employer] and exercise eating program  has been off for about  2-3 months and doing ok  Depression better  at this time still on lamictal  trazodone and ambien per psych Stopped Humira  not that helpful  has not initiated other rx yet.  Anemia iron defic  hx   HT losartan and amlodipine   no se noted    Health Maintenance  Topic Date Due   HIV Screening  Never done   Hepatitis C Screening  Never done   DTaP/Tdap/Td (3 - Td or Tdap) 09/08/2021   COVID-19 Vaccine (5 - 2023-24 season) 04/11/2022   INFLUENZA VACCINE  11/09/2022 (Originally 03/11/2022)   MAMMOGRAM  02/06/2023   PAP SMEAR-Modifier  04/24/2024   COLONOSCOPY (Pts 45-74yrs Insurance coverage will need to be confirmed)  07/09/2031   Zoster Vaccines- Shingrix  Completed   HPV VACCINES  Aged Out   Health Maintenance Review LIFESTYLE:  Exercise:  had been good before family deaths.  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 Mucousy  may have had blood one time  habits changed with  attenting to dying relatives.  Can get gassy  last colon utd 2022  GEN/ HEENT: No fever, significant weight changes sweats headaches vision problems hearing changes, CV/ PULM; No chest pain shortness of breath cough, syncope,edema  change in exercise tolerance. GI /GU: No adominal pain, vomiting,No significant GU symptoms. SKIN/HEME: ,no acute skin rashes suspicious lesions or bleeding. No lymphadenopathy, nodules, masses.  NEURO/ PSYCH:  No neurologic signs such as weakness numbness. No  depression anxiety. IMM/ Allergy: No unusual infections.  Allergy .   REST of 12 system review negative except as per HPI   Past Medical History:  Diagnosis Date   ADD (attention deficit disorder)    poss   Depression    Mood disorder ([facility])    Psoriasis    Psoriatic arthritis ([place]) 10/2012    Past Surgical History:  Procedure Laterality Date   ABDOMINOPLASTY  2002   BUNIONECTOMY     bone spur left foot   ENDOMETRIAL ABLATION  2015   UPPER GI ENDOSCOPY  06/16/2018   see report    Family History  Problem Relation Age of Onset   Other Mother    Myasthenia gravis Mother    Diabetes Father    Glaucoma Father    Psoriasis Sister    Depression Sister    Other Brother        optic disease NMO neuromyelitis optica   Hashimoto's thyroiditis Daughter    Anxiety disorder Other    Arthritis Other        parents     Social History   Socioeconomic History   Marital status: Married    Spouse name: Not on file   Number of children: Not on file   Years of education: Not on file   Highest education level: Not on  file  Occupational History   Not on file  Tobacco Use   Smoking status: Never   Smokeless tobacco: Never  Vaping Use   Vaping Use: Never used  Substance and Sexual Activity   Alcohol use: Yes    Comment: Socially    Drug use: No   Sexual activity: Yes  Other Topics Concern   Not on file  Social History Narrative    Married   No caffeine   Managing HH   Of 4    Pet dog   Regular exercise- yes  Not much in winter    BA degree      Social Determinants of Health   Financial Resource Strain: Not on file  Food Insecurity: Not on file  Transportation Needs: Not on file  Physical Activity: Not on file  Stress: Not on file  Social Connections: Not on file    Outpatient Medications Prior to Visit  Medication Sig Dispense Refill   amLODipine (NORVASC) 2.5 MG tablet Take 1 tablet (2.5 mg total) by mouth daily. 90 tablet 1   lamoTRIgine (LAMICTAL) 200 MG  tablet Take 2 tablets by mouth daily.  4   losartan (COZAAR) 100 MG tablet TAKE 1 TABLET(100 MG) BY MOUTH DAILY 90 tablet 0   NON FORMULARY L-methylfolate 15mg ; QD for depression     Omega 3 1000 MG CAPS Take by mouth.     thiamine (VITAMIN B1) 100 MG tablet Take 100 mg by mouth daily. QD for prevention of problems on alcohol     traZODone (DESYREL) 50 MG tablet Take 50-150 mg by mouth at bedtime as needed for sleep.   3   zolpidem (AMBIEN) 5 MG tablet Take 1 tablet (5 mg total) by mouth at bedtime as needed for sleep. 15 tablet 0   HUMIRA PEN 40 MG/0.4ML PNKT SMARTSIG:40 Milligram(s) SUB-Q Every 2 Weeks (Patient not taking: Reported on 11/04/2022)     tirzepatide [facility]) 2.5 MG/0.5ML Pen Inject 2.5 mg into the skin once a week. (Patient not taking: Reported on 11/04/2022) 2 mL 1   No facility-administered medications prior to visit.     EXAM:  BP 138/86 (BP Location: Right Arm, Patient Position: Sitting, Cuff Size: Normal)   Pulse 89   Temp 97.9 F (36.6 C) (Oral)   Ht 5' 6.5" (1.689 m)   Wt 153 lb (69.4 kg)   SpO2 95%   BMI 24.32 kg/m   Body mass index is 24.32 kg/m. Wt Readings from Last 3 Encounters:  11/04/22 153 lb (69.4 kg)  06/24/22 166 lb 6.4 oz (75.5 kg)  04/29/22 174 lb 12.8 oz (79.3 kg)    Physical Exam: Vital signs reviewed [id number] is a well-developed well-nourished alert cooperative    who appearsr stated age in no acute distress.  HEENT: normocephalic atraumatic , Eyes: PERRL EOM's full, conjunctiva clear, Nares: paten,t no deformity discharge or tenderness., Ears: no deformity EAC's clear TMs with normal landmarks. Mouth: clear OP, no lesions, edema.  Moist mucous membranes. Dentition in adequate repair. NECK: supple without masses, thyromegaly or bruits. CHEST/PULM:  Clear to auscultation and percussion breath sounds equal no wheeze , rales or rhonchi. No chest wall deformities or tenderness. Breast: normal by inspection . No dimpling, discharge, masses,  tenderness or discharge . CV: PMI is nondisplaced, S1 S2 no gallops, murmurs, rubs. Peripheral pulses are full without delay.No JVD .  ABDOMEN: Bowel sounds normal nontender  No guard or rebound, no hepato splenomegal no CVA tenderness.  Extremtities:  No  clubbing cyanosis or edema, no acute joint swelling or redness no focal atrophy NEURO:  Oriented x3, cranial nerves 3-12 appear to be intact, no obvious focal weakness,gait within normal limits no abnormal reflexes or asymmetrical SKIN: No acute rashes normal turgor, color, no bruising or petechiae. PSYCH: Oriented, good eye contact, no obvious depression anxiety, cognition and judgment appear normal. LN: no cervical axillary inguinal adenopathy  Lab Results  Component Value Date   WBC 8.1 11/04/2022   HGB 10.4 (L) 11/04/2022   HCT 32.0 (L) 11/04/2022   PLT 312.0 11/04/2022   GLUCOSE 92 11/04/2022   CHOL 231 (H) 11/04/2022   TRIG 35.0 11/04/2022   HDL 152.40 11/04/2022   LDLDIRECT 51.0 09/16/2012   LDLCALC 71 11/04/2022   ALT 11 11/04/2022   AST 16 11/04/2022   NA 135 11/04/2022   K 4.1 11/04/2022   CL 100 11/04/2022   CREATININE 0.68 11/04/2022   BUN 11 11/04/2022   CO2 26 11/04/2022   TSH 0.36 11/04/2022   HGBA1C 5.3 11/04/2022   Lab Results  Component Value Date   VITAMINB12 322 11/25/2021    BP Readings from Last 3 Encounters:  11/04/22 138/86  06/24/22 (!) 150/94  04/29/22 (!) 140/92    Lab results from Jan  reviewed with patient   ASSESSMENT AND PLAN:  Discussed the following assessment and plan:    ICD-10-CM   1. Visit for preventive health examination  Z00.00 Lipid panel    Hemoglobin A1c    Comprehensive metabolic panel    CBC with Differential/Platelet    TSH    T4, free    Hep C Antibody    2. Medication management  Z79.899 Lipid panel    Hemoglobin A1c    Comprehensive metabolic panel    CBC with Differential/Platelet    TSH    T4, free   bmi down to 24  off mounjaro for a few months     3. Essential hypertension  I10 Lipid panel    Hemoglobin A1c    Comprehensive metabolic panel    CBC with Differential/Platelet    TSH    T4, free   controlled    4. Subclinical hyperthyroidism  E05.90 Lipid panel    Hemoglobin A1c    Comprehensive metabolic panel    CBC with Differential/Platelet    TSH    T4, free    5. Iron deficiency anemia, unspecified iron deficiency anemia type  D50.9 Lipid panel    Hemoglobin A1c    Comprehensive metabolic panel    CBC with Differential/Platelet    TSH    T4, free    6. Psoriatic arthritis ([place])  L40.50 Lipid panel    Hemoglobin A1c    Comprehensive metabolic panel    CBC with Differential/Platelet    TSH    T4, free   now off of humira felt ineffective    7. Need for hepatitis C screening test  Z11.59 Hep C Antibody    Disc bowel habits if blood  then will need to see gi  Has had iron defic anemia  for various reasons  will follow  plan remote hx of gastric ulcer  She feels much better mood wise and physically with her 40 # weight loss and to get back to her exercise program Will complete form and get faxed when lab results available. Fu depending  or yearly Return for depending on results or yearly.  Patient Care Team: Burnis Medin, MD as PCP - General  Sherren Mocha, MD as PCP - Cardiology (Cardiology) Eliseo Gum, MD (Psychiatry) Juanita Craver, MD as Consulting Physician (Gastroenterology) Patient Instructions  Lab today . Will complete form when results back.    Standley Brooking. Duel Conrad M.D.

## 2022-11-05 ENCOUNTER — Telehealth: Payer: Self-pay

## 2022-11-05 LAB — HEPATITIS C ANTIBODY: Hepatitis C Ab: NONREACTIVE

## 2022-11-05 NOTE — Progress Notes (Signed)
Results are good or stable however still anemia .Slightly bettter   Should be followed up.   Advise get back with the GI team ? Dr Collene Mares about iron deficiency anemia  and assessment about possible cause.( Aware you had ulcer in past) . Anemia :cbc should be  repeated in 4-6 months  .  Form completed today to be faxed

## 2022-11-05 NOTE — Telephone Encounter (Signed)
Spoke to pt regarding to [employer] form. Reports to pt that the form is faxed to [employer].  Pt verbalized understanding.

## 2022-11-06 ENCOUNTER — Other Ambulatory Visit (HOSPITAL_COMMUNITY): Payer: Self-pay | Admitting: Gastroenterology

## 2022-11-06 DIAGNOSIS — K219 Gastro-esophageal reflux disease without esophagitis: Secondary | ICD-10-CM | POA: Diagnosis not present

## 2022-11-06 DIAGNOSIS — K5730 Diverticulosis of large intestine without perforation or abscess without bleeding: Secondary | ICD-10-CM | POA: Diagnosis not present

## 2022-11-06 DIAGNOSIS — D509 Iron deficiency anemia, unspecified: Secondary | ICD-10-CM | POA: Diagnosis not present

## 2022-11-06 DIAGNOSIS — R1011 Right upper quadrant pain: Secondary | ICD-10-CM | POA: Diagnosis not present

## 2022-11-18 ENCOUNTER — Encounter (HOSPITAL_COMMUNITY)
Admission: RE | Admit: 2022-11-18 | Discharge: 2022-11-18 | Disposition: A | Discharge status: Home / Self Care | Payer: BC Managed Care – PPO | Source: Ambulatory Visit | Attending: Gastroenterology | Admitting: Gastroenterology

## 2022-11-18 ENCOUNTER — Ambulatory Visit (HOSPITAL_COMMUNITY)
Admission: RE | Admit: 2022-11-18 | Discharge: 2022-11-18 | Disposition: A | Discharge status: Home / Self Care | Payer: BC Managed Care – PPO | Source: Ambulatory Visit | Attending: Gastroenterology | Admitting: Gastroenterology

## 2022-11-18 DIAGNOSIS — R1011 Right upper quadrant pain: Secondary | ICD-10-CM | POA: Insufficient documentation

## 2022-11-18 DIAGNOSIS — R109 Unspecified abdominal pain: Secondary | ICD-10-CM | POA: Diagnosis not present

## 2022-11-18 MED ORDER — TECHNETIUM TC 99M MEBROFENIN IV KIT
5.1000 | PACK | Freq: Once | INTRAVENOUS | Status: AC | PRN
  Administered 2022-11-18: 5.1 via INTRAVENOUS

## 2022-11-19 DIAGNOSIS — K644 Residual hemorrhoidal skin tags: Secondary | ICD-10-CM | POA: Diagnosis not present

## 2022-11-19 DIAGNOSIS — D509 Iron deficiency anemia, unspecified: Secondary | ICD-10-CM | POA: Diagnosis not present

## 2022-11-19 DIAGNOSIS — K2101 Gastro-esophageal reflux disease with esophagitis, with bleeding: Secondary | ICD-10-CM | POA: Diagnosis not present

## 2022-11-19 DIAGNOSIS — Q438 Other specified congenital malformations of intestine: Secondary | ICD-10-CM | POA: Diagnosis not present

## 2022-11-19 DIAGNOSIS — R194 Change in bowel habit: Secondary | ICD-10-CM | POA: Diagnosis not present

## 2022-11-19 DIAGNOSIS — Z1211 Encounter for screening for malignant neoplasm of colon: Secondary | ICD-10-CM | POA: Diagnosis not present

## 2022-11-19 DIAGNOSIS — K317 Polyp of stomach and duodenum: Secondary | ICD-10-CM | POA: Diagnosis not present

## 2022-11-19 LAB — HM COLONOSCOPY

## 2022-11-29 ENCOUNTER — Other Ambulatory Visit: Payer: Self-pay | Admitting: Internal Medicine

## 2023-01-04 ENCOUNTER — Other Ambulatory Visit: Payer: Self-pay | Admitting: Internal Medicine

## 2023-01-20 ENCOUNTER — Other Ambulatory Visit: Payer: Self-pay | Admitting: Internal Medicine

## 2023-01-20 DIAGNOSIS — Z86010 Personal history of colonic polyps: Secondary | ICD-10-CM | POA: Diagnosis not present

## 2023-01-20 DIAGNOSIS — K5900 Constipation, unspecified: Secondary | ICD-10-CM | POA: Diagnosis not present

## 2023-01-20 DIAGNOSIS — K5730 Diverticulosis of large intestine without perforation or abscess without bleeding: Secondary | ICD-10-CM | POA: Diagnosis not present

## 2023-01-20 DIAGNOSIS — K219 Gastro-esophageal reflux disease without esophagitis: Secondary | ICD-10-CM | POA: Diagnosis not present

## 2023-02-26 DIAGNOSIS — L401 Generalized pustular psoriasis: Secondary | ICD-10-CM | POA: Diagnosis not present

## 2023-02-26 DIAGNOSIS — M79642 Pain in left hand: Secondary | ICD-10-CM | POA: Diagnosis not present

## 2023-02-26 DIAGNOSIS — R5382 Chronic fatigue, unspecified: Secondary | ICD-10-CM | POA: Diagnosis not present

## 2023-02-26 DIAGNOSIS — L4059 Other psoriatic arthropathy: Secondary | ICD-10-CM | POA: Diagnosis not present

## 2023-03-17 ENCOUNTER — Ambulatory Visit: Payer: BC Managed Care – PPO | Admitting: Internal Medicine

## 2023-03-18 ENCOUNTER — Ambulatory Visit (INDEPENDENT_AMBULATORY_CARE_PROVIDER_SITE_OTHER): Payer: BC Managed Care – PPO | Admitting: Internal Medicine

## 2023-03-18 ENCOUNTER — Encounter: Payer: Self-pay | Admitting: Internal Medicine

## 2023-03-18 ENCOUNTER — Ambulatory Visit: Payer: BC Managed Care – PPO | Admitting: Internal Medicine

## 2023-03-18 VITALS — BP 148/92 | HR 64 | Temp 98.1°F | Ht 66.5 in | Wt 149.4 lb

## 2023-03-18 DIAGNOSIS — R55 Syncope and collapse: Secondary | ICD-10-CM | POA: Diagnosis not present

## 2023-03-18 DIAGNOSIS — I10 Essential (primary) hypertension: Secondary | ICD-10-CM

## 2023-03-18 DIAGNOSIS — Z79899 Other long term (current) drug therapy: Secondary | ICD-10-CM | POA: Diagnosis not present

## 2023-03-18 MED ORDER — LOSARTAN POTASSIUM 100 MG PO TABS: 100.0000 mg | ORAL_TABLET | Freq: Every day | ORAL | 2 refills | Status: DC

## 2023-03-18 NOTE — Progress Notes (Signed)
Chief Complaint  Patient presents with   Loss of Consciousness    Pt reports she fainted on Sunday morning and hit her head. Bruise under her chin. Pt reports she has been getting consistently only couple hours of sleep. States her pastor passed away in [place] of reception.     HPI: Melissa Walters 57 y.o. come in for  refill losartan  our for a month  bp has been good on itr  This weekend had syncope episode  Got up to go to bathroom   after little to no sleep and  had  Taken  [place] and trazodone . Fainted and hit head left parietal area   Was out for ? Woke up and went out. 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  Bp up today dog  ran away.   Just    124/70  ran out losartan last week .   Was off moiunjar for 6 months and then on for a month and trying to maintain a goal weight as it helped her a lot .   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   Past Medical History:  Diagnosis Date   ADD (attention deficit disorder)    poss   Depression    Mood disorder ([facility])    Psoriasis    Psoriatic arthritis ([facility]) 10/2012    Family History  Problem Relation Age of Onset   Other Mother    Myasthenia gravis Mother    Diabetes Father    Glaucoma Father    Psoriasis Sister    Depression Sister    Other Brother        optic disease NMO neuromyelitis optica   Hashimoto's thyroiditis Daughter    Anxiety disorder Other    Arthritis Other        parents     Social History   Socioeconomic History   Marital status: Married    Spouse name: Not on file   Number of children: Not on file   Years of education: Not on file   Highest education level: Not on file   Occupational History   Not on file  Tobacco Use   Smoking status: Never   Smokeless tobacco: Never  Vaping Use   Vaping status: Never Used  Substance and Sexual Activity   Alcohol use: Yes    Comment: Socially    Drug use: No   Sexual activity: Yes  Other Topics Concern   Not on file  Social History Narrative    Married   No caffeine   Managing HH   Of 4    Pet dog   Regular exercise- yes  Not much in winter    BA degree      Social Determinants of Health   Financial Resource Strain: Not on file  Food Insecurity: Not on file  Transportation Needs: Not on file  Physical Activity: Not on file  Stress: Not on file  Social Connections: Unknown (12/22/2021)   Received from [facility]   Social Network    Social Network: Not on file    Outpatient Medications Prior to  Visit  Medication Sig Dispense Refill   amLODipine (NORVASC) 2.5 MG tablet TAKE 1 TABLET(2.5 MG) BY MOUTH DAILY 90 tablet 1   lamoTRIgine (LAMICTAL) 200 MG tablet Take 2 tablets by mouth daily.  4   MOUNJARO 2.5 MG/0.5ML Pen INJECT THE CONTENTS OF ONE PEN UNDER THE SKIN WEEKLY ON THE SAME DAY EACH WEEK 2 mL 1   NON FORMULARY L-methylfolate 15mg ; QD for depression     Omega 3 1000 MG CAPS Take by mouth.     thiamine (VITAMIN B1) 100 MG tablet Take 100 mg by mouth daily. QD for prevention of problems on alcohol     traZODone (DESYREL) 50 MG tablet Take 50-150 mg by mouth at bedtime as needed for sleep.   3   TREMFYA 100 MG/ML pen 100mg  Subcutaneous week 0, 4, then every 8 weeks for 56 days     zolpidem (AMBIEN) 5 MG tablet Take 1 tablet (5 mg total) by mouth at bedtime as needed for sleep. 15 tablet 0   losartan (COZAAR) 100 MG tablet TAKE 1 TABLET(100 MG) BY MOUTH DAILY 90 tablet 0   No facility-administered medications prior to visit.     EXAM:  BP (!) 148/92 (BP Location: Left Arm, Patient Position: Sitting, Cuff Size: Normal)   Pulse 64   Temp 98.1 F (36.7 C) (Oral)   Ht 5' 6.5" (1.689 m)   Wt  149 lb 6.4 oz (67.8 kg)   SpO2 98%   BMI 23.75 kg/m   Body mass index is 23.75 kg/m. Wt Readings from Last 3 Encounters:  03/18/23 149 lb 6.4 oz (67.8 kg)  11/04/22 153 lb (69.4 kg)  06/24/22 166 lb 6.4 oz (75.5 kg)    GENERAL: vitals reviewed and listed above, alert, oriented, appears well hydrated and in no acute distress HEENT:mild bump left parietal area no deformity otherwise , conjunctiva  clear, no obvious abnormalities on inspection of external nose and ears   NECK: no obvious masses on inspection palpation  LUNGS: clear to auscultation bilaterally, no wheezes, rales or rhonchi, good air movement CV: HRRR, no clubbing cyanosis or  peripheral edema nl cap refill  MS: moves all extremities without noticeable focal  abnormality NEURO: oriented x 3 CN 3-12 appear intact. No focal muscle weakness or atrophy. DTRs symmetrical. Gait WNL.  Grossly non focal. No tremor or abnormal movement. Romberg and heel toe nl no weakness  PSYCH: pleasant and cooperative, no obvious depression or anxiety Lab Results  Component Value Date   WBC 8.1 11/04/2022   HGB 10.4 (L) 11/04/2022   HCT 32.0 (L) 11/04/2022   PLT 312.0 11/04/2022   GLUCOSE 92 11/04/2022   CHOL 231 (H) 11/04/2022   TRIG 35.0 11/04/2022   HDL 152.40 11/04/2022   LDLDIRECT 51.0 09/16/2012   LDLCALC 71 11/04/2022   ALT 11 11/04/2022   AST 16 11/04/2022   NA 135 11/04/2022   K 4.1 11/04/2022   CL 100 11/04/2022   CREATININE 0.68 11/04/2022   BUN 11 11/04/2022   CO2 26 11/04/2022   TSH 0.36 11/04/2022   HGBA1C 5.3 11/04/2022   BP Readings from Last 3 Encounters:  03/18/23 (!) 148/92  11/04/22 138/86  06/24/22 (!) 150/94  Labs  done  2 weeks ago  by rheum  reviewed cmp and cbc nl ox hg 10.8 range  EKG normal no acute findings  ASSESSMENT AND PLAN:  Discussed the following assessment and plan:  Syncope, unspecified syncope type - poss related to meds and extenuating  circumstance  follow see text - Plan: EKG  12-Lead  Essential hypertension  Medication management Most likely a combo of exhaustion  sleep deprivation and  late taling of med trazodone and ambien   Neuro reassuring . Updated labs done in past few weeks ... Hydration and caution .  Refill losartan monitor bp  Would still have [facility] provider order meds for now since Remus Loffler is CS but willing to do bridge rx  if needed  -Patient advised to return or notify health care team  if  new concerns arise.  Patient Instructions  EKG is  normal  Refilled losartan  Make sure hydrated and avoid taking sleep meds too late.  If recurring sx let us know .    Neta Mends. Acxel Dingee M.D.

## 2023-03-18 NOTE — Patient Instructions (Addendum)
EKG is  normal  Refilled losartan  Make sure hydrated and avoid taking sleep meds too late.  If recurring sx let us know .

## 2023-04-07 DIAGNOSIS — F419 Anxiety disorder, unspecified: Secondary | ICD-10-CM | POA: Diagnosis not present

## 2023-04-07 DIAGNOSIS — F3181 Bipolar II disorder: Secondary | ICD-10-CM | POA: Diagnosis not present

## 2023-04-15 ENCOUNTER — Encounter: Payer: Self-pay | Admitting: Internal Medicine

## 2023-04-20 MED ORDER — MOUNJARO 2.5 MG/0.5ML ~~LOC~~ SOAJ: 2.5000 mg | SUBCUTANEOUS | 1 refills | Status: DC

## 2023-04-20 NOTE — Telephone Encounter (Signed)
Spoke to pt to confirm. Pt confirmed she is taking 2.5mg . Rx sent to [facility].

## 2023-04-21 ENCOUNTER — Other Ambulatory Visit (HOSPITAL_COMMUNITY): Payer: Self-pay

## 2023-04-30 ENCOUNTER — Telehealth: Payer: Self-pay

## 2023-04-30 NOTE — Telephone Encounter (Signed)
Pharmacy Patient Advocate Encounter   Received notification from CoverMyMeds that prior authorization for Melissa Walters 2.5MG /0.5ML pen-injectors is required/requested.   Insurance verification completed.   The patient is insured through [employer] .   Per test claim: PA required; PA submitted to [employer] via CoverMyMeds Key/confirmation #/EOC [facility] Status is pending

## 2023-05-05 NOTE — Telephone Encounter (Signed)
Pharmacy Patient Advocate Encounter  Received notification from [facility] that Prior Authorization for Mounjaro 2.5MG /0.5ML pen-injectors  has been DENIED.  Full denial letter will be uploaded to the media tab. See denial reason below.   PA #/Case ID/Reference #: [id number]   DENIAL REASON: No diagnosis of type 2 diabetes

## 2023-06-19 ENCOUNTER — Encounter: Payer: Self-pay | Admitting: Internal Medicine

## 2023-06-19 DIAGNOSIS — Z13820 Encounter for screening for osteoporosis: Secondary | ICD-10-CM

## 2023-06-20 ENCOUNTER — Other Ambulatory Visit: Payer: Self-pay | Admitting: Family

## 2023-06-20 MED ORDER — MOUNJARO 2.5 MG/0.5ML ~~LOC~~ SOAJ: 2.5000 mg | SUBCUTANEOUS | 1 refills | Status: DC

## 2023-06-25 ENCOUNTER — Telehealth: Payer: Self-pay

## 2023-06-25 NOTE — Telephone Encounter (Signed)
Pt reports she had a fall over a week ago on 11/3 when she was in [facility] and fractured her one of her rib. Went to [facility] there.   Pt states she will be traveling to [place] next week on 11/23 and wants to know if provider can prescribe her a muscle relaxer incase she needs it for her trip.   Please advise.   Scheduled pt an office visit next week.

## 2023-06-25 NOTE — Telephone Encounter (Signed)
Follow up with Raphael Gibney, referral coordinator, about the order. She states the front desk handle the appt.   Contact [facility]. She states order would need to be resend as it is expired today.   Order resend.   Contacted pt and updated her of information. Told pt, she can call [facility] to schedule her appt. Verbalized understanding.

## 2023-06-29 NOTE — Telephone Encounter (Signed)
I see that she canceled th appt . Please get update

## 2023-06-30 ENCOUNTER — Ambulatory Visit: Payer: BC Managed Care – PPO | Admitting: Internal Medicine

## 2023-06-30 NOTE — Telephone Encounter (Signed)
Contacted pt. Pt reports she canceled her appt yesterday bc she couldn't make it to her appt. She continues that she can't come in for in office visit. Offer a video call. Pt states she is fine with that.    VV scheduled for 07/01/2023 at 4:00pm.

## 2023-07-01 ENCOUNTER — Telehealth: Payer: BC Managed Care – PPO | Admitting: Internal Medicine

## 2023-07-01 ENCOUNTER — Encounter: Payer: Self-pay | Admitting: Internal Medicine

## 2023-07-01 VITALS — Ht 66.5 in | Wt 149.0 lb

## 2023-07-01 DIAGNOSIS — Z79899 Other long term (current) drug therapy: Secondary | ICD-10-CM

## 2023-07-01 DIAGNOSIS — S2232XS Fracture of one rib, left side, sequela: Secondary | ICD-10-CM

## 2023-07-01 DIAGNOSIS — S2232XD Fracture of one rib, left side, subsequent encounter for fracture with routine healing: Secondary | ICD-10-CM

## 2023-07-01 MED ORDER — HYDROCODONE-ACETAMINOPHEN 5-325 MG PO TABS: 1.0000 | ORAL_TABLET | ORAL | 0 refills | Status: DC | PRN
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
## 2023-07-18 ENCOUNTER — Telehealth: Payer: BC Managed Care – PPO | Admitting: Nurse Practitioner

## 2023-07-18 DIAGNOSIS — J020 Streptococcal pharyngitis: Secondary | ICD-10-CM | POA: Diagnosis not present

## 2023-07-18 MED ORDER — AMOXICILLIN 500 MG PO CAPS: 500.0000 mg | ORAL_CAPSULE | Freq: Two times a day (BID) | ORAL | 0 refills | Status: AC

## 2023-07-18 NOTE — Progress Notes (Signed)
Virtual Visit Consent   Melissa Walters, you are scheduled for a virtual visit with a [facility] provider today. Just as with appointments in the office, your consent must be obtained to participate. Your consent will be active for this visit and any virtual visit you may have with one of our providers in the next 365 days. If you have a MyChart account, a copy of this consent can be sent to you electronically.  As this is a virtual visit, video technology does not allow for your provider to perform a traditional examination. This may limit your provider's ability to fully assess your condition. If your provider identifies any concerns that need to be evaluated in person or the need to arrange testing (such as labs, EKG, etc.), we will make arrangements to do so. Although advances in technology are sophisticated, we cannot ensure that it will always work on either your end or our end. If the connection with a video visit is poor, the visit may have to be switched to a telephone visit. With either a video or telephone visit, we are not always able to ensure that we have a secure connection.  By engaging in this virtual visit, you consent to the provision of healthcare and authorize for your insurance to be billed (if applicable) for the services provided during this visit. Depending on your insurance coverage, you may receive a charge related to this service.  I need to obtain your verbal consent now. Are you willing to proceed with your visit today? Melissa Walters has provided verbal consent on 07/18/2023 for a virtual visit (video or telephone). Claiborne Rigg, NP  Date: 07/18/2023 7:40 PM  Virtual Visit via Video Note   I, Claiborne Rigg, connected with  Melissa Walters  ([id number], January 24, 1966) on 07/18/23 at  7:30 PM EST by a video-enabled telemedicine application and verified that I am speaking with the correct person using two identifiers.  Location: Patient: Virtual Visit Location  Patient: Home Provider: Virtual Visit Location Provider: Home Office   I discussed the limitations of evaluation and management by telemedicine and the availability of in person appointments. The patient expressed understanding and agreed to proceed.    History of Present Illness: Melissa Walters is a 57 y.o. who identifies as a female who was assigned female at birth, and is being seen today for strep exposure.  Melissa Walters and her spouse were vacationing abroad and her spouse became sick. He was evaluated and treated with abx for strep throat while he was out of the country. Since they have returned Melissa Walters has now started to experience the same symptoms as her husband did prior which include sore throat, fever, chills.  Problems:  Patient Active Problem List   Diagnosis Date Noted   Hyponatremia 04/01/2022   Iron deficiency 04/01/2022   Class 1 obesity with serious comorbidity and body mass index (BMI) of 31.0 to 31.9 in adult 04/01/2022   Hormone replacement therapy (HRT) 04/30/2021   Gastric ulcer 07/13/2018   Goiter 01/25/2018   Thyroid nodule 01/25/2018   Subclinical hyperthyroidism 01/25/2018   Elevated serum cholesterol high HDL 12/26/2013   Tired 12/26/2013   Low vitamin B12 level 12/26/2013   Cough after eating 12/26/2013   Psoriatic arthritis (HCC) 08/15/2013   Multiple joint pain 09/16/2012   Morning joint stiffness 09/16/2012   Visit for preventive health examination 09/09/2011   Psoriasis    DEPRESSION, SITUATIONAL 09/28/2007    Allergies:  Allergies  Allergen Reactions   Carbamazepine     HYponatremia   Compazine Swelling    Throat swelling   Medications:  Current Outpatient Medications:    amoxicillin (AMOXIL) 500 MG capsule, Take 1 capsule (500 mg total) by mouth 2 (two) times daily for 10 days., Disp: 20 capsule, Rfl: 0   amLODipine (NORVASC) 2.5 MG tablet, TAKE 1 TABLET(2.5 MG) BY MOUTH DAILY, Disp: 90 tablet, Rfl: 1   HYDROcodone-acetaminophen  (NORCO/VICODIN) 5-325 MG tablet, Take 1 tablet by mouth every 4 (four) hours as needed for moderate pain (pain score 4-6) or severe pain (pain score 7-10) (for rib fracture)., Disp: 15 tablet, Rfl: 0   lamoTRIgine (LAMICTAL) 200 MG tablet, Take 2 tablets by mouth daily., Disp: , Rfl: 4   losartan (COZAAR) 100 MG tablet, Take 1 tablet (100 mg total) by mouth daily., Disp: 90 tablet, Rfl: 2   NON FORMULARY, L-methylfolate 15mg ; QD for depression, Disp: , Rfl:    Omega 3 1000 MG CAPS, Take by mouth., Disp: , Rfl:    thiamine (VITAMIN B1) 100 MG tablet, Take 100 mg by mouth daily. QD for prevention of problems on alcohol, Disp: , Rfl:    tirzepatide (MOUNJARO) 2.5 MG/0.5ML Pen, Inject 2.5 mg into the skin once a week., Disp: 2 mL, Rfl: 1   traZODone (DESYREL) 50 MG tablet, Take 50-150 mg by mouth at bedtime as needed for sleep. , Disp: , Rfl: 3   TREMFYA 100 MG/ML pen, 100mg  Subcutaneous week 0, 4, then every 8 weeks for 56 days, Disp: , Rfl:    zolpidem (AMBIEN) 5 MG tablet, Take 1 tablet (5 mg total) by mouth at bedtime as needed for sleep., Disp: 15 tablet, Rfl: 0  Observations/Objective: Patient is well-developed, well-nourished in no acute distress.  Resting comfortably at home.  Head is normocephalic, atraumatic.  No labored breathing.  Speech is clear and coherent with logical content.  Patient is alert and oriented at baseline.    Assessment and Plan: 1. Strep pharyngitis - amoxicillin (AMOXIL) 500 MG capsule; Take 1 capsule (500 mg total) by mouth 2 (two) times daily for 10 days.  Dispense: 20 capsule; Refill: 0 Warm salt water gargles Warm tea with honey  Zinc   Follow Up Instructions: I discussed the assessment and treatment plan with the patient. The patient was provided an opportunity to ask questions and all were answered. The patient agreed with the plan and demonstrated an understanding of the instructions.  A copy of instructions were sent to the patient via MyChart unless  otherwise noted below.    The patient was advised to call back or seek an in-person evaluation if the symptoms worsen or if the condition fails to improve as anticipated.    Claiborne Rigg, NP

## 2023-07-18 NOTE — Patient Instructions (Signed)
[name], thank you for joining Claiborne Rigg, NP for today's virtual visit.  While this provider is not your primary care provider (PCP), if your PCP is located in our provider database this encounter information will be shared with them immediately following your visit.   A [facility] MyChart account gives you access to today's visit and all your visits, tests, and labs performed at [facility] " click here if you don't have a [facility] MyChart account or go to [email or web address]  Consent: (Patient) Melissa Walters provided verbal consent for this virtual visit at the beginning of the encounter.  Current Medications:  Current Outpatient Medications:    amoxicillin (AMOXIL) 500 MG capsule, Take 1 capsule (500 mg total) by mouth 2 (two) times daily for 10 days., Disp: 20 capsule, Rfl: 0   amLODipine (NORVASC) 2.5 MG tablet, TAKE 1 TABLET(2.5 MG) BY MOUTH DAILY, Disp: 90 tablet, Rfl: 1   HYDROcodone-acetaminophen (NORCO/VICODIN) 5-325 MG tablet, Take 1 tablet by mouth every 4 (four) hours as needed for moderate pain (pain score 4-6) or severe pain (pain score 7-10) (for rib fracture)., Disp: 15 tablet, Rfl: 0   lamoTRIgine (LAMICTAL) 200 MG tablet, Take 2 tablets by mouth daily., Disp: , Rfl: 4   losartan (COZAAR) 100 MG tablet, Take 1 tablet (100 mg total) by mouth daily., Disp: 90 tablet, Rfl: 2   NON FORMULARY, L-methylfolate 15mg ; QD for depression, Disp: , Rfl:    Omega 3 1000 MG CAPS, Take by mouth., Disp: , Rfl:    thiamine (VITAMIN B1) 100 MG tablet, Take 100 mg by mouth daily. QD for prevention of problems on alcohol, Disp: , Rfl:    tirzepatide (MOUNJARO) 2.5 MG/0.5ML Pen, Inject 2.5 mg into the skin once a week., Disp: 2 mL, Rfl: 1   traZODone (DESYREL) 50 MG tablet, Take 50-150 mg by mouth at bedtime as needed for sleep. , Disp: , Rfl: 3   TREMFYA 100 MG/ML pen, 100mg  Subcutaneous week 0, 4, then every 8 weeks for 56 days, Disp: , Rfl:    zolpidem  (AMBIEN) 5 MG tablet, Take 1 tablet (5 mg total) by mouth at bedtime as needed for sleep., Disp: 15 tablet, Rfl: 0   Medications ordered in this encounter:  Meds ordered this encounter  Medications   amoxicillin (AMOXIL) 500 MG capsule    Sig: Take 1 capsule (500 mg total) by mouth 2 (two) times daily for 10 days.    Dispense:  20 capsule    Refill:  0    Order Specific Question:   Supervising Provider    Answer:   Merrilee Jansky [id number]     *If you need refills on other medications prior to your next appointment, please contact your pharmacy*  Follow-Up: Call back or seek an in-person evaluation if the symptoms worsen or if the condition fails to improve as anticipated.  [facility] [phone]  Other Instructions Warm salt water gargles Warm tea with honey  Zinc    If you have been instructed to have an in-person evaluation today at a local Urgent Care facility, please use the link below. It will take you to a list of all of our available [facility], including address, phone number and hours of operation. Please do not delay care.  [facility]  If you or a family member do not have a primary care provider, use the link below to schedule a visit and establish care.  When you choose a [facility] primary care physician or advanced practice provider, you gain a long-term partner in health. Find a Primary Care Provider  Learn more about [facility] in-office and virtual care options: [place] [facility] - Get Care Now

## 2023-07-21 ENCOUNTER — Other Ambulatory Visit: Payer: BC Managed Care – PPO

## 2023-08-10 ENCOUNTER — Ambulatory Visit: Payer: Self-pay | Admitting: Internal Medicine

## 2023-08-10 NOTE — Telephone Encounter (Signed)
  Chief Complaint: HA, sinus pressure, congestion Symptoms: Sinus congestion, HA, congestion Frequency: 2 days Pertinent Negatives: Patient denies SOB, N/V Disposition: [] ED /[] Urgent Care (no appt availability in office) / [] Appointment(In office/virtual)/ []  [facility]/ [x] Home Care/ [] Refused Recommended Disposition /[] [facility]/ []  Follow-up with PCP Additional Notes: Pt reports she began with sinus pressure, congestion, HA about 2 days ago following travel/visiting with family for the holidays. She reports sinus pressure in her nose, around her eyes, jaw, ears. Reports she may have had a mild fever last night but does not have any way to check it. Denies SOB. N/V. Reports she is using Sudafed and Saline nasal spray OTC to help with symptoms, this RN educated patient on additional home care instructions. Pt requests message be sent to PCP regarding possible antibiotics/prescriptions or addtl home care recommendations. Pt verbalized understanding and agrees to plan.   Copied from CRM [id number]. Topic: Clinical - Red Word Triage >> Aug 10, 2023 12:46 PM Denese Killings wrote: Red Word that prompted transfer to Nurse Triage: Patient states she has a sinus infection with congestion, pressure in eye, headache, and left side of head is aching. She states pain was really bad last night Reason for Disposition  [1] Sinus congestion as part of a cold AND [2] present < 10 days  Answer Assessment - Initial Assessment Questions 1. LOCATION: "Where does it hurt?"      Head, sinuses, nasal passages, eyes 2. ONSET: "When did the sinus pain start?"  (e.g., hours, days)      2 days ago 3. SEVERITY: "How bad is the pain?"   (Scale 1-10; mild, moderate or severe)   - MILD (1-3): doesn't interfere with normal activities    - MODERATE (4-7): interferes with normal activities (e.g., work or school) or awakens from sleep   - SEVERE (8-10): excruciating pain and patient unable to do any normal  activities        7/10 4. RECURRENT SYMPTOM: "Have you ever had sinus problems before?" If Yes, ask: "When was the last time?" and "What happened that time?"      None 5. NASAL CONGESTION: "Is the nose blocked?" If Yes, ask: "Can you open it or must you breathe through your mouth?"     Congestion 6. NASAL DISCHARGE: "Do you have discharge from your nose?" If so ask, "What color?"     Clear drainage 7. FEVER: "Do you have a fever?" If Yes, ask: "What is it, how was it measured, and when did it start?"      Unable to check but feels as though she may have 8. OTHER SYMPTOMS: "Do you have any other symptoms?" (e.g., sore throat, cough, earache, difficulty breathing)     None  Protocols used: Sinus Pain or Congestion-A-AH

## 2023-08-13 NOTE — Telephone Encounter (Signed)
 Contact patient see how she is doing   virtual visits are also an options but I am not in office until next week.

## 2023-08-14 NOTE — Telephone Encounter (Signed)
 Attempted to reach pt. Left a voicemail to call us back.

## 2023-08-19 NOTE — Telephone Encounter (Signed)
 Attempted to reach pt. Left a detail message for pt that we are checking up on her and if she is still not feeling better, to give [place] a call.

## 2023-09-07 DIAGNOSIS — L570 Actinic keratosis: Secondary | ICD-10-CM | POA: Diagnosis not present

## 2023-09-07 DIAGNOSIS — Z86018 Personal history of other benign neoplasm: Secondary | ICD-10-CM | POA: Diagnosis not present

## 2023-09-07 DIAGNOSIS — L821 Other seborrheic keratosis: Secondary | ICD-10-CM | POA: Diagnosis not present

## 2023-09-07 DIAGNOSIS — L4059 Other psoriatic arthropathy: Secondary | ICD-10-CM | POA: Diagnosis not present

## 2023-09-07 DIAGNOSIS — L814 Other melanin hyperpigmentation: Secondary | ICD-10-CM | POA: Diagnosis not present

## 2023-09-07 DIAGNOSIS — L820 Inflamed seborrheic keratosis: Secondary | ICD-10-CM | POA: Diagnosis not present

## 2023-09-07 DIAGNOSIS — Z85828 Personal history of other malignant neoplasm of skin: Secondary | ICD-10-CM | POA: Diagnosis not present

## 2023-09-14 ENCOUNTER — Other Ambulatory Visit: Payer: Self-pay | Admitting: Family

## 2023-09-28 DIAGNOSIS — F3181 Bipolar II disorder: Secondary | ICD-10-CM | POA: Diagnosis not present

## 2023-09-28 DIAGNOSIS — F419 Anxiety disorder, unspecified: Secondary | ICD-10-CM | POA: Diagnosis not present

## 2023-09-28 DIAGNOSIS — G4700 Insomnia, unspecified: Secondary | ICD-10-CM | POA: Diagnosis not present

## 2023-09-29 ENCOUNTER — Other Ambulatory Visit: Payer: Self-pay

## 2023-10-12 NOTE — Progress Notes (Unsigned)
 No chief complaint on file.   HPI: [name]  58 y.o. comes in today for Preventive Health Care visit   Thyroid  Psoriatic arthritis  Depresision Hx of gastric ulcer  HT GLP1 gip  sleep Health Maintenance  Topic Date Due   HIV Screening  Never done   DTaP/Tdap/Td (3 - Td or Tdap) 09/08/2021   MAMMOGRAM  02/06/2023   INFLUENZA VACCINE  03/12/2023   COVID-19 Vaccine (5 - 2024-25 season) 04/12/2023   Cervical Cancer Screening (HPV/Pap Cotest)  04/24/2026   Colonoscopy  11/18/2032   Hepatitis C Screening  Completed   Zoster Vaccines- Shingrix  Completed   HPV VACCINES  Aged Out   Health Maintenance Review LIFESTYLE:  Exercise:   Tobacco/ETS: Alcohol:  Sugar beverages: Sleep: Drug use: no HH of  Work:    ROS:  GEN/ HEENT: No fever, significant weight changes sweats headaches vision problems hearing changes, CV/ PULM; No chest pain shortness of breath cough, syncope,edema  change in exercise tolerance. GI /GU: No adominal pain, vomiting, change in bowel habits. No blood in the stool. No significant GU symptoms. SKIN/HEME: ,no acute skin rashes suspicious lesions or bleeding. No lymphadenopathy, nodules, masses.  NEURO/ PSYCH:  No neurologic signs such as weakness numbness. No depression anxiety. IMM/ Allergy: No unusual infections.  Allergy .   REST of 12 system review negative except as per HPI   Past Medical History:  Diagnosis Date   ADD (attention deficit disorder)    poss   Depression    Mood disorder (HCC)    Psoriasis    Psoriatic arthritis (HCC) 10/2012    Past Surgical History:  Procedure Laterality Date   ABDOMINOPLASTY  2002   BUNIONECTOMY     bone spur left foot   ENDOMETRIAL ABLATION  2015   UPPER GI ENDOSCOPY  06/16/2018   see report    Family History  Problem Relation Age of Onset   Other Mother    Myasthenia gravis Mother    Diabetes Father    Glaucoma Father    Psoriasis Sister    Depression Sister    Other  Brother        optic disease NMO neuromyelitis optica   Hashimoto's thyroiditis Daughter    Anxiety disorder Other    Arthritis Other        parents     Social History   Socioeconomic History   Marital status: Married    Spouse name: Not on file   Number of children: Not on file   Years of education: Not on file   Highest education level: Not on file  Occupational History   Not on file  Tobacco Use   Smoking status: Never   Smokeless tobacco: Never  Vaping Use   Vaping status: Never Used  Substance and Sexual Activity   Alcohol use: Yes    Comment: Socially    Drug use: No   Sexual activity: Yes  Other Topics Concern   Not on file  Social History Narrative    Married   No caffeine   Managing HH   Of 4    Pet dog   Regular exercise- yes  Not much in winter    BA degree      Social Drivers of Health   Financial Resource Strain: Not on file  Food Insecurity: Not on file  Transportation Needs: Not on file  Physical Activity: Not on file  Stress: Not on file  Social  Connections: Unknown (12/22/2021)   Received from [facility]   Social Network    Social Network: Not on file    Outpatient Medications Prior to Visit  Medication Sig Dispense Refill   amLODipine (NORVASC) 2.5 MG tablet TAKE 1 TABLET(2.5 MG) BY MOUTH DAILY 90 tablet 1   HYDROcodone-acetaminophen (NORCO/VICODIN) 5-325 MG tablet Take 1 tablet by mouth every 4 (four) hours as needed for moderate pain (pain score 4-6) or severe pain (pain score 7-10) (for rib fracture). 15 tablet 0   lamoTRIgine (LAMICTAL) 200 MG tablet Take 2 tablets by mouth daily.  4   losartan (COZAAR) 100 MG tablet Take 1 tablet (100 mg total) by mouth daily. 90 tablet 2   NON FORMULARY L-methylfolate 15mg ; QD for depression     Omega 3 1000 MG CAPS Take by mouth.     thiamine (VITAMIN B1) 100 MG tablet Take 100 mg by mouth daily. QD for prevention of problems on alcohol     tirzepatide (MOUNJARO) 2.5 MG/0.5ML Pen  Inject 2.5 mg into the skin once a week. 2 mL 1   traZODone (DESYREL) 50 MG tablet Take 50-150 mg by mouth at bedtime as needed for sleep.   3   TREMFYA 100 MG/ML pen 100mg  Subcutaneous week 0, 4, then every 8 weeks for 56 days     zolpidem (AMBIEN) 5 MG tablet Take 1 tablet (5 mg total) by mouth at bedtime as needed for sleep. 15 tablet 0   No facility-administered medications prior to visit.     EXAM:  There were no vitals taken for this visit.  There is no height or weight on file to calculate BMI. Wt Readings from Last 3 Encounters:  07/01/23 149 lb (67.6 kg)  03/18/23 149 lb 6.4 oz (67.8 kg)  11/04/22 153 lb (69.4 kg)   Wt Readings from Last 3 Encounters:  07/01/23 149 lb (67.6 kg)  03/18/23 149 lb 6.4 oz (67.8 kg)  11/04/22 153 lb (69.4 kg)    Physical Exam: Vital signs reviewed NWG:NFAO is a well-developed well-nourished alert cooperative    who appearsr stated age in no acute distress.  HEENT: normocephalic atraumatic , Eyes: PERRL EOM's full, conjunctiva clear, Nares: paten,t no deformity discharge or tenderness., Ears: no deformity EAC's clear TMs with normal landmarks. Mouth: clear OP, no lesions, edema.  Moist mucous membranes. Dentition in adequate repair. NECK: supple without masses, thyromegaly or bruits. CHEST/PULM:  Clear to auscultation and percussion breath sounds equal no wheeze , rales or rhonchi. No chest wall deformities or tenderness. Breast: normal by inspection . No dimpling, discharge, masses, tenderness or discharge . CV: PMI is nondisplaced, S1 S2 no gallops, murmurs, rubs. Peripheral pulses are full without delay.No JVD .  ABDOMEN: Bowel sounds normal nontender  No guard or rebound, no hepato splenomegal no CVA tenderness.  No hernia. Extremtities:  No clubbing cyanosis or edema, no acute joint swelling or redness no focal atrophy NEURO:  Oriented x3, cranial nerves 3-12 appear to be intact, no obvious focal weakness,gait within normal limits no  abnormal reflexes or asymmetrical SKIN: No acute rashes normal turgor, color, no bruising or petechiae. PSYCH: Oriented, good eye contact, no obvious depression anxiety, cognition and judgment appear normal. LN: no cervical axillary inguinal adenopathy  Lab Results  Component Value Date   WBC 8.1 11/04/2022   HGB 10.4 (L) 11/04/2022   HCT 32.0 (L) 11/04/2022   PLT 312.0 11/04/2022   GLUCOSE 92 11/04/2022   CHOL 231 (H) 11/04/2022  TRIG 35.0 11/04/2022   HDL 152.40 11/04/2022   LDLDIRECT 51.0 09/16/2012   LDLCALC 71 11/04/2022   ALT 11 11/04/2022   AST 16 11/04/2022   NA 135 11/04/2022   K 4.1 11/04/2022   CL 100 11/04/2022   CREATININE 0.68 11/04/2022   BUN 11 11/04/2022   CO2 26 11/04/2022   TSH 0.36 11/04/2022   HGBA1C 5.3 11/04/2022    BP Readings from Last 3 Encounters:  03/18/23 (!) 148/92  11/04/22 138/86  06/24/22 (!) 150/94    Lab plan  reviewed with patient   ASSESSMENT AND PLAN:  Discussed the following assessment and plan:  No diagnosis found. No follow-ups on file.  Patient Care Team: Tawny Raspberry, Neta Mends, MD as PCP - Jerelene Redden, MD as PCP - Cardiology (Cardiology) Cherly Hensen, MD (Psychiatry) Charna Elizabeth, MD as Consulting Physician (Gastroenterology) There are no Patient Instructions on file for this visit.  Neta Mends. Phelan Goers M.D.

## 2023-10-13 ENCOUNTER — Encounter: Payer: Self-pay | Admitting: Internal Medicine

## 2023-10-13 ENCOUNTER — Ambulatory Visit (INDEPENDENT_AMBULATORY_CARE_PROVIDER_SITE_OTHER): Payer: BC Managed Care – PPO | Admitting: Internal Medicine

## 2023-10-13 VITALS — BP 130/88 | HR 85 | Temp 98.0°F | Ht 66.2 in | Wt 159.2 lb

## 2023-10-13 DIAGNOSIS — E66811 Obesity, class 1: Secondary | ICD-10-CM

## 2023-10-13 DIAGNOSIS — Z0000 Encounter for general adult medical examination without abnormal findings: Secondary | ICD-10-CM

## 2023-10-13 DIAGNOSIS — Z6831 Body mass index (BMI) 31.0-31.9, adult: Secondary | ICD-10-CM

## 2023-10-13 DIAGNOSIS — Z79899 Other long term (current) drug therapy: Secondary | ICD-10-CM

## 2023-10-13 DIAGNOSIS — I10 Essential (primary) hypertension: Secondary | ICD-10-CM | POA: Diagnosis not present

## 2023-10-13 DIAGNOSIS — E785 Hyperlipidemia, unspecified: Secondary | ICD-10-CM

## 2023-10-13 DIAGNOSIS — L4050 Arthropathic psoriasis, unspecified: Secondary | ICD-10-CM

## 2023-10-13 DIAGNOSIS — Z23 Encounter for immunization: Secondary | ICD-10-CM | POA: Diagnosis not present

## 2023-10-13 DIAGNOSIS — E0590 Thyrotoxicosis, unspecified without thyrotoxic crisis or storm: Secondary | ICD-10-CM | POA: Diagnosis not present

## 2023-10-13 MED ORDER — TIRZEPATIDE 5 MG/0.5ML ~~LOC~~ SOAJ: 5.0000 mg | SUBCUTANEOUS | 3 refills | Status: DC

## 2023-10-13 MED ORDER — LOSARTAN POTASSIUM 100 MG PO TABS: 100.0000 mg | ORAL_TABLET | Freq: Every day | ORAL | 2 refills | Status: DC

## 2023-10-13 NOTE — Patient Instructions (Addendum)
 Go back on losartan   WILL SEND IN REFILL Losartan  Check bp readings to ensure at goal average 130/80 and below.  Can try inc ose mounharo as planned Plan 3 mos fu virtual of in person or depending on lab  Get appt for fasting lab

## 2023-10-14 ENCOUNTER — Other Ambulatory Visit

## 2023-10-19 ENCOUNTER — Other Ambulatory Visit (INDEPENDENT_AMBULATORY_CARE_PROVIDER_SITE_OTHER)

## 2023-10-19 DIAGNOSIS — E0590 Thyrotoxicosis, unspecified without thyrotoxic crisis or storm: Secondary | ICD-10-CM

## 2023-10-19 DIAGNOSIS — L4050 Arthropathic psoriasis, unspecified: Secondary | ICD-10-CM

## 2023-10-19 DIAGNOSIS — I10 Essential (primary) hypertension: Secondary | ICD-10-CM

## 2023-10-19 DIAGNOSIS — Z79899 Other long term (current) drug therapy: Secondary | ICD-10-CM

## 2023-10-19 DIAGNOSIS — Z0000 Encounter for general adult medical examination without abnormal findings: Secondary | ICD-10-CM

## 2023-10-19 DIAGNOSIS — E785 Hyperlipidemia, unspecified: Secondary | ICD-10-CM | POA: Diagnosis not present

## 2023-10-19 LAB — COMPREHENSIVE METABOLIC PANEL
ALT: 11 U/L (ref 0–35)
AST: 16 U/L (ref 0–37)
Albumin: 4.2 g/dL (ref 3.5–5.2)
Alkaline Phosphatase: 73 U/L (ref 39–117)
BUN: 8 mg/dL (ref 6–23)
CO2: 27 meq/L (ref 19–32)
Calcium: 9.2 mg/dL (ref 8.4–10.5)
Chloride: 104 meq/L (ref 96–112)
Creatinine, Ser: 0.71 mg/dL (ref 0.40–1.20)
GFR: 93.11 mL/min (ref >60.00)
Glucose, Bld: 91 mg/dL (ref 70–99)
Potassium: 3.6 meq/L (ref 3.5–5.1)
Sodium: 138 meq/L (ref 135–145)
Total Bilirubin: 0.3 mg/dL (ref 0.2–1.2)
Total Protein: 6.7 g/dL (ref 6.0–8.3)

## 2023-10-19 LAB — LIPID PANEL
Cholesterol: 216 mg/dL — ABNORMAL HIGH (ref 0–200)
HDL: 126.7 mg/dL (ref >39.00)
LDL Cholesterol: 80 mg/dL (ref 0–99)
NonHDL: 89.68
Total CHOL/HDL Ratio: 2
Triglycerides: 50 mg/dL (ref 0.0–149.0)
VLDL: 10 mg/dL (ref 0.0–40.0)

## 2023-10-19 LAB — CBC WITH DIFFERENTIAL/PLATELET
Basophils Absolute: 0.1 10*3/uL (ref 0.0–0.1)
Basophils Relative: 2.3 % (ref 0.0–3.0)
Eosinophils Absolute: 0.3 10*3/uL (ref 0.0–0.7)
Eosinophils Relative: 7.2 % — ABNORMAL HIGH (ref 0.0–5.0)
HCT: 35.6 % — ABNORMAL LOW (ref 36.0–46.0)
Hemoglobin: 11.4 g/dL — ABNORMAL LOW (ref 12.0–15.0)
Lymphocytes Relative: 33.3 % (ref 12.0–46.0)
Lymphs Abs: 1.3 10*3/uL (ref 0.7–4.0)
MCHC: 32 g/dL (ref 30.0–36.0)
MCV: 84.1 fl (ref 78.0–100.0)
Monocytes Absolute: 0.4 10*3/uL (ref 0.1–1.0)
Monocytes Relative: 9.7 % (ref 3.0–12.0)
Neutro Abs: 1.9 10*3/uL (ref 1.4–7.7)
Neutrophils Relative %: 47.5 % (ref 43.0–77.0)
Platelets: 309 10*3/uL (ref 150.0–400.0)
RBC: 4.24 Mil/uL (ref 3.87–5.11)
RDW: 16.8 % — ABNORMAL HIGH (ref 11.5–15.5)
WBC: 4 10*3/uL (ref 4.0–10.5)

## 2023-10-19 LAB — TSH: TSH: 0.43 u[IU]/mL (ref 0.35–5.50)

## 2023-10-19 LAB — HEMOGLOBIN A1C: Hgb A1c MFr Bld: 5.1 % (ref 4.6–6.5)

## 2023-10-20 ENCOUNTER — Encounter: Payer: Self-pay | Admitting: Internal Medicine

## 2023-10-20 NOTE — Progress Notes (Signed)
 Blood sugar is normal lipid panel favorable . Mild anemia is improved  Thyroid in range  sodium chemistry is normal  Form completed to be sent

## 2023-10-29 ENCOUNTER — Encounter: Payer: BC Managed Care – PPO | Admitting: Internal Medicine

## 2023-11-24 ENCOUNTER — Other Ambulatory Visit: Payer: Self-pay

## 2023-11-24 MED ORDER — AMLODIPINE BESYLATE 2.5 MG PO TABS: ORAL_TABLET | ORAL | 1 refills | Status: DC

## 2023-11-25 ENCOUNTER — Other Ambulatory Visit: Payer: Self-pay | Admitting: Internal Medicine

## 2023-11-25 DIAGNOSIS — Z1231 Encounter for screening mammogram for malignant neoplasm of breast: Secondary | ICD-10-CM

## 2023-11-27 ENCOUNTER — Ambulatory Visit (HOSPITAL_BASED_OUTPATIENT_CLINIC_OR_DEPARTMENT_OTHER)
Admission: RE | Admit: 2023-11-27 | Discharge: 2023-11-27 | Disposition: A | Discharge status: Home / Self Care | Source: Ambulatory Visit

## 2023-11-27 ENCOUNTER — Encounter (HOSPITAL_BASED_OUTPATIENT_CLINIC_OR_DEPARTMENT_OTHER): Payer: Self-pay | Admitting: Radiology

## 2023-11-27 DIAGNOSIS — Z1231 Encounter for screening mammogram for malignant neoplasm of breast: Secondary | ICD-10-CM | POA: Diagnosis not present

## 2023-12-09 ENCOUNTER — Ambulatory Visit (HOSPITAL_BASED_OUTPATIENT_CLINIC_OR_DEPARTMENT_OTHER): Admitting: Obstetrics & Gynecology

## 2023-12-10 ENCOUNTER — Ambulatory Visit (HOSPITAL_BASED_OUTPATIENT_CLINIC_OR_DEPARTMENT_OTHER)
Admission: RE | Admit: 2023-12-10 | Discharge: 2023-12-10 | Disposition: A | Discharge status: Home / Self Care | Source: Ambulatory Visit | Attending: Internal Medicine | Admitting: Internal Medicine

## 2023-12-10 DIAGNOSIS — M85851 Other specified disorders of bone density and structure, right thigh: Secondary | ICD-10-CM | POA: Diagnosis not present

## 2023-12-10 DIAGNOSIS — Z780 Asymptomatic menopausal state: Secondary | ICD-10-CM | POA: Diagnosis not present

## 2023-12-10 DIAGNOSIS — Z13820 Encounter for screening for osteoporosis: Secondary | ICD-10-CM | POA: Diagnosis not present

## 2023-12-14 ENCOUNTER — Encounter: Payer: Self-pay | Admitting: Internal Medicine

## 2023-12-14 NOTE — Progress Notes (Signed)
 Borderline low bone density  make sure adequate vit D  1000 I u usually enough   and weight bearing activity  Can repeat in 3-4 years

## 2023-12-22 ENCOUNTER — Encounter (HOSPITAL_BASED_OUTPATIENT_CLINIC_OR_DEPARTMENT_OTHER): Payer: Self-pay | Admitting: Obstetrics & Gynecology

## 2023-12-22 ENCOUNTER — Ambulatory Visit (HOSPITAL_BASED_OUTPATIENT_CLINIC_OR_DEPARTMENT_OTHER): Admitting: Obstetrics & Gynecology

## 2023-12-22 ENCOUNTER — Other Ambulatory Visit (HOSPITAL_COMMUNITY)
Admission: RE | Admit: 2023-12-22 | Discharge: 2023-12-22 | Disposition: A | Discharge status: Home / Self Care | Source: Ambulatory Visit | Attending: Obstetrics & Gynecology | Admitting: Obstetrics & Gynecology

## 2023-12-22 VITALS — BP 159/93 | HR 71 | Ht 66.2 in | Wt 156.4 lb

## 2023-12-22 DIAGNOSIS — E049 Nontoxic goiter, unspecified: Secondary | ICD-10-CM | POA: Diagnosis not present

## 2023-12-22 DIAGNOSIS — Z780 Asymptomatic menopausal state: Secondary | ICD-10-CM | POA: Diagnosis not present

## 2023-12-22 DIAGNOSIS — M25551 Pain in right hip: Secondary | ICD-10-CM | POA: Diagnosis not present

## 2023-12-22 DIAGNOSIS — Z01419 Encounter for gynecological examination (general) (routine) without abnormal findings: Secondary | ICD-10-CM | POA: Diagnosis not present

## 2023-12-22 DIAGNOSIS — Z124 Encounter for screening for malignant neoplasm of cervix: Secondary | ICD-10-CM

## 2023-12-22 DIAGNOSIS — M25552 Pain in left hip: Secondary | ICD-10-CM

## 2023-12-22 NOTE — Progress Notes (Unsigned)
 ANNUAL EXAM Patient name: Melissa Walters MRN [id number]  Date of birth: Jan 02, 1966 Chief Complaint:   Medication Refill (Pt here for medication refill and she would like to discuss hormonal changes )  History of Present Illness:   Melissa Walters is a 58 y.o. G5P4 Caucasian female being seen today for a routine annual exam.  Has been almost 3 years since she was here.  No vaginal bleeding.  She wants to consider HRT again.  Has lost weight since she was here last.  Did used HRT in 2022 but this didn't really help.  Was on estratest and progesterone .  Discussed consult with [facility] for possible compounded HRT.  No LMP recorded. Patient has had an ablation.   Last pap 2022. Results were: neg. Last mammogram: 11/2023. Results were: normal. Family h/o breast cancer: no Last colonoscopy: 2024. Results were: normal. F/u 5 years.       12/22/2023   10:29 AM 10/13/2023   10:16 AM 07/01/2023    3:59 PM 04/29/2022    8:46 AM 11/25/2021    1:34 PM  Depression screen PHQ 2/9  Decreased Interest 0 0 0 0 1  Down, Depressed, Hopeless 0 0 0 0 1  PHQ - 2 Score 0 0 0 0 2  Altered sleeping    2 3  Tired, decreased energy    0 2  Change in appetite    0 3  Feeling bad or failure about yourself     0 3  Trouble concentrating    1 2  Moving slowly or fidgety/restless    0 0  Suicidal thoughts    0 0  PHQ-9 Score    3 15  Difficult doing work/chores    Somewhat difficult Not difficult at all    Review of Systems:   Pertinent items are noted in HPI Denies any headaches, blurred vision, fatigue, shortness of breath, chest pain, abdominal pain, abnormal vaginal discharge/itching/odor/irritation, problems with periods, bowel movements, urination, or intercourse unless otherwise stated above. Pertinent History Reviewed:  Reviewed past medical,surgical, social and family history.  Reviewed problem list, medications and allergies. Physical Assessment:   Vitals:   12/22/23 1027  BP: (!) 159/93   Pulse: 71  Weight: 156 lb 6.4 oz (70.9 kg)  Height: 5' 6.2" (1.681 m)  Body mass index is 25.09 kg/m.        Physical Examination:   General appearance - well appearing, and in no distress  Mental status - alert, oriented to person, place, and time  Psych:  She has a normal mood and affect  Skin - warm and dry, normal color, no suspicious lesions noted  Chest - effort normal, all lung fields clear to auscultation bilaterally  Heart - normal rate and regular rhythm  Neck:  midline trachea, no thyromegaly or nodules  Breasts - breasts appear normal, no suspicious masses, no skin or nipple changes or  axillary nodes  Abdomen - soft, nontender, nondistended, no masses or organomegaly  Pelvic - VULVA: normal appearing vulva with no masses, tenderness or lesions  VAGINA: normal appearing vagina with normal color and discharge, no lesions  CERVIX: normal appearing cervix without discharge or lesions, no CMT  Thin prep pap is {Desc; done/not:10129} *** HR HPV cotesting  UTERUS: uterus is felt to be normal size, shape, consistency and nontender   ADNEXA: No adnexal masses or tenderness noted.  Rectal - normal rectal, good sphincter tone, no masses felt. Hemoccult: ***  Extremities:  No  swelling or varicosities noted  Chaperone present for exam  No results found for this or any previous visit (from the past 24 hours).  Assessment & Plan:  1) Well-Woman Exam  2) ***  Labs/procedures today: ***  Mammogram: {Mammo f/u:25212::"@ 60yo"}, or sooner if problems Colonoscopy: {TCS f/u:25213::"@ 60yo"}, or sooner if problems  Orders Placed This Encounter  Procedures   AMB referral to sports medicine    Meds: No orders of the defined types were placed in this encounter.   Follow-up: No follow-ups on file.  Lillian Rein, MD 12/22/2023 10:59 AM

## 2023-12-22 NOTE — Patient Instructions (Signed)
 [facility] [id number], [place], [place] [address] Phone: [phone]

## 2023-12-24 LAB — CYTOLOGY - PAP
Comment: NEGATIVE
Diagnosis: NEGATIVE
High risk HPV: NEGATIVE

## 2023-12-24 NOTE — Progress Notes (Unsigned)
    Melissa Walters D.[facility] [address] [place] [address] Phone: [phone]   Assessment and Plan:     There are no diagnoses linked to this encounter.  ***   Pertinent previous records reviewed include ***    Follow Up: ***     Subjective:   I, Melissa Walters, am serving as a Neurosurgeon for Doctor Melissa Walters  Chief Complaint: hip pain   HPI:   12/25/2023 Patient is a 58 year old female with hip pain. Patient states   Relevant Historical Information: ***  Additional pertinent review of systems negative.   Current Outpatient Medications:    amLODipine  (NORVASC ) 2.5 MG tablet, TAKE 1 TABLET(2.5 MG) BY MOUTH DAILY, Disp: 90 tablet, Rfl: 1   lamoTRIgine (LAMICTAL) 200 MG tablet, Take 2 tablets by mouth daily., Disp: , Rfl: 4   losartan  (COZAAR ) 100 MG tablet, Take 1 tablet (100 mg total) by mouth daily., Disp: 90 tablet, Rfl: 2   NON FORMULARY, L-methylfolate 15mg ; QD for depression, Disp: , Rfl:    Omega 3 1000 MG CAPS, Take by mouth., Disp: , Rfl:    thiamine (VITAMIN B1) 100 MG tablet, Take 100 mg by mouth daily. QD for prevention of problems on alcohol, Disp: , Rfl:    tirzepatide  (MOUNJARO ) 5 MG/0.5ML Pen, Inject 5 mg into the skin once a week., Disp: 6 mL, Rfl: 3   traZODone (DESYREL) 50 MG tablet, Take 50-150 mg by mouth at bedtime as needed for sleep. , Disp: , Rfl: 3   TREMFYA 100 MG/ML pen, 100mg  Subcutaneous week 0, 4, then every 8 weeks for 56 days, Disp: , Rfl:    zolpidem  (AMBIEN ) 5 MG tablet, Take 1 tablet (5 mg total) by mouth at bedtime as needed for sleep., Disp: 15 tablet, Rfl: 0   Objective:     There were no vitals filed for this visit.    There is no height or weight on file to calculate BMI.    Physical Exam:    ***   Electronically signed by:  Melissa Walters D.[facility] 7:42 AM 12/24/23

## 2023-12-25 ENCOUNTER — Ambulatory Visit (HOSPITAL_BASED_OUTPATIENT_CLINIC_OR_DEPARTMENT_OTHER)
Admission: RE | Admit: 2023-12-25 | Discharge: 2023-12-25 | Disposition: A | Discharge status: Home / Self Care | Source: Ambulatory Visit | Attending: Obstetrics & Gynecology | Admitting: Obstetrics & Gynecology

## 2023-12-25 ENCOUNTER — Ambulatory Visit (INDEPENDENT_AMBULATORY_CARE_PROVIDER_SITE_OTHER)

## 2023-12-25 ENCOUNTER — Ambulatory Visit (HOSPITAL_BASED_OUTPATIENT_CLINIC_OR_DEPARTMENT_OTHER): Payer: Self-pay | Admitting: Obstetrics & Gynecology

## 2023-12-25 ENCOUNTER — Ambulatory Visit (INDEPENDENT_AMBULATORY_CARE_PROVIDER_SITE_OTHER): Admitting: Sports Medicine

## 2023-12-25 VITALS — BP 130/78 | HR 68 | Ht 66.0 in | Wt 156.0 lb

## 2023-12-25 DIAGNOSIS — M7062 Trochanteric bursitis, left hip: Secondary | ICD-10-CM | POA: Diagnosis not present

## 2023-12-25 DIAGNOSIS — E049 Nontoxic goiter, unspecified: Secondary | ICD-10-CM | POA: Insufficient documentation

## 2023-12-25 DIAGNOSIS — E041 Nontoxic single thyroid nodule: Secondary | ICD-10-CM

## 2023-12-25 DIAGNOSIS — M25551 Pain in right hip: Secondary | ICD-10-CM | POA: Diagnosis not present

## 2023-12-25 DIAGNOSIS — M25552 Pain in left hip: Secondary | ICD-10-CM

## 2023-12-25 DIAGNOSIS — M7061 Trochanteric bursitis, right hip: Secondary | ICD-10-CM

## 2023-12-25 NOTE — Patient Instructions (Signed)
 Hip and knee HEP  3 week follow up

## 2023-12-31 ENCOUNTER — Ambulatory Visit: Payer: Self-pay | Admitting: Sports Medicine

## 2024-01-06 DIAGNOSIS — F419 Anxiety disorder, unspecified: Secondary | ICD-10-CM | POA: Diagnosis not present

## 2024-01-06 DIAGNOSIS — F3181 Bipolar II disorder: Secondary | ICD-10-CM | POA: Diagnosis not present

## 2024-01-06 DIAGNOSIS — G4700 Insomnia, unspecified: Secondary | ICD-10-CM | POA: Diagnosis not present

## 2024-01-13 NOTE — Progress Notes (Unsigned)
    Melissa Walters D.[facility] [address] [place] [address] Phone: [phone]   Assessment and Plan:     There are no diagnoses linked to this encounter.  ***   Pertinent previous records reviewed include ***    Follow Up: ***     Subjective:   I, Melissa Walters, am serving as a Neurosurgeon for Doctor Ulysees Gander   Chief Complaint: bilat hip pain    HPI:    12/25/2023 Patient is a 58 year old female with bilat hip pain. Patient states intermittent pain for the last 6 months that has gotten worse. No MOI. No meds for the pain. No radiating pain. Side sleeper. Pain is worse in the am and then gets better throughout the day. No numbness or tingling. Walking is okay. Feels it when she is driving.    01/14/2024 Patient states   Relevant Historical Information: History of gastric ulcer, psoriatic arthritis,    Additional pertinent review of systems negative.   Current Outpatient Medications:    amLODipine  (NORVASC ) 2.5 MG tablet, TAKE 1 TABLET(2.5 MG) BY MOUTH DAILY, Disp: 90 tablet, Rfl: 1   lamoTRIgine (LAMICTAL) 200 MG tablet, Take 2 tablets by mouth daily., Disp: , Rfl: 4   losartan  (COZAAR ) 100 MG tablet, Take 1 tablet (100 mg total) by mouth daily., Disp: 90 tablet, Rfl: 2   NON FORMULARY, L-methylfolate 15mg ; QD for depression, Disp: , Rfl:    Omega 3 1000 MG CAPS, Take by mouth., Disp: , Rfl:    thiamine (VITAMIN B1) 100 MG tablet, Take 100 mg by mouth daily. QD for prevention of problems on alcohol, Disp: , Rfl:    tirzepatide  (MOUNJARO ) 5 MG/0.5ML Pen, Inject 5 mg into the skin once a week., Disp: 6 mL, Rfl: 3   traZODone (DESYREL) 50 MG tablet, Take 50-150 mg by mouth at bedtime as needed for sleep. , Disp: , Rfl: 3   TREMFYA 100 MG/ML pen, 100mg  Subcutaneous week 0, 4, then every 8 weeks for 56 days, Disp: , Rfl:    zolpidem  (AMBIEN ) 5 MG tablet, Take 1 tablet (5 mg total) by mouth at bedtime as needed for sleep., Disp: 15  tablet, Rfl: 0   Objective:     There were no vitals filed for this visit.    There is no height or weight on file to calculate BMI.    Physical Exam:    ***   Electronically signed by:  Melissa Walters D.[facility] 7:46 AM 01/13/24

## 2024-01-14 ENCOUNTER — Ambulatory Visit (INDEPENDENT_AMBULATORY_CARE_PROVIDER_SITE_OTHER): Admitting: Sports Medicine

## 2024-01-14 VITALS — BP 118/82 | HR 80 | Ht 66.0 in | Wt 155.0 lb

## 2024-01-14 DIAGNOSIS — M7061 Trochanteric bursitis, right hip: Secondary | ICD-10-CM | POA: Diagnosis not present

## 2024-01-14 DIAGNOSIS — M7062 Trochanteric bursitis, left hip: Secondary | ICD-10-CM | POA: Diagnosis not present

## 2024-01-14 DIAGNOSIS — M25551 Pain in right hip: Secondary | ICD-10-CM

## 2024-01-14 DIAGNOSIS — M25552 Pain in left hip: Secondary | ICD-10-CM | POA: Diagnosis not present

## 2024-01-14 NOTE — Patient Instructions (Signed)
 Tylenol  for day to day pain relief  Topical Voltaren  gel over areas of pain  Call if you would PT referral  Continue HEP  8 week follow up

## 2024-01-20 ENCOUNTER — Other Ambulatory Visit: Payer: Self-pay | Admitting: Surgery

## 2024-01-20 DIAGNOSIS — E042 Nontoxic multinodular goiter: Secondary | ICD-10-CM | POA: Diagnosis not present

## 2024-01-20 DIAGNOSIS — E041 Nontoxic single thyroid nodule: Secondary | ICD-10-CM

## 2024-02-02 ENCOUNTER — Other Ambulatory Visit: Payer: Self-pay | Admitting: Internal Medicine

## 2024-02-02 ENCOUNTER — Ambulatory Visit (INDEPENDENT_AMBULATORY_CARE_PROVIDER_SITE_OTHER): Admitting: Internal Medicine

## 2024-02-02 VITALS — BP 150/110 | HR 92 | Temp 98.6°F | Ht 66.0 in | Wt 151.0 lb

## 2024-02-02 DIAGNOSIS — I10 Essential (primary) hypertension: Secondary | ICD-10-CM

## 2024-02-02 DIAGNOSIS — D509 Iron deficiency anemia, unspecified: Secondary | ICD-10-CM

## 2024-02-02 DIAGNOSIS — E785 Hyperlipidemia, unspecified: Secondary | ICD-10-CM

## 2024-02-02 DIAGNOSIS — Z6831 Body mass index (BMI) 31.0-31.9, adult: Secondary | ICD-10-CM

## 2024-02-02 DIAGNOSIS — E66811 Obesity, class 1: Secondary | ICD-10-CM

## 2024-02-02 DIAGNOSIS — Z79899 Other long term (current) drug therapy: Secondary | ICD-10-CM

## 2024-02-02 DIAGNOSIS — D649 Anemia, unspecified: Secondary | ICD-10-CM

## 2024-02-02 MED ORDER — TIRZEPATIDE 5 MG/0.5ML ~~LOC~~ SOAJ: 5.0000 mg | SUBCUTANEOUS | 3 refills | Status: DC

## 2024-02-02 MED ORDER — LOSARTAN POTASSIUM 50 MG PO TABS
50.0000 mg | ORAL_TABLET | Freq: Every day | ORAL | 1 refills | Status: AC
Start: 2024-02-02 — End: ?

## 2024-02-02 NOTE — Progress Notes (Signed)
 Chief Complaint  Patient presents with   Medical Management of Chronic Issues    Pt is here for 3 months f/u. Pt reports her BP has been running high lately. Pt states pcp took her off of losartan .     HPI: Melissa Walters 58 y.o. come in for Chronic disease management  med check  BP  up today  had been on losartan  100 but had low bp  and so was stopped  Life chaotic and stresses and travel.   Taking mounjaro  about every 2 weeks  to maintain  weigh t loss and continues strength exercises  Activity peleton and liufitn  Psoriatic condition much better on tremfya   BH issues stable despite  Take multi vit   no bleeding  hx of anemia  Evaluation for thyroid  nodule  to get FNB soon  ROS: See pertinent positives and negatives per HPI. Takes vit d centrum silver  Past Medical History:  Diagnosis Date   ADD (attention deficit disorder)    poss   Depression    Mood disorder (HCC)    Psoriasis    Psoriatic arthritis (HCC) 10/2012    Family History  Problem Relation Age of Onset   Other Mother    Myasthenia gravis Mother    Diabetes Father    Glaucoma Father    Psoriasis Sister    Depression Sister    Other Brother        optic disease NMO neuromyelitis optica   Hashimoto's thyroiditis Daughter    Anxiety disorder Other    Arthritis Other        parents     Social History   Socioeconomic History   Marital status: Married    Spouse name: Not on file   Number of children: Not on file   Years of education: Not on file   Highest education level: Bachelor's degree (e.g., BA, AB, BS)  Occupational History   Not on file  Tobacco Use   Smoking status: Never   Smokeless tobacco: Never  Vaping Use   Vaping status: Never Used  Substance and Sexual Activity   Alcohol use: Yes    Comment: Socially    Drug use: No   Sexual activity: Yes  Other Topics Concern   Not on file  Social History Narrative    Married   No caffeine   Managing HH   Of 4    Pet dog   Regular  exercise- yes  Not much in winter    BA degree      Social Drivers of Health   Financial Resource Strain: Low Risk  (02/02/2024)   Overall Financial Resource Strain (CARDIA)    Difficulty of Paying Living Expenses: Not hard at all  Food Insecurity: No Food Insecurity (02/02/2024)   Hunger Vital Sign    Worried About Running Out of Food in the Last Year: Never true    Ran Out of Food in the Last Year: Never true  Transportation Needs: No Transportation Needs (02/02/2024)   PRAPARE - Administrator, Civil Service (Medical): No    Lack of Transportation (Non-Medical): No  Physical Activity: Sufficiently Active (02/02/2024)   Exercise Vital Sign    Days of Exercise per Week: 4 days    Minutes of Exercise per Session: 40 min  Stress: Stress Concern Present (02/02/2024)   [employer] of Occupational Health - Occupational Stress Questionnaire    Feeling of Stress: Very much  Social Connections: Socially  Integrated (02/02/2024)   Social Connection and Isolation Panel    Frequency of Communication with Friends and Family: More than three times a week    Frequency of Social Gatherings with Friends and Family: Three times a week    Attends Religious Services: More than 4 times per year    Active Member of Clubs or Organizations: Yes    Attends Banker Meetings: More than 4 times per year    Marital Status: Married    Outpatient Medications Prior to Visit  Medication Sig Dispense Refill   amLODipine  (NORVASC ) 2.5 MG tablet TAKE 1 TABLET(2.5 MG) BY MOUTH DAILY 90 tablet 1   lamoTRIgine (LAMICTAL) 200 MG tablet Take 2 tablets by mouth daily.  4   NON FORMULARY L-methylfolate 15mg ; QD for depression     Omega 3 1000 MG CAPS Take by mouth.     thiamine (VITAMIN B1) 100 MG tablet Take 100 mg by mouth daily. QD for prevention of problems on alcohol     traZODone (DESYREL) 50 MG tablet Take 50-150 mg by mouth at bedtime as needed for sleep.   3   TREMFYA 100 MG/ML pen  100mg  Subcutaneous week 0, 4, then every 8 weeks for 56 days     zolpidem  (AMBIEN ) 5 MG tablet Take 1 tablet (5 mg total) by mouth at bedtime as needed for sleep. 15 tablet 0   tirzepatide  (MOUNJARO ) 5 MG/0.5ML Pen Inject 5 mg into the skin once a week. 6 mL 3   losartan  (COZAAR ) 100 MG tablet Take 1 tablet (100 mg total) by mouth daily. (Patient not taking: Reported on 02/02/2024) 90 tablet 2   No facility-administered medications prior to visit.     EXAM:  BP (!) 150/110 (BP Location: Left Arm, Patient Position: Sitting, Cuff Size: Normal)   Pulse 92   Temp 98.6 F (37 C) (Oral)   Ht 5' 6 (1.676 m)   Wt 151 lb (68.5 kg)   SpO2 96%   BMI 24.37 kg/m   Body mass index is 24.37 kg/m. BP Readings from Last 3 Encounters:  02/02/24 (!) 150/110  01/14/24 118/82  12/25/23 130/78    GENERAL: vitals reviewed and listed above, alert, oriented, appears well hydrated and in no acute distress HEENT: atraumatic, conjunctiva  clear, no obvious abnormalities on inspection of external nose and ears  NECK: no obvious masses on inspection palpation  LUNGS: clear to auscultation bilaterally, no wheezes, rales or rhonchi, good air movement CV: HRRR, no clubbing cyanosis or  peripheral edema nl cap refill  MS: moves all extremities without noticeable focal  abnormality PSYCH: pleasant and cooperative, no obvious depression or anxiety Lab Results  Component Value Date   WBC 4.0 10/19/2023   HGB 11.4 (L) 10/19/2023   HCT 35.6 (L) 10/19/2023   PLT 309.0 10/19/2023   GLUCOSE 91 10/19/2023   CHOL 216 (H) 10/19/2023   TRIG 50.0 10/19/2023   HDL 126.70 10/19/2023   LDLDIRECT 51.0 09/16/2012   LDLCALC 80 10/19/2023   ALT 11 10/19/2023   AST 16 10/19/2023   NA 138 10/19/2023   K 3.6 10/19/2023   CL 104 10/19/2023   CREATININE 0.71 10/19/2023   BUN 8 10/19/2023   CO2 27 10/19/2023   TSH 0.43 10/19/2023   HGBA1C 5.1 10/19/2023   BP Readings from Last 3 Encounters:  02/02/24 (!) 150/110   01/14/24 118/82  12/25/23 130/78    ASSESSMENT AND PLAN:  Discussed the following assessment and plan:  Essential hypertension  Medication management  Hyperlipidemia, unspecified hyperlipidemia type  Class 1 obesity with serious comorbidity and body mass index (BMI) of 31.0 to 31.9 in adult, unspecified obesity type  Iron deficiency anemia, unspecified iron deficiency anemia type Bp  control to optimize  get baseline readings and if elevated as expected  go back on 50 of losartan  per day  Plan lab in about a month to fu chem and anemia  ( hs been iron deficient in past but no active bleeding hx )   Fu depnding on lab results and bp control or 3 months  -Patient advised to return or notify health care team  if  new concerns arise.  Patient Instructions  Check bp readings twice a day for 3-5 days  and if elevated we can restart the losartan  at  50 mg per day .SABRA  Will give  losartan  rx  to start if not at goal.  Plan lab   in about a month  BMP cbcdiff   iron studies and b12 .  To fu. Dont have to fats.  Jylan Loeza K. Torie Priebe M.D.

## 2024-02-02 NOTE — Patient Instructions (Addendum)
 Check bp readings twice a day for 3-5 days  and if elevated we can restart the losartan  at  50 mg per day .SABRA  Will give  losartan  rx  to start if not at goal.  Plan lab   in about a month  BMP cbcdiff   iron studies and b12 .  To fu. Dont have to fats.

## 2024-02-03 ENCOUNTER — Other Ambulatory Visit (HOSPITAL_COMMUNITY): Payer: Self-pay

## 2024-02-03 ENCOUNTER — Telehealth: Payer: Self-pay

## 2024-02-03 ENCOUNTER — Ambulatory Visit
Admission: RE | Admit: 2024-02-03 | Discharge: 2024-02-03 | Disposition: A | Discharge status: Home / Self Care | Source: Ambulatory Visit | Attending: Surgery | Admitting: Surgery

## 2024-02-03 ENCOUNTER — Other Ambulatory Visit (HOSPITAL_COMMUNITY)
Admission: RE | Admit: 2024-02-03 | Discharge: 2024-02-03 | Disposition: A | Discharge status: Home / Self Care | Source: Ambulatory Visit | Attending: Student | Admitting: Student

## 2024-02-03 DIAGNOSIS — E041 Nontoxic single thyroid nodule: Secondary | ICD-10-CM

## 2024-02-03 NOTE — Telephone Encounter (Signed)
 Highest A1C is 5.5  Ozempic /Mounjaro  is approved exclusively as an adjunct to diet and exercise to improve glycemic  control in adults with type 2 diabetes mellitus. A review of patient's medical chart reveals no  documented diagnosis of type 2 diabetes or an A1C indicative of diabetes. Therefore, they do not  currently meet the criteria for prior authorization of this medication. If clinically appropriate, alternative  options such as Saxenda, Zepbound , or Wegovy  may be considered for this patient.

## 2024-02-03 NOTE — Telephone Encounter (Signed)
 Patient is self paying  If written as zep bound ? Would this be more likely to be  be covered or less proce?

## 2024-02-05 LAB — CYTOLOGY - NON PAP

## 2024-02-08 ENCOUNTER — Ambulatory Visit: Payer: Self-pay | Admitting: Surgery

## 2024-02-08 NOTE — Progress Notes (Signed)
 USN guided FNA biopsy returned an inadequate amount of cells for evaluation.  I called patient and discussed the need to repeat the biopsy.  We will request the biopsy be done at the [facility] location this time.  I will contact the patient when the results are available.  [place] - please schedule patient for repeat biopsy and request [facility] location.  tmg  Krystal Spinner, MD [facility] practice Office: [phone]

## 2024-02-09 ENCOUNTER — Encounter: Payer: Self-pay | Admitting: Surgery

## 2024-02-10 ENCOUNTER — Other Ambulatory Visit: Payer: Self-pay | Admitting: Surgery

## 2024-02-10 DIAGNOSIS — E041 Nontoxic single thyroid nodule: Secondary | ICD-10-CM

## 2024-02-16 ENCOUNTER — Other Ambulatory Visit (HOSPITAL_COMMUNITY)
Admission: RE | Admit: 2024-02-16 | Discharge: 2024-02-16 | Disposition: A | Discharge status: Home / Self Care | Source: Ambulatory Visit | Attending: Surgery | Admitting: Surgery

## 2024-02-16 ENCOUNTER — Ambulatory Visit
Admission: RE | Admit: 2024-02-16 | Discharge: 2024-02-16 | Disposition: A | Discharge status: Home / Self Care | Source: Ambulatory Visit | Attending: Surgery | Admitting: Surgery

## 2024-02-16 ENCOUNTER — Inpatient Hospital Stay: Admission: RE | Admit: 2024-02-16 | Source: Ambulatory Visit

## 2024-02-16 DIAGNOSIS — E041 Nontoxic single thyroid nodule: Secondary | ICD-10-CM

## 2024-02-17 ENCOUNTER — Other Ambulatory Visit

## 2024-02-19 LAB — CYTOLOGY - NON PAP

## 2024-02-26 ENCOUNTER — Ambulatory Visit: Payer: Self-pay | Admitting: Surgery

## 2024-02-26 NOTE — Progress Notes (Signed)
 Unfortunately, this FNA biopsy also is inadequate for definitive cytologic interpretation.  This is the second attempt for this patient.  I will call her to discuss options going forward.  Krystal Spinner, MD [facility] practice Office: [phone]

## 2024-03-03 NOTE — Progress Notes (Signed)
 Telephone call to patient to discuss the cytopathology results from her second fine-needle aspiration biopsy.  Again, this shows scant follicular epithelium and is felt to be an adequate for evaluation, [place] category I.   Today we discussed further options for management.  We discussed performing a third attempt at fine-needle aspiration biopsy.  However there is no guarantee of success.  We discussed thyroid  surgery to remove the right thyroid  lobe and submitted for definitive diagnosis.  We discussed continued close observation with a follow-up ultrasound, TSH level, and physical examination in November 2025, which would be 6 months from her initial ultrasound which was performed in May.   After discussion, the patient would like to postpone any further interventions until November.  She will then undergo ultrasound and we will obtain a repeat TSH level.  I will see her after those studies for physical examination here in the office and to review the results.  We will make plans for further management at that time.   Krystal Spinner, MD [facility] Office: [phone]

## 2024-03-09 DIAGNOSIS — F1010 Alcohol abuse, uncomplicated: Secondary | ICD-10-CM | POA: Diagnosis not present

## 2024-03-09 DIAGNOSIS — G4700 Insomnia, unspecified: Secondary | ICD-10-CM | POA: Diagnosis not present

## 2024-03-09 DIAGNOSIS — F411 Generalized anxiety disorder: Secondary | ICD-10-CM | POA: Diagnosis not present

## 2024-03-09 DIAGNOSIS — F3181 Bipolar II disorder: Secondary | ICD-10-CM | POA: Diagnosis not present

## 2024-03-10 ENCOUNTER — Ambulatory Visit: Admitting: Sports Medicine

## 2024-03-10 DIAGNOSIS — L4059 Other psoriatic arthropathy: Secondary | ICD-10-CM | POA: Diagnosis not present

## 2024-03-10 DIAGNOSIS — L401 Generalized pustular psoriasis: Secondary | ICD-10-CM | POA: Diagnosis not present

## 2024-03-14 DIAGNOSIS — H524 Presbyopia: Secondary | ICD-10-CM | POA: Diagnosis not present

## 2024-03-14 DIAGNOSIS — H10013 Acute follicular conjunctivitis, bilateral: Secondary | ICD-10-CM | POA: Diagnosis not present

## 2024-03-21 ENCOUNTER — Encounter (HOSPITAL_BASED_OUTPATIENT_CLINIC_OR_DEPARTMENT_OTHER): Payer: Self-pay | Admitting: Obstetrics & Gynecology

## 2024-03-22 ENCOUNTER — Telehealth (HOSPITAL_BASED_OUTPATIENT_CLINIC_OR_DEPARTMENT_OTHER): Payer: Self-pay

## 2024-03-22 NOTE — Telephone Encounter (Signed)
 Left a message for Melissa Walters at [facility] requesting a return call to discuss treatment plan for patient.  Melissa Walters FALCON Odean [place] to P [facility] Clinical (supporting Ronal GORMAN Pinal, MD)  (Selected Message)  03/21/24  1:25 PM It's my understanding that the results of my hormone tests are complete and Melissa Walters from the [facility] has reached out with a recommended treatment plan.  What are your thoughts.  I would like to proceed as soon as possible.   Thank you!   Melissa Walters

## 2024-03-24 NOTE — Telephone Encounter (Signed)
 Spoke with the pharmacist from [facility], [place]. Melissa Walters states that she sent recommendations to Dr.Miller last week and prescriptions were sent over to the pharmacy on 03/18/24. The patient picked up the prescriptions on 8/12.  Left message for patient at number provided [phone]. Requested a return call to ensure she understands the prescriptions prescribed and to see if she has any additional questions.

## 2024-03-29 NOTE — Telephone Encounter (Signed)
 Left message to call Tieler Cournoyer at [phone].

## 2024-03-30 NOTE — Telephone Encounter (Signed)
 Spoke with patient. Patient states that she was able to pick up prescriptions from [facility] pharmacy. No questions or concerns at this time.

## 2024-05-19 ENCOUNTER — Other Ambulatory Visit: Payer: Self-pay | Admitting: Surgery

## 2024-05-19 DIAGNOSIS — E042 Nontoxic multinodular goiter: Secondary | ICD-10-CM

## 2024-05-30 DIAGNOSIS — L821 Other seborrheic keratosis: Secondary | ICD-10-CM | POA: Diagnosis not present

## 2024-05-30 DIAGNOSIS — L570 Actinic keratosis: Secondary | ICD-10-CM | POA: Diagnosis not present

## 2024-06-08 ENCOUNTER — Telehealth (INDEPENDENT_AMBULATORY_CARE_PROVIDER_SITE_OTHER): Admitting: Family Medicine

## 2024-06-08 ENCOUNTER — Ambulatory Visit: Payer: Self-pay

## 2024-06-08 ENCOUNTER — Encounter: Payer: Self-pay | Admitting: Family Medicine

## 2024-06-08 DIAGNOSIS — G4700 Insomnia, unspecified: Secondary | ICD-10-CM | POA: Diagnosis not present

## 2024-06-08 DIAGNOSIS — F411 Generalized anxiety disorder: Secondary | ICD-10-CM | POA: Diagnosis not present

## 2024-06-08 DIAGNOSIS — J0100 Acute maxillary sinusitis, unspecified: Secondary | ICD-10-CM | POA: Diagnosis not present

## 2024-06-08 DIAGNOSIS — F1010 Alcohol abuse, uncomplicated: Secondary | ICD-10-CM | POA: Diagnosis not present

## 2024-06-08 DIAGNOSIS — F3181 Bipolar II disorder: Secondary | ICD-10-CM | POA: Diagnosis not present

## 2024-06-08 MED ORDER — AMOXICILLIN-POT CLAVULANATE 875-125 MG PO TABS: 1.0000 | ORAL_TABLET | Freq: Two times a day (BID) | ORAL | 0 refills | Status: AC

## 2024-06-08 NOTE — Progress Notes (Signed)
 Patient ID: Melissa Walters, female   DOB: 1966/08/06, 58 y.o.   MRN: [id number]   Virtual Visit via Video Note  I connected with Melissa Walters on 06/08/24 at  1:30 PM EDT by a video enabled telemedicine application and verified that I am speaking with the correct person using two identifiers.  Location patient: home Location provider:work or home office Persons participating in the virtual visit: patient, provider  I discussed the limitations of evaluation and management by telemedicine and the availability of in person appointments. The patient expressed understanding and agreed to proceed.   HPI: Melissa Walters set up virtual visit to discuss some recent progressive left facial pain, congestion, malaise.  Symptoms started over a week ago.  Symptoms have been progressive.  Home COVID test negative x 2.  She denies any active headaches.  No fever.  Some nasal discharge.  No right-sided facial symptoms.  History of intolerance with Compazine but no known antibiotic allergies.   ROS: See pertinent positives and negatives per HPI.  Past Medical History:  Diagnosis Date   ADD (attention deficit disorder)    poss   Depression    Mood disorder    Psoriasis    Psoriatic arthritis (HCC) 10/2012    Past Surgical History:  Procedure Laterality Date   ABDOMINOPLASTY  2002   BUNIONECTOMY     bone spur left foot   CESAREAN SECTION     x 4   ENDOMETRIAL ABLATION  2015   UPPER GI ENDOSCOPY  06/16/2018   see report    Family History  Problem Relation Age of Onset   Other Mother    Myasthenia gravis Mother    Diabetes Father    Glaucoma Father    Psoriasis Sister    Depression Sister    Other Brother        optic disease NMO neuromyelitis optica   Hashimoto's thyroiditis Daughter    Anxiety disorder Other    Arthritis Other        parents     SOCIAL HX: Non-smoker   Current Outpatient Medications:    amLODipine  (NORVASC ) 2.5 MG tablet, TAKE 1 TABLET(2.5 MG) BY MOUTH DAILY, Disp:  90 tablet, Rfl: 1   amoxicillin -clavulanate (AUGMENTIN) 875-125 MG tablet, Take 1 tablet by mouth 2 (two) times daily., Disp: 20 tablet, Rfl: 0   lamoTRIgine (LAMICTAL) 200 MG tablet, Take 2 tablets by mouth daily., Disp: , Rfl: 4   losartan  (COZAAR ) 50 MG tablet, Take 1 tablet (50 mg total) by mouth daily., Disp: 90 tablet, Rfl: 1   NON FORMULARY, L-methylfolate 15mg ; QD for depression, Disp: , Rfl:    Omega 3 1000 MG CAPS, Take by mouth., Disp: , Rfl:    thiamine (VITAMIN B1) 100 MG tablet, Take 100 mg by mouth daily. QD for prevention of problems on alcohol, Disp: , Rfl:    tirzepatide  (MOUNJARO ) 5 MG/0.5ML Pen, Inject 5 mg into the skin once a week., Disp: 6 mL, Rfl: 3   traZODone (DESYREL) 50 MG tablet, Take 50-150 mg by mouth at bedtime as needed for sleep. , Disp: , Rfl: 3   TREMFYA 100 MG/ML pen, 100mg  Subcutaneous week 0, 4, then Walters 8 weeks for 56 days, Disp: , Rfl:    zolpidem  (AMBIEN ) 5 MG tablet, Take 1 tablet (5 mg total) by mouth at bedtime as needed for sleep., Disp: 15 tablet, Rfl: 0  EXAM:  VITALS per patient if applicable:  GENERAL: alert, oriented, appears well and in no acute distress  HEENT: atraumatic, conjunttiva clear, no obvious abnormalities on inspection of external nose and ears  NECK: normal movements of the head and neck  LUNGS: on inspection no signs of respiratory distress, breathing rate appears normal, no obvious gross SOB, gasping or wheezing  CV: no obvious cyanosis  MS: moves all visible extremities without noticeable abnormality  PSYCH/NEURO: pleasant and cooperative, no obvious depression or anxiety, speech and thought processing grossly intact  ASSESSMENT AND PLAN:  Discussed the following assessment and plan:  Probable acute left maxillary sinusitis.  Symptoms have been progressive.  We discussed starting amoxicillin  clavulanic acid 875 mg twice daily for 10 days.  Stay well-hydrated.  Follow-up for any persistent or worsening symptoms     I discussed the assessment and treatment plan with the patient. The patient was provided an opportunity to ask questions and all were answered. The patient agreed with the plan and demonstrated an understanding of the instructions.   The patient was advised to call back or seek an in-person evaluation if the symptoms worsen or if the condition fails to improve as anticipated.     Wolm Scarlet, MD

## 2024-06-08 NOTE — Telephone Encounter (Signed)
 FYI Only or Action Required?: FYI only for provider: appointment scheduled on 10/29.  Patient was last seen in primary care on 02/02/2024 by Panosh, Apolinar POUR, MD.  Called Nurse Triage reporting Facial Pain.  Symptoms began several days ago.  Interventions attempted: Nothing.  Symptoms are: stable.  Triage Disposition: Home Care  Patient/caregiver understands and will follow disposition?: Yes Reason for Disposition  [1] Sinus congestion as part of a cold AND [2] present < 10 days  Answer Assessment - Initial Assessment Questions Covid test negative x2. Has not taken anything OTC.  1. LOCATION: Where does it hurt?      Left side of nostril / face and eye  2. ONSET: When did the sinus pain start?  (e.g., hours, days)      Thursday  3. SEVERITY: How bad is the pain?   (Scale 0-10; or none, mild, moderate or severe)     Mild to moderate  4. NASAL CONGESTION: Is the nose blocked? If Yes, ask: Can you open it or must you breathe through your mouth?     Just one nostril blocked  5. NASAL DISCHARGE: Do you have discharge from your nose? If so ask, What color?     Greenish  6. FEVER: Do you have a fever? If Yes, ask: What is it, how was it measured, and when did it start?      Denies  7. OTHER SYMPTOMS: Do you have any other symptoms? (e.g., sore throat, cough, earache, difficulty breathing)     Scratchy throat  Protocols used: Sinus Pain or Congestion-A-AH  Copied from CRM #[id number]. Topic: Clinical - Red Word Triage >> Jun 08, 2024  9:56 AM Robinson H wrote: [facility] that prompted transfer to Nurse Triage: Sinus pain and pressure, congestion, negative covid test done twice

## 2024-06-09 NOTE — Telephone Encounter (Signed)
 Pt was seen yesterday with Dr. Caryl Never.

## 2024-06-13 ENCOUNTER — Ambulatory Visit
Admission: RE | Admit: 2024-06-13 | Discharge: 2024-06-13 | Disposition: A | Discharge status: Home / Self Care | Source: Ambulatory Visit | Attending: Surgery | Admitting: Surgery

## 2024-06-13 DIAGNOSIS — E042 Nontoxic multinodular goiter: Secondary | ICD-10-CM

## 2024-06-13 DIAGNOSIS — E041 Nontoxic single thyroid nodule: Secondary | ICD-10-CM | POA: Diagnosis not present

## 2024-06-30 ENCOUNTER — Other Ambulatory Visit (HOSPITAL_COMMUNITY): Payer: Self-pay

## 2024-06-30 ENCOUNTER — Telehealth: Payer: Self-pay

## 2024-06-30 ENCOUNTER — Other Ambulatory Visit: Payer: Self-pay | Admitting: Internal Medicine

## 2024-06-30 NOTE — Telephone Encounter (Signed)
 Ozempic /Mounjaro  is approved exclusively as an adjunct to diet and exercise to improve glycemic  control in adults with type 2 diabetes mellitus. A review of patient's medical chart reveals no  documented diagnosis of type 2 diabetes or an A1C indicative of diabetes. Therefore, they do not  currently meet the criteria for prior authorization of this medication. If clinically appropriate, alternative  options such as Saxenda, Zepbound , or Wegovy  may be considered for this patient.   Per test claim:

## 2024-07-01 ENCOUNTER — Other Ambulatory Visit (HOSPITAL_COMMUNITY): Payer: Self-pay

## 2024-07-06 MED ORDER — TIRZEPATIDE 5 MG/0.5ML ~~LOC~~ SOAJ: 5.0000 mg | SUBCUTANEOUS | 3 refills | Status: AC

## 2024-07-06 NOTE — Telephone Encounter (Signed)
 Ok to  refill enough for 30 days x 3 worth of med

## 2024-07-06 NOTE — Telephone Encounter (Signed)
Rx sent. Pt is aware.  °

## 2024-07-13 DIAGNOSIS — G4700 Insomnia, unspecified: Secondary | ICD-10-CM | POA: Diagnosis not present

## 2024-07-13 DIAGNOSIS — F1010 Alcohol abuse, uncomplicated: Secondary | ICD-10-CM | POA: Diagnosis not present

## 2024-07-13 DIAGNOSIS — F3181 Bipolar II disorder: Secondary | ICD-10-CM | POA: Diagnosis not present

## 2024-07-13 DIAGNOSIS — F411 Generalized anxiety disorder: Secondary | ICD-10-CM | POA: Diagnosis not present

## 2024-08-03 ENCOUNTER — Other Ambulatory Visit (HOSPITAL_COMMUNITY): Payer: Self-pay

## 2024-08-10 ENCOUNTER — Telehealth: Payer: Self-pay

## 2024-08-10 NOTE — Progress Notes (Signed)
" ° °  08/10/2024  Patient ID: Melissa Walters, female   DOB: August 05, 1966, 58 y.o.   MRN: [id number]  Pharmacy Quality Measure Review  This patient is appearing on a report for being at risk of failing the Controlling Blood Pressure measure this calendar year.   Last documented BP 150/110 on 02/02/24  Spoke with patient via telephone. Reports she did not resume Losartan  but BP was elevated with readings. Plans to resume and scheduled lab visit for previously ordered blood work. Patient will reach out with any BP concerns sooner.  Jon VEAR Lindau, PharmD Clinical Pharmacist [phone]    "

## 2024-09-07 ENCOUNTER — Other Ambulatory Visit

## 2024-09-07 DIAGNOSIS — Z79899 Other long term (current) drug therapy: Secondary | ICD-10-CM | POA: Diagnosis not present

## 2024-09-07 DIAGNOSIS — D649 Anemia, unspecified: Secondary | ICD-10-CM | POA: Diagnosis not present

## 2024-09-07 DIAGNOSIS — D509 Iron deficiency anemia, unspecified: Secondary | ICD-10-CM | POA: Diagnosis not present

## 2024-09-07 DIAGNOSIS — I10 Essential (primary) hypertension: Secondary | ICD-10-CM

## 2024-09-07 LAB — BASIC METABOLIC PANEL WITH GFR
BUN: 11 mg/dL (ref 6–23)
CO2: 29 meq/L (ref 19–32)
Calcium: 9.3 mg/dL (ref 8.4–10.5)
Chloride: 104 meq/L (ref 96–112)
Creatinine, Ser: 0.69 mg/dL (ref 0.40–1.20)
GFR: 94.45 mL/min
Glucose, Bld: 92 mg/dL (ref 70–99)
Potassium: 3.9 meq/L (ref 3.5–5.1)
Sodium: 138 meq/L (ref 135–145)

## 2024-09-07 LAB — CBC WITH DIFFERENTIAL/PLATELET
Basophils Absolute: 0.1 10*3/uL (ref 0.0–0.1)
Basophils Relative: 2.2 % (ref 0.0–3.0)
Eosinophils Absolute: 0.4 10*3/uL (ref 0.0–0.7)
Eosinophils Relative: 9 % — ABNORMAL HIGH (ref 0.0–5.0)
HCT: 37.3 % (ref 36.0–46.0)
Hemoglobin: 12.4 g/dL (ref 12.0–15.0)
Lymphocytes Relative: 31.7 % (ref 12.0–46.0)
Lymphs Abs: 1.4 10*3/uL (ref 0.7–4.0)
MCHC: 33.1 g/dL (ref 30.0–36.0)
MCV: 87.2 fl (ref 78.0–100.0)
Monocytes Absolute: 0.4 10*3/uL (ref 0.1–1.0)
Monocytes Relative: 9.3 % (ref 3.0–12.0)
Neutro Abs: 2.2 10*3/uL (ref 1.4–7.7)
Neutrophils Relative %: 47.8 % (ref 43.0–77.0)
Platelets: 254 10*3/uL (ref 150.0–400.0)
RBC: 4.28 Mil/uL (ref 3.87–5.11)
RDW: 14.1 % (ref 11.5–15.5)
WBC: 4.6 10*3/uL (ref 4.0–10.5)

## 2024-09-08 ENCOUNTER — Ambulatory Visit: Payer: Self-pay | Admitting: Internal Medicine

## 2024-09-08 LAB — IBC + FERRITIN
Ferritin: 8.5 ng/mL — ABNORMAL LOW (ref 10.0–291.0)
Iron: 103 ug/dL (ref 42–145)
Saturation Ratios: 21.8 % (ref 20.0–50.0)
TIBC: 473.2 ug/dL — ABNORMAL HIGH (ref 250.0–450.0)
Transferrin: 338 mg/dL (ref 212.0–360.0)

## 2024-09-08 LAB — VITAMIN B12: Vitamin B-12: 425 pg/mL (ref 211–911)

## 2024-09-08 NOTE — Progress Notes (Signed)
 Iron still low  but  anemia is resolved  ( better)  B12 and chemistry normal range  We can follow up at next   upcoming visit

## 2024-11-01 ENCOUNTER — Encounter: Admitting: Internal Medicine
